# Patient Record
Sex: Female | Born: 1965
Health system: Southern US, Community
[De-identification: ages and names within clinical notes are randomized; demographics above are authoritative.]

## PROBLEM LIST (undated history)

## (undated) DIAGNOSIS — I10 Essential (primary) hypertension: Secondary | ICD-10-CM

## (undated) DIAGNOSIS — K641 Second degree hemorrhoids: Secondary | ICD-10-CM

## (undated) DIAGNOSIS — N179 Acute kidney failure, unspecified: Secondary | ICD-10-CM

## (undated) DIAGNOSIS — T7840XA Allergy, unspecified, initial encounter: Secondary | ICD-10-CM

## (undated) DIAGNOSIS — F17210 Nicotine dependence, cigarettes, uncomplicated: Secondary | ICD-10-CM

## (undated) HISTORY — PX: MASTECTOMY: SHX3

## (undated) HISTORY — DX: Nicotine dependence, cigarettes, uncomplicated: F17.210

## (undated) HISTORY — DX: Essential (primary) hypertension: I10

## (undated) HISTORY — DX: Second degree hemorrhoids: K64.1

## (undated) HISTORY — DX: Acute kidney failure, unspecified: N17.9

## (undated) HISTORY — DX: Allergy, unspecified, initial encounter: T78.40XA

---

## 1992-12-19 HISTORY — PX: TUBAL LIGATION: SHX77

## 2007-04-19 ENCOUNTER — Ambulatory Visit: Payer: Self-pay | Admitting: Family Medicine

## 2011-01-13 ENCOUNTER — Ambulatory Visit: Payer: Self-pay | Admitting: Unknown Physician Specialty

## 2013-06-10 ENCOUNTER — Ambulatory Visit: Payer: Self-pay | Admitting: Obstetrics and Gynecology

## 2013-07-19 LAB — HM PAP SMEAR: HM PAP: NORMAL

## 2013-07-19 LAB — HM MAMMOGRAPHY: HM MAMMO: NORMAL

## 2014-07-30 ENCOUNTER — Ambulatory Visit: Payer: Self-pay | Admitting: Obstetrics and Gynecology

## 2015-06-29 ENCOUNTER — Ambulatory Visit (INDEPENDENT_AMBULATORY_CARE_PROVIDER_SITE_OTHER): Payer: 59

## 2015-06-29 DIAGNOSIS — Z3042 Encounter for surveillance of injectable contraceptive: Secondary | ICD-10-CM | POA: Diagnosis not present

## 2015-06-29 MED ORDER — MEDROXYPROGESTERONE ACETATE 150 MG/ML IM SUSP
150.0000 mg | Freq: Once | INTRAMUSCULAR | Status: AC
  Administered 2015-06-29: 150 mg via INTRAMUSCULAR

## 2015-09-29 ENCOUNTER — Ambulatory Visit (INDEPENDENT_AMBULATORY_CARE_PROVIDER_SITE_OTHER): Payer: 59 | Admitting: Family Medicine

## 2015-09-29 DIAGNOSIS — Z3049 Encounter for surveillance of other contraceptives: Secondary | ICD-10-CM

## 2015-09-29 DIAGNOSIS — Z3042 Encounter for surveillance of injectable contraceptive: Secondary | ICD-10-CM

## 2015-09-29 MED ORDER — MEDROXYPROGESTERONE ACETATE 150 MG/ML IM SUSP
150.0000 mg | Freq: Once | INTRAMUSCULAR | Status: AC
  Administered 2015-09-29: 150 mg via INTRAMUSCULAR

## 2015-10-13 ENCOUNTER — Encounter: Payer: Self-pay | Admitting: Family Medicine

## 2015-10-13 ENCOUNTER — Telehealth: Payer: Self-pay | Admitting: Family Medicine

## 2015-10-13 ENCOUNTER — Ambulatory Visit (INDEPENDENT_AMBULATORY_CARE_PROVIDER_SITE_OTHER): Payer: 59 | Admitting: Family Medicine

## 2015-10-13 ENCOUNTER — Other Ambulatory Visit: Payer: Self-pay | Admitting: Family Medicine

## 2015-10-13 ENCOUNTER — Encounter: Payer: Self-pay | Admitting: Obstetrics and Gynecology

## 2015-10-13 VITALS — BP 138/90 | HR 94 | Temp 97.8°F | Resp 16 | Ht 68.0 in | Wt 134.4 lb

## 2015-10-13 DIAGNOSIS — Z3042 Encounter for surveillance of injectable contraceptive: Secondary | ICD-10-CM | POA: Diagnosis not present

## 2015-10-13 DIAGNOSIS — F17210 Nicotine dependence, cigarettes, uncomplicated: Secondary | ICD-10-CM | POA: Insufficient documentation

## 2015-10-13 DIAGNOSIS — I10 Essential (primary) hypertension: Secondary | ICD-10-CM

## 2015-10-13 DIAGNOSIS — Z8742 Personal history of other diseases of the female genital tract: Secondary | ICD-10-CM | POA: Insufficient documentation

## 2015-10-13 DIAGNOSIS — J309 Allergic rhinitis, unspecified: Secondary | ICD-10-CM | POA: Insufficient documentation

## 2015-10-13 NOTE — Telephone Encounter (Signed)
1) Call [facility] and see if they have any blood work on her, last PAP test results, last mammogram results.   2) Call [facility] and get last mammogram result report.

## 2015-10-13 NOTE — Progress Notes (Signed)
Name: Joy Ray   MRN: [id number]    DOB: [birth year]   Date:10/13/2015       Progress Note  Subjective  Chief Complaint  Chief Complaint  Patient presents with  . Hypertension    HPI  Joy Ray is a 49 year old female who was initially here for a physical exam however it was changed to a routine follow up visit as she was under the impression she did not need a PAP test (last done 07/2013) and mammogram (last done 2015 and she states [facility] told her she can do another one in 3 years). She reports having blood work done maybe through [facility] but I can not find records in [place] or [facility]. Otherwise she reports using her HTN medication Lisinopril 40mg  a day with no problems and is still using Depo Provera every 3 months with no problems. Down to smoking about 3-5 cigarettes a day.  Past Medical History  Diagnosis Date  . HTN, goal below 140/90   . Allergy   . Cigarette smoker     Patient Active Problem List   Diagnosis Date Noted  . Allergic rhinitis 10/13/2015  . Cigarette smoker 10/13/2015  . H/O abnormal cervical Papanicolaou smear 10/13/2015  . Hypertension goal BP (blood pressure) < 140/90 10/13/2015  . Encounter for surveillance of injectable contraceptive 10/13/2015    Social History  Substance Use Topics  . Smoking status: Current Every Day Smoker -- 0.50 packs/day    Types: Cigarettes  . Smokeless tobacco: Not on file     Comment: has smoked for more than 10 yrs  . Alcohol Use: 0.0 oz/week    0 Standard drinks or equivalent per week     Comment: occasional glass of wine     Current outpatient prescriptions:  .  lisinopril (PRINIVIL,ZESTRIL) 40 MG tablet, Take 1 tablet by mouth daily., Disp: , Rfl:  .  medroxyPROGESTERone (DEPO-PROVERA) 150 MG/ML injection, Inject 150 mg into the muscle every 3 (three) months., Disp: , Rfl:   Past Surgical History  Procedure Laterality Date  . Tubal ligation  1994    After G1P1001     Family History  Problem Relation Age of Onset  . Cancer Mother     breast    Allergies  Allergen Reactions  . Ciprofloxacin      Review of Systems  CONSTITUTIONAL: No significant weight changes, fever, chills, weakness or fatigue.  HEENT:  - Eyes: No visual changes.  - Ears: No auditory changes. No pain.  - Nose: No sneezing, congestion, runny nose. - Throat: No sore throat. No changes in swallowing. SKIN: No rash or itching.  CARDIOVASCULAR: No chest pain, chest pressure or chest discomfort. No palpitations or edema.  RESPIRATORY: No shortness of breath, cough or sputum.  GASTROINTESTINAL: No anorexia, nausea, vomiting. No changes in bowel habits. No abdominal pain or blood.  NEUROLOGICAL: No headache, dizziness, syncope, paralysis, ataxia, numbness or tingling in the extremities. No memory changes. No change in bowel or bladder control.  PSYCHIATRIC: No change in mood. No change in sleep pattern.  ENDOCRINOLOGIC: No reports of sweating, cold or heat intolerance. No polyuria or polydipsia.     Objective  BP 138/90 mmHg  Pulse 94  Temp(Src) 97.8 F (36.6 C) (Oral)  Resp 16  Ht 5\' 8"  (1.727 m)  Wt 134 lb 6.4 oz (60.963 kg)  BMI 20.44 kg/m2  SpO2 96% Body mass index is 20.44 kg/(m^2).  Physical Exam  Constitutional: Patient appears well-developed  and well-nourished. In no distress.  HEENT:  - Head: Normocephalic and atraumatic.  - Ears: Bilateral TMs gray, no erythema or effusion - Nose: Nasal mucosa moist - Mouth/Throat: Oropharynx is clear and moist. No tonsillar hypertrophy or erythema. No post nasal drainage.  - Eyes: Conjunctivae clear, EOM movements normal. PERRLA. No scleral icterus.  Neck: Normal range of motion. Neck supple. No JVD present. No thyromegaly present.  Cardiovascular: Normal rate, regular rhythm and normal heart sounds.  No murmur heard.  Pulmonary/Chest: Effort normal and breath sounds normal. No respiratory distress. Musculoskeletal:  Normal range of motion bilateral UE and LE, no joint effusions.  Psychiatric: Patient has a guarded mood and affect. Behavior is normal in office today. Judgment and thought content normal in office today.   Assessment & Plan  1. Hypertension goal BP (blood pressure) < [id number] Diastolic borderline but she is hesitant to start any more medication. If I can not find evidence of blood work done in the past 1 year I will order near future.  2. Encounter for surveillance of injectable contraceptive Doing well.

## 2015-10-13 NOTE — Telephone Encounter (Signed)
Contacted [facility] to see if they had any updated information on this patient but i was on hold for 16 minutes and was not able to speak with anyone. I will try again tomorrow.

## 2015-10-14 NOTE — Telephone Encounter (Signed)
Spoke with Joy Ray with [facility] to see if they had any recent labs, pap results or mammogram. She informed me that this patient had an appt on 09/20/15 to have her annual CPE that she cancelled and that she declined to have her PAP on 10/09/2014. She has had blood work on 09/03/13, 10/08/13, 10/21/13 & 05/22/14.  Joy Ray stated that she will fax a copy of her results to our office.

## 2015-10-15 NOTE — Telephone Encounter (Signed)
Spoke with Chanel at [facility] and she stated that this patient had a mammogram August of 2015. She was not able to print it off so she transferred me to HIM. I then spoke with Denman George and she said that I had to fax a her this patient's name and date of birth on our letterhead to [phone] in order for it to be released. The task was done as requested.

## 2015-10-16 ENCOUNTER — Telehealth: Payer: Self-pay | Admitting: Family Medicine

## 2015-10-16 NOTE — Telephone Encounter (Signed)
Clarification she had a PAP test 08/2013 (just got records from [facility]) so she should only need updated blood work and mammogram if she is okay with me ordering these give her number to [facility] to schedule her mammogram.

## 2015-10-16 NOTE — Telephone Encounter (Signed)
Please speak with patient and let her know that I recommend that she get updated blood work, mammogram and PAP testing done. I can order blood work and mammogram but she will have to return for PAP only office visit. Let me know if I can go ahead and order lab work and mammogram?  Last blood work: 05/22/14 Last pap unknown according to [place] office since she declined to have her PAP on 10/09/2014  Last mammogram: Aug 2015 (waiting on results.)

## 2015-10-16 NOTE — Telephone Encounter (Signed)
Tried to contact this patient to inform her that we contacted both [facility] and [facility] in which they verified that she is not up to date with her lab work, mammogram or PAP, but there was no answer. The phone rang continuously so I was not able to leave a message.  When she calls [place] back I will inform her that Dr. Allie Dimmer is requesting that she comes in for a PAP only visit and that she wants to know if she could put in an order for a mammogram & labs.

## 2015-10-19 NOTE — Telephone Encounter (Signed)
I have been unable to get in contact with this patient, but as soon as she returns my call, I will review Dr. Allie Dimmer message.

## 2015-12-30 ENCOUNTER — Other Ambulatory Visit: Payer: Self-pay

## 2015-12-30 ENCOUNTER — Ambulatory Visit (INDEPENDENT_AMBULATORY_CARE_PROVIDER_SITE_OTHER): Payer: 59

## 2015-12-30 ENCOUNTER — Ambulatory Visit: Payer: 59

## 2015-12-30 DIAGNOSIS — Z3049 Encounter for surveillance of other contraceptives: Secondary | ICD-10-CM

## 2015-12-30 MED ORDER — MEDROXYPROGESTERONE ACETATE 150 MG/ML IM SUSP
150.0000 mg | Freq: Once | INTRAMUSCULAR | Status: AC
  Administered 2015-12-30: 150 mg via INTRAMUSCULAR

## 2016-01-05 ENCOUNTER — Other Ambulatory Visit: Payer: Self-pay | Admitting: Family Medicine

## 2016-01-05 DIAGNOSIS — Z1231 Encounter for screening mammogram for malignant neoplasm of breast: Secondary | ICD-10-CM

## 2016-01-05 MED ORDER — MEDROXYPROGESTERONE ACETATE 150 MG/ML IM SUSP: 150.0000 mg | INTRAMUSCULAR | Status: DC

## 2016-01-13 ENCOUNTER — Ambulatory Visit
Admission: RE | Admit: 2016-01-13 | Discharge: 2016-01-13 | Disposition: A | Discharge status: Home / Self Care | Payer: 59 | Source: Ambulatory Visit | Attending: Family Medicine | Admitting: Family Medicine

## 2016-01-13 DIAGNOSIS — Z1231 Encounter for screening mammogram for malignant neoplasm of breast: Secondary | ICD-10-CM | POA: Diagnosis present

## 2016-01-19 ENCOUNTER — Encounter: Payer: 59 | Admitting: Family Medicine

## 2016-02-01 ENCOUNTER — Encounter: Payer: 59 | Admitting: Family Medicine

## 2016-02-01 ENCOUNTER — Encounter: Payer: Self-pay | Admitting: Family Medicine

## 2016-02-01 ENCOUNTER — Ambulatory Visit (INDEPENDENT_AMBULATORY_CARE_PROVIDER_SITE_OTHER): Payer: 59

## 2016-02-01 VITALS — BP 136/84 | HR 109 | Temp 97.9°F | Resp 14 | Ht 68.0 in | Wt 131.0 lb

## 2016-02-01 DIAGNOSIS — Z23 Encounter for immunization: Secondary | ICD-10-CM

## 2016-02-02 DIAGNOSIS — Z0000 Encounter for general adult medical examination without abnormal findings: Secondary | ICD-10-CM | POA: Insufficient documentation

## 2016-02-02 DIAGNOSIS — Z23 Encounter for immunization: Secondary | ICD-10-CM | POA: Insufficient documentation

## 2016-02-02 NOTE — Progress Notes (Signed)
Patient left appointment, did not have time.

## 2016-03-23 ENCOUNTER — Other Ambulatory Visit: Payer: Self-pay | Admitting: Family Medicine

## 2016-03-23 MED ORDER — MEDROXYPROGESTERONE ACETATE 150 MG/ML IM SUSP: 150.0000 mg | INTRAMUSCULAR | Status: DC

## 2016-03-23 NOTE — Telephone Encounter (Signed)
Patient has appointment next week to get her Depo, however, she is needing a refill on that prescription. Please send to [facility]. Patient also scheduled her annual physical for later this month. She is requesting that you give her a call that her prescription has been sent to the pharmacy [phone]

## 2016-03-23 NOTE — Telephone Encounter (Signed)
Rx sent as requested; left msg on voicemail that Rx has been sent

## 2016-03-24 ENCOUNTER — Other Ambulatory Visit: Payer: Self-pay

## 2016-03-24 NOTE — Telephone Encounter (Signed)
I already approved the Depo; please verify with pharmacy

## 2016-03-29 ENCOUNTER — Ambulatory Visit (INDEPENDENT_AMBULATORY_CARE_PROVIDER_SITE_OTHER): Payer: 59

## 2016-03-29 DIAGNOSIS — Z3049 Encounter for surveillance of other contraceptives: Secondary | ICD-10-CM

## 2016-03-29 DIAGNOSIS — Z3042 Encounter for surveillance of injectable contraceptive: Secondary | ICD-10-CM

## 2016-03-29 MED ORDER — MEDROXYPROGESTERONE ACETATE 150 MG/ML IM SUSP
150.0000 mg | Freq: Once | INTRAMUSCULAR | Status: AC
  Administered 2016-03-29: 150 mg via INTRAMUSCULAR

## 2016-04-05 ENCOUNTER — Ambulatory Visit (INDEPENDENT_AMBULATORY_CARE_PROVIDER_SITE_OTHER): Payer: 59 | Admitting: Family Medicine

## 2016-04-05 ENCOUNTER — Encounter: Payer: Self-pay | Admitting: Family Medicine

## 2016-04-05 VITALS — BP 130/72 | HR 88 | Temp 98.9°F | Resp 16 | Ht 68.0 in | Wt 134.0 lb

## 2016-04-05 DIAGNOSIS — Z0000 Encounter for general adult medical examination without abnormal findings: Secondary | ICD-10-CM | POA: Diagnosis not present

## 2016-04-05 DIAGNOSIS — Z13820 Encounter for screening for osteoporosis: Secondary | ICD-10-CM

## 2016-04-05 DIAGNOSIS — J301 Allergic rhinitis due to pollen: Secondary | ICD-10-CM

## 2016-04-05 DIAGNOSIS — Z720 Tobacco use: Secondary | ICD-10-CM | POA: Diagnosis not present

## 2016-04-05 DIAGNOSIS — Z1211 Encounter for screening for malignant neoplasm of colon: Secondary | ICD-10-CM | POA: Insufficient documentation

## 2016-04-05 DIAGNOSIS — F17210 Nicotine dependence, cigarettes, uncomplicated: Secondary | ICD-10-CM

## 2016-04-05 DIAGNOSIS — I10 Essential (primary) hypertension: Secondary | ICD-10-CM | POA: Diagnosis not present

## 2016-04-05 DIAGNOSIS — Z124 Encounter for screening for malignant neoplasm of cervix: Secondary | ICD-10-CM

## 2016-04-05 DIAGNOSIS — Z8742 Personal history of other diseases of the female genital tract: Secondary | ICD-10-CM | POA: Diagnosis not present

## 2016-04-05 DIAGNOSIS — N898 Other specified noninflammatory disorders of vagina: Secondary | ICD-10-CM

## 2016-04-05 MED ORDER — FLUTICASONE PROPIONATE 50 MCG/ACT NA SUSP: 2.0000 | Freq: Every day | NASAL | Status: DC

## 2016-04-05 NOTE — Assessment & Plan Note (Addendum)
Encouraged quitting; see AVS

## 2016-04-05 NOTE — Patient Instructions (Addendum)
I do encourage you to quit smoking Call [phone] to sign up for smoking cessation classes You can call 1-800-QUIT-NOW to talk with a smoking cessation coach Return for next Depo 13 weeks after the last one  Smoking Hazards Smoking cigarettes is extremely bad for your health. Tobacco smoke has over 200 known poisons in it. It contains the poisonous gases nitrogen oxide and carbon monoxide. There are over 60 chemicals in tobacco smoke that cause cancer. Some of the chemicals found in cigarette smoke include:   Cyanide.   Benzene.   Formaldehyde.   Methanol (wood alcohol).   Acetylene (fuel used in welding torches).   Ammonia.  Even smoking lightly shortens your life expectancy by several years. You can greatly reduce the risk of medical problems for you and your family by stopping now. Smoking is the most preventable cause of death and disease in our society. Within days of quitting smoking, your circulation improves, you decrease the risk of having a heart attack, and your lung capacity improves. There may be some increased phlegm in the first few days after quitting, and it may take months for your lungs to clear up completely. Quitting for 10 years reduces your risk of developing lung cancer to almost that of a nonsmoker.  WHAT ARE THE RISKS OF SMOKING? Cigarette smokers have an increased risk of many serious medical problems, including:  Lung cancer.   Lung disease (such as pneumonia, bronchitis, and emphysema).   Heart attack and chest pain due to the heart not getting enough oxygen (angina).   Heart disease and peripheral blood vessel disease.   Hypertension.   Stroke.   Oral cancer (cancer of the lip, mouth, or voice box).   Bladder cancer.   Pancreatic cancer.   Cervical cancer.   Pregnancy complications, including premature birth.   Stillbirths and smaller newborn babies, birth defects, and genetic damage to sperm.   Early menopause.    Lower estrogen level for women.   Infertility.   Facial wrinkles.   Blindness.   Increased risk of broken bones (fractures).   Senile dementia.   Stomach ulcers and internal bleeding.   Delayed wound healing and increased risk of complications during surgery. Because of secondhand smoke exposure, children of smokers have an increased risk of the following:   Sudden infant death syndrome (SIDS).   Respiratory infections.   Lung cancer.   Heart disease.   Ear infections.  WHY IS SMOKING ADDICTIVE? Nicotine is the chemical agent in tobacco that is capable of causing addiction or dependence. When you smoke and inhale, nicotine is absorbed rapidly into the bloodstream through your lungs. Both inhaled and noninhaled nicotine may be addictive.  WHAT ARE THE BENEFITS OF QUITTING?  There are many health benefits to quitting smoking. Some are:   The likelihood of developing cancer and heart disease decreases. Health improvements are seen almost immediately.   Blood pressure, pulse rate, and breathing patterns start returning to normal soon after quitting.   People who quit may see an improvement in their overall quality of life.  HOW DO YOU QUIT SMOKING? Smoking is an addiction with both physical and psychological effects, and longtime habits can be hard to change. Your health care provider can recommend:  Programs and community resources, which may include group support, education, or therapy.  Replacement products, such as patches, gum, and nasal sprays. Use these products only as directed. Do not replace cigarette smoking with electronic cigarettes (commonly called e-cigarettes). The safety of e-cigarettes is  unknown, and some may contain harmful chemicals. FOR MORE INFORMATION  American Lung Association: www.lung.org  American Cancer Society: www.cancer.org   This information is not intended to replace advice given to you by your health care provider.  Make sure you discuss any questions you have with your health care provider.   Document Released: 01/12/2005 Document Revised: 09/25/2013 Document Reviewed: 05/27/2013 Elsevier Interactive Patient Education 2016 [employer]. Smoking Cessation, Tips for Success If you are ready to quit smoking, congratulations! You have chosen to help yourself be healthier. Cigarettes bring nicotine, tar, carbon monoxide, and other irritants into your body. Your lungs, heart, and blood vessels will be able to work better without these poisons. There are many different ways to quit smoking. Nicotine gum, nicotine patches, a nicotine inhaler, or nicotine nasal spray can help with physical craving. Hypnosis, support groups, and medicines help break the habit of smoking. WHAT THINGS CAN I DO TO MAKE QUITTING EASIER?  Here are some tips to help you quit for good:  Pick a date when you will quit smoking completely. Tell all of your friends and family about your plan to quit on that date.  Do not try to slowly cut down on the number of cigarettes you are smoking. Pick a quit date and quit smoking completely starting on that day.  Throw away all cigarettes.   Clean and remove all ashtrays from your home, work, and car.  On a card, write down your reasons for quitting. Carry the card with you and read it when you get the urge to smoke.  Cleanse your body of nicotine. Drink enough water and fluids to keep your urine clear or pale yellow. Do this after quitting to flush the nicotine from your body.  Learn to predict your moods. Do not let a bad situation be your excuse to have a cigarette. Some situations in your life might tempt you into wanting a cigarette.  Never have "just one" cigarette. It leads to wanting another and another. Remind yourself of your decision to quit.  Change habits associated with smoking. If you smoked while driving or when feeling stressed, try other activities to replace smoking. Stand up  when drinking your coffee. Brush your teeth after eating. Sit in a different chair when you read the paper. Avoid alcohol while trying to quit, and try to drink fewer caffeinated beverages. Alcohol and caffeine may urge you to smoke.  Avoid foods and drinks that can trigger a desire to smoke, such as sugary or spicy foods and alcohol.  Ask people who smoke not to smoke around you.  Have something planned to do right after eating or having a cup of coffee. For example, plan to take a walk or exercise.  Try a relaxation exercise to calm you down and decrease your stress. Remember, you may be tense and nervous for the first 2 weeks after you quit, but this will pass.  Find new activities to keep your hands busy. Play with a pen, coin, or rubber band. Doodle or draw things on paper.  Brush your teeth right after eating. This will help cut down on the craving for the taste of tobacco after meals. You can also try mouthwash.   Use oral substitutes in place of cigarettes. Try using lemon drops, carrots, cinnamon sticks, or chewing gum. Keep them handy so they are available when you have the urge to smoke.  When you have the urge to smoke, try deep breathing.  Designate your home as a  nonsmoking area.  If you are a heavy smoker, ask your health care provider about a prescription for nicotine chewing gum. It can ease your withdrawal from nicotine.  Reward yourself. Set aside the cigarette money you save and buy yourself something nice.  Look for support from others. Join a support group or smoking cessation program. Ask someone at home or at work to help you with your plan to quit smoking.  Always ask yourself, "Do I need this cigarette or is this just a reflex?" Tell yourself, "Today, I choose not to smoke," or "I do not want to smoke." You are reminding yourself of your decision to quit.  Do not replace cigarette smoking with electronic cigarettes (commonly called e-cigarettes). The safety of  e-cigarettes is unknown, and some may contain harmful chemicals.  If you relapse, do not give up! Plan ahead and think about what you will do the next time you get the urge to smoke. HOW WILL I FEEL WHEN I QUIT SMOKING? You may have symptoms of withdrawal because your body is used to nicotine (the addictive substance in cigarettes). You may crave cigarettes, be irritable, feel very hungry, cough often, get headaches, or have difficulty concentrating. The withdrawal symptoms are only temporary. They are strongest when you first quit but will go away within 10-14 days. When withdrawal symptoms occur, stay in control. Think about your reasons for quitting. Remind yourself that these are signs that your body is healing and getting used to being without cigarettes. Remember that withdrawal symptoms are easier to treat than the major diseases that smoking can cause.  Even after the withdrawal is over, expect periodic urges to smoke. However, these cravings are generally short lived and will go away whether you smoke or not. Do not smoke! WHAT RESOURCES ARE AVAILABLE TO HELP ME QUIT SMOKING? Your health care provider can direct you to community resources or hospitals for support, which may include:  Group support.  Education.  Hypnosis.  Therapy.   This information is not intended to replace advice given to you by your health care provider. Make sure you discuss any questions you have with your health care provider.   Document Released: 09/02/2004 Document Revised: 12/26/2014 Document Reviewed: 05/23/2013 Elsevier Interactive Patient Education 2016 [place] Maintenance, Female Adopting a healthy lifestyle and getting preventive care can go a long way to promote health and wellness. Talk with your health care provider about what schedule of regular examinations is right for you. This is a good chance for you to check in with your provider about disease prevention and staying healthy. In  between checkups, there are plenty of things you can do on your own. Experts have done a lot of research about which lifestyle changes and preventive measures are most likely to keep you healthy. Ask your health care provider for more information. WEIGHT AND DIET  Eat a healthy diet  Be sure to include plenty of vegetables, fruits, low-fat dairy products, and lean protein.  Do not eat a lot of foods high in solid fats, added sugars, or salt.  Get regular exercise. This is one of the most important things you can do for your health.  Most adults should exercise for at least 150 minutes each week. The exercise should increase your heart rate and make you sweat (moderate-intensity exercise).  Most adults should also do strengthening exercises at least twice a week. This is in addition to the moderate-intensity exercise.  Maintain a healthy weight  Body mass index (  BMI) is a measurement that can be used to identify possible weight problems. It estimates body fat based on height and weight. Your health care provider can help determine your BMI and help you achieve or maintain a healthy weight.  For females 69 years of age and older:   A BMI below 18.5 is considered underweight.  A BMI of 18.5 to 24.9 is normal.  A BMI of 25 to 29.9 is considered overweight.  A BMI of 30 and above is considered obese.  Watch levels of cholesterol and blood lipids  You should start having your blood tested for lipids and cholesterol at 48 years of age, then have this test every 5 years.  You may need to have your cholesterol levels checked more often if:  Your lipid or cholesterol levels are high.  You are older than 48 years of age.  You are at high risk for heart disease.  CANCER SCREENING   Lung Cancer  Lung cancer screening is recommended for adults 70-87 years old who are at high risk for lung cancer because of a history of smoking.  A yearly low-dose CT scan of the lungs is recommended  for people who:  Currently smoke.  Have quit within the past 15 years.  Have at least a 30-pack-year history of smoking. A pack year is smoking an average of one pack of cigarettes a day for 1 year.  Yearly screening should continue until it has been 15 years since you quit.  Yearly screening should stop if you develop a health problem that would prevent you from having lung cancer treatment.  Breast Cancer  Practice breast self-awareness. This means understanding how your breasts normally appear and feel.  It also means doing regular breast self-exams. Let your health care provider know about any changes, no matter how small.  If you are in your 20s or 30s, you should have a clinical breast exam (CBE) by a health care provider every 1-3 years as part of a regular health exam.  If you are 13 or older, have a CBE every year. Also consider having a breast X-ray (mammogram) every year.  If you have a family history of breast cancer, talk to your health care provider about genetic screening.  If you are at high risk for breast cancer, talk to your health care provider about having an MRI and a mammogram every year.  Breast cancer gene (BRCA) assessment is recommended for women who have family members with BRCA-related cancers. BRCA-related cancers include:  Breast.  Ovarian.  Tubal.  Peritoneal cancers.  Results of the assessment will determine the need for genetic counseling and BRCA1 and BRCA2 testing. Cervical Cancer Your health care provider may recommend that you be screened regularly for cancer of the pelvic organs (ovaries, uterus, and vagina). This screening involves a pelvic examination, including checking for microscopic changes to the surface of your cervix (Pap test). You may be encouraged to have this screening done every 3 years, beginning at age 65.  For women ages 27-65, health care providers may recommend pelvic exams and Pap testing every 3 years, or they may  recommend the Pap and pelvic exam, combined with testing for human papilloma virus (HPV), every 5 years. Some types of HPV increase your risk of cervical cancer. Testing for HPV may also be done on women of any age with unclear Pap test results.  Other health care providers may not recommend any screening for nonpregnant women who are considered low risk for  pelvic cancer and who do not have symptoms. Ask your health care provider if a screening pelvic exam is right for you.  If you have had past treatment for cervical cancer or a condition that could lead to cancer, you need Pap tests and screening for cancer for at least 20 years after your treatment. If Pap tests have been discontinued, your risk factors (such as having a new sexual partner) need to be reassessed to determine if screening should resume. Some women have medical problems that increase the chance of getting cervical cancer. In these cases, your health care provider may recommend more frequent screening and Pap tests. Colorectal Cancer  This type of cancer can be detected and often prevented.  Routine colorectal cancer screening usually begins at 48 years of age and continues through 48 years of age.  Your health care provider may recommend screening at an earlier age if you have risk factors for colon cancer.  Your health care provider may also recommend using home test kits to check for hidden blood in the stool.  A small camera at the end of a tube can be used to examine your colon directly (sigmoidoscopy or colonoscopy). This is done to check for the earliest forms of colorectal cancer.  Routine screening usually begins at age 66.  Direct examination of the colon should be repeated every 5-10 years through 48 years of age. However, you may need to be screened more often if early forms of precancerous polyps or small growths are found. Skin Cancer  Check your skin from head to toe regularly.  Tell your health care provider  about any new moles or changes in moles, especially if there is a change in a mole's shape or color.  Also tell your health care provider if you have a mole that is larger than the size of a pencil eraser.  Always use sunscreen. Apply sunscreen liberally and repeatedly throughout the day.  Protect yourself by wearing long sleeves, pants, a wide-brimmed hat, and sunglasses whenever you are outside. HEART DISEASE, DIABETES, AND HIGH BLOOD PRESSURE   High blood pressure causes heart disease and increases the risk of stroke. High blood pressure is more likely to develop in:  People who have blood pressure in the high end of the normal range (130-139/85-89 mm Hg).  People who are overweight or obese.  People who are African American.  If you are 30-38 years of age, have your blood pressure checked every 3-5 years. If you are 42 years of age or older, have your blood pressure checked every year. You should have your blood pressure measured twice--once when you are at a hospital or clinic, and once when you are not at a hospital or clinic. Record the average of the two measurements. To check your blood pressure when you are not at a hospital or clinic, you can use:  An automated blood pressure machine at a pharmacy.  A home blood pressure monitor.  If you are between 56 years and 52 years old, ask your health care provider if you should take aspirin to prevent strokes.  Have regular diabetes screenings. This involves taking a blood sample to check your fasting blood sugar level.  If you are at a normal weight and have a low risk for diabetes, have this test once every three years after 48 years of age.  If you are overweight and have a high risk for diabetes, consider being tested at a younger age or more often. PREVENTING INFECTION  Hepatitis B  If you have a higher risk for hepatitis B, you should be screened for this virus. You are considered at high risk for hepatitis B if:  You were  born in a country where hepatitis B is common. Ask your health care provider which countries are considered high risk.  Your parents were born in a high-risk country, and you have not been immunized against hepatitis B (hepatitis B vaccine).  You have HIV or AIDS.  You use needles to inject street drugs.  You live with someone who has hepatitis B.  You have had sex with someone who has hepatitis B.  You get hemodialysis treatment.  You take certain medicines for conditions, including cancer, organ transplantation, and autoimmune conditions. Hepatitis C  Blood testing is recommended for:  Everyone born from [birth year] through 1963.  Anyone with known risk factors for hepatitis C. Sexually transmitted infections (STIs)  You should be screened for sexually transmitted infections (STIs) including gonorrhea and chlamydia if:  You are sexually active and are younger than 48 years of age.  You are older than 48 years of age and your health care provider tells you that you are at risk for this type of infection.  Your sexual activity has changed since you were last screened and you are at an increased risk for chlamydia or gonorrhea. Ask your health care provider if you are at risk.  If you do not have HIV, but are at risk, it may be recommended that you take a prescription medicine daily to prevent HIV infection. This is called pre-exposure prophylaxis (PrEP). You are considered at risk if:  You are sexually active and do not regularly use condoms or know the HIV status of your partner(s).  You take drugs by injection.  You are sexually active with a partner who has HIV. Talk with your health care provider about whether you are at high risk of being infected with HIV. If you choose to begin PrEP, you should first be tested for HIV. You should then be tested every 3 months for as long as you are taking PrEP.  PREGNANCY   If you are premenopausal and you may become pregnant, ask your  health care provider about preconception counseling.  If you may become pregnant, take 400 to 800 micrograms (mcg) of folic acid every day.  If you want to prevent pregnancy, talk to your health care provider about birth control (contraception). OSTEOPOROSIS AND MENOPAUSE   Osteoporosis is a disease in which the bones lose minerals and strength with aging. This can result in serious bone fractures. Your risk for osteoporosis can be identified using a bone density scan.  If you are 16 years of age or older, or if you are at risk for osteoporosis and fractures, ask your health care provider if you should be screened.  Ask your health care provider whether you should take a calcium or vitamin D supplement to lower your risk for osteoporosis.  Menopause may have certain physical symptoms and risks.  Hormone replacement therapy may reduce some of these symptoms and risks. Talk to your health care provider about whether hormone replacement therapy is right for you.  HOME CARE INSTRUCTIONS   Schedule regular health, dental, and eye exams.  Stay current with your immunizations.   Do not use any tobacco products including cigarettes, chewing tobacco, or electronic cigarettes.  If you are pregnant, do not drink alcohol.  If you are breastfeeding, limit how much and how often you drink  alcohol.  Limit alcohol intake to no more than 1 drink per day for nonpregnant women. One drink equals 12 ounces of beer, 5 ounces of wine, or 1 ounces of hard liquor.  Do not use street drugs.  Do not share needles.  Ask your health care provider for help if you need support or information about quitting drugs.  Tell your health care provider if you often feel depressed.  Tell your health care provider if you have ever been abused or do not feel safe at home.   This information is not intended to replace advice given to you by your health care provider. Make sure you discuss any questions you have with  your health care provider.   Document Released: 06/20/2011 Document Revised: 12/26/2014 Document Reviewed: 11/06/2013 Elsevier Interactive Patient Education [employer].

## 2016-04-05 NOTE — Progress Notes (Signed)
Patient ID: Joy Ray, female   DOB: January 26, 1966, 50 y.o.   MRN: [id number]   Subjective:   Joy Ray is a 50 y.o. female here for a complete physical exam  Patient is new to me; her usual provider left this practice; has Depo, last Feb 13th  USPSTF grade A and B recommendations Alcohol: less than 7 drinks per week Depression:  Depression screen [facility] 2/9 04/05/2016 02/01/2016 10/13/2015  Decreased Interest 0 0 0  Down, Depressed, Hopeless 0 0 0  PHQ - 2 Score 0 0 0   Hypertension: controlled Obesity: n/a Tobacco use: discussed  HIV: test today Lipids: test today Glucose: test today Colorectal cancer: refer for colonoscopy Breast cancer: just had mammogram BRCA gene screening: mother and aunt had breast cancer; no ovarian cancer; had the blood test for BRCA Intimate partner violence: n/a Cervical cancer screening: today; no hx of abnormal Lung cancer: n/a, yearly CT at 49 yo Osteoporosis: DEXA ordered today; on depo x 15 years, smoker AAA: n/a Aspirin: n/a Diet: good eater Exercise: works two jobs Skin cancer: no worrisome moles  Allergies have been bad recently; mostly spring; no major changes in home environment Doesn't sleep all night; may doze off 3 hours; wakes up; going on for two years; does have hot flashes; on Depo; no periods at all ; satisfied; no pelvic pain or bloating; just got mammogram; mother and aunt with breast cancer  Past Medical History  Diagnosis Date  . HTN, goal below 140/90   . Allergy   . Cigarette smoker    Past Surgical History  Procedure Laterality Date  . Tubal ligation  1994    After G1P1001  . Colonoscopy with propofol N/A 04/21/2016    Procedure: COLONOSCOPY WITH PROPOFOL;  Surgeon: Lucilla Lame, MD;  Location: [facility];  Service: Endoscopy;  Laterality: N/A;   Family History  Problem Relation Age of Onset  . Cancer Mother     breast  . Breast cancer Mother 40  . Breast cancer Maternal Aunt     Social History  Substance Use Topics  . Smoking status: Current Every Day Smoker -- 0.50 packs/day for 20 years    Types: Cigarettes  . Smokeless tobacco: Not on file     Comment: pt has cut back to 1 or 2 cigs/day  . Alcohol Use: 0.6 oz/week    0 Standard drinks or equivalent, 1 Glasses of wine per week     Comment:    can't smoke at work; 1/2 ppd; no one around smoking  Review of Systems  Objective:   Filed Vitals:   04/05/16 1424  BP: 130/72  Pulse: 88  Temp: 98.9 F (37.2 C)  TempSrc: Oral  Resp: 16  Height: '5\' 8"'  (1.727 m)  Weight: 134 lb (60.782 kg)  SpO2: 97%   Body mass index is 20.38 kg/(m^2). Wt Readings from Last 3 Encounters:  04/21/16 132 lb (59.875 kg)  04/05/16 134 lb (60.782 kg)  02/01/16 131 lb (59.421 kg)   Physical Exam  Constitutional: She appears well-developed and well-nourished.  HENT:  Head: Normocephalic and atraumatic.  Eyes: Conjunctivae and EOM are normal. Right eye exhibits no hordeolum. Left eye exhibits no hordeolum. No scleral icterus.  Neck: Carotid bruit is not present. No thyromegaly present.  Cardiovascular: Normal rate, regular rhythm, S1 normal, S2 normal and normal heart sounds.   No extrasystoles are present.  Pulmonary/Chest: Effort normal and breath sounds normal. No respiratory distress. Right breast exhibits no inverted  nipple, no mass, no nipple discharge, no skin change and no tenderness. Left breast exhibits no inverted nipple, no mass, no nipple discharge, no skin change and no tenderness. Breasts are symmetrical.  Abdominal: Soft. Normal appearance and bowel sounds are normal. She exhibits no distension, no abdominal bruit, no pulsatile midline mass and no mass. There is no hepatosplenomegaly. There is no tenderness. No hernia.  Genitourinary: Uterus normal. Pelvic exam was performed with patient prone. There is no rash or lesion on the right labia. There is no rash or lesion on the left labia. Cervix exhibits no motion  tenderness. Right adnexum displays no mass, no tenderness and no fullness. Left adnexum displays no mass, no tenderness and no fullness.  Musculoskeletal: Normal range of motion. She exhibits no edema.  Lymphadenopathy:       Head (right side): No submandibular adenopathy present.       Head (left side): No submandibular adenopathy present.    She has no cervical adenopathy.    She has no axillary adenopathy.  Neurological: She is alert. She displays no tremor. No cranial nerve deficit. She exhibits normal muscle tone. Gait normal.  Skin: Skin is warm and dry. No bruising and no ecchymosis noted. No cyanosis. No pallor.  Psychiatric: Her speech is normal and behavior is normal. Thought content normal. Her mood appears not anxious. She does not exhibit a depressed mood.    Assessment/Plan:   Problem List Items Addressed This Visit      Cardiovascular and Mediastinum   Hypertension goal BP (blood pressure) < 140/90    Controlled today; previous provider noted that she may have component of white coat HTN        Respiratory   Allergic rhinitis    Nasal spray        Other   Cigarette smoker    Encouraged quitting; see AVS      Colon cancer screening   Relevant Orders   Ambulatory referral to Gastroenterology   H/O abnormal cervical Papanicolaou smear    [facility]; 2014 pap smear normal; thin prep collected today with HPV co-testing      Preventative health care - Primary    USPSTF grade A and B recommendations reviewed with patient; age-appropriate recommendations, preventive care, screening tests, etc discussed and encouraged; healthy living encouraged; see AVS for patient education given to patient      Relevant Orders   CBC with Differential/Platelet (Completed)   Comprehensive metabolic panel (Completed)   Lipid Panel w/o Chol/HDL Ratio (Completed)   Screening for cervical cancer    Thin prep collected today      Relevant Orders   Pap  liquid-based and HPV (high risk) (Completed)    Other Visit Diagnoses    Osteoporosis screening        has used DEPO x 15 years, smoker; ordering dexa scan    Relevant Orders    DG Bone Density    Vaginal discharge           Meds ordered this encounter  Medications  . fluticasone (FLONASE) 50 MCG/ACT nasal spray    Sig: Place 2 sprays into both nostrils daily.    Dispense:  48 g    Refill:  3   Orders Placed This Encounter  Procedures  . DG Bone Density    Order Specific Question:  Reason for Exam (SYMPTOM  OR DIAGNOSIS REQUIRED)    Answer:  DEPO x 15+ years, smoker    Order Specific Question:  Preferred imaging location?    Answer:  [facility]    Order Specific Question:  Is the patient pregnant?    Answer:  No  . CBC with Differential/Platelet  . Comprehensive metabolic panel    Order Specific Question:  Has the patient fasted?    Answer:  Yes  . Lipid Panel w/o Chol/HDL Ratio    Order Specific Question:  Has the patient fasted?    Answer:  Yes  . Ambulatory referral to Gastroenterology    Referral Priority:  Routine    Referral Type:  Consultation    Referral Reason:  Specialty Services Required    Number of Visits Requested:  1    Follow up plan: Return in about 1 year (around 04/05/2017) for complete physical.  An after-visit summary was printed and given to the patient at [facility].  Please see the patient instructions which may contain other information and recommendations beyond what is mentioned above in the assessment and plan.

## 2016-04-05 NOTE — Assessment & Plan Note (Signed)
Nasal spray?

## 2016-04-06 LAB — CBC WITH DIFFERENTIAL/PLATELET
BASOS: 0 %
Basophils Absolute: 0 10*3/uL (ref 0.0–0.2)
EOS (ABSOLUTE): 0.1 10*3/uL (ref 0.0–0.4)
EOS: 1 %
HEMATOCRIT: 42.9 % (ref 34.0–46.6)
Hemoglobin: 14.4 g/dL (ref 11.1–15.9)
IMMATURE GRANS (ABS): 0 10*3/uL (ref 0.0–0.1)
IMMATURE GRANULOCYTES: 0 %
LYMPHS: 24 %
Lymphocytes Absolute: 1.8 10*3/uL (ref 0.7–3.1)
MCH: 33.5 pg — ABNORMAL HIGH (ref 26.6–33.0)
MCHC: 33.6 g/dL (ref 31.5–35.7)
MCV: 100 fL — AB (ref 79–97)
MONOCYTES: 4 %
Monocytes Absolute: 0.3 10*3/uL (ref 0.1–0.9)
NEUTROS PCT: 71 %
Neutrophils Absolute: 5.4 10*3/uL (ref 1.4–7.0)
PLATELETS: 288 10*3/uL (ref 150–379)
RBC: 4.3 x10E6/uL (ref 3.77–5.28)
RDW: 13.8 % (ref 12.3–15.4)
WBC: 7.6 10*3/uL (ref 3.4–10.8)

## 2016-04-06 LAB — COMPREHENSIVE METABOLIC PANEL
A/G RATIO: 1.7 (ref 1.2–2.2)
ALT: 7 IU/L (ref 0–32)
AST: 11 IU/L (ref 0–40)
Albumin: 4.3 g/dL (ref 3.5–5.5)
Alkaline Phosphatase: 58 IU/L (ref 39–117)
BUN/Creatinine Ratio: 9 (ref 9–23)
BUN: 8 mg/dL (ref 6–24)
Bilirubin Total: 0.3 mg/dL (ref 0.0–1.2)
CALCIUM: 9.3 mg/dL (ref 8.7–10.2)
CO2: 22 mmol/L (ref 18–29)
CREATININE: 0.86 mg/dL (ref 0.57–1.00)
Chloride: 101 mmol/L (ref 96–106)
GFR, EST AFRICAN AMERICAN: 92 mL/min/{1.73_m2} (ref >59)
GFR, EST NON AFRICAN AMERICAN: 80 mL/min/{1.73_m2} (ref >59)
GLOBULIN, TOTAL: 2.6 g/dL (ref 1.5–4.5)
Glucose: 95 mg/dL (ref 65–99)
POTASSIUM: 4.2 mmol/L (ref 3.5–5.2)
Sodium: 139 mmol/L (ref 134–144)
TOTAL PROTEIN: 6.9 g/dL (ref 6.0–8.5)

## 2016-04-06 LAB — LIPID PANEL W/O CHOL/HDL RATIO
Cholesterol, Total: 174 mg/dL (ref 100–199)
HDL: 64 mg/dL (ref >39)
LDL CALC: 85 mg/dL (ref 0–99)
Triglycerides: 127 mg/dL (ref 0–149)
VLDL Cholesterol Cal: 25 mg/dL (ref 5–40)

## 2016-04-09 LAB — PAP LB AND HPV HIGH-RISK
HPV, HIGH-RISK: NEGATIVE
PAP SMEAR COMMENT: 0

## 2016-04-11 ENCOUNTER — Other Ambulatory Visit: Payer: Self-pay

## 2016-04-11 ENCOUNTER — Telehealth: Payer: Self-pay

## 2016-04-11 DIAGNOSIS — Z1211 Encounter for screening for malignant neoplasm of colon: Secondary | ICD-10-CM

## 2016-04-11 NOTE — Telephone Encounter (Signed)
Gastroenterology Pre-Procedure Review  Request Date: 04/21/16 Requesting Physician: Dr. Allen Norris  PATIENT REVIEW QUESTIONS: The patient responded to the following health history questions as indicated:    1. Are you having any GI issues? no 2. Do you have a personal history of Polyps? no 3. Do you have a family history of Colon Cancer or Polyps? no 4. Diabetes Mellitus? no 5. Joint replacements in the past 12 months?no 6. Major health problems in the past 3 months?no 7. Any artificial heart valves, MVP, or defibrillator?no    MEDICATIONS & ALLERGIES:    Patient reports the following regarding taking any anticoagulation/antiplatelet therapy:   Plavix, Coumadin, Eliquis, Xarelto, Lovenox, Pradaxa, Brilinta, or Effient? no Aspirin? no  Patient confirms/reports the following medications:  Current Outpatient Prescriptions  Medication Sig Dispense Refill  . fluticasone (FLONASE) 50 MCG/ACT nasal spray Place 2 sprays into both nostrils daily. 48 g 3  . medroxyPROGESTERone (DEPO-PROVERA) 150 MG/ML injection Inject 1 mL (150 mg total) into the muscle every 3 (three) months. 1 mL 1   No current facility-administered medications for this visit.    Patient confirms/reports the following allergies:  Allergies  Allergen Reactions  . Ciprofloxacin     No orders of the defined types were placed in this encounter.    AUTHORIZATION INFORMATION Primary Insurance: 1D#: Group #:  Secondary Insurance: 1D#: Group #:  SCHEDULE INFORMATION: Date: 04/21/16 Time: Location: [place]

## 2016-04-13 ENCOUNTER — Encounter: Payer: Self-pay | Admitting: *Deleted

## 2016-04-20 NOTE — Discharge Instructions (Signed)

## 2016-04-21 ENCOUNTER — Encounter
Admission: RE | Disposition: A | Discharge status: Home / Self Care | Payer: Self-pay | Source: Ambulatory Visit | Attending: Gastroenterology

## 2016-04-21 ENCOUNTER — Ambulatory Visit: Payer: 59 | Admitting: Student in an Organized Health Care Education/Training Program

## 2016-04-21 ENCOUNTER — Ambulatory Visit
Admission: RE | Admit: 2016-04-21 | Discharge: 2016-04-21 | Disposition: A | Discharge status: Home / Self Care | Payer: 59 | Source: Ambulatory Visit | Attending: Gastroenterology | Admitting: Gastroenterology

## 2016-04-21 DIAGNOSIS — K641 Second degree hemorrhoids: Secondary | ICD-10-CM | POA: Insufficient documentation

## 2016-04-21 DIAGNOSIS — I10 Essential (primary) hypertension: Secondary | ICD-10-CM | POA: Diagnosis not present

## 2016-04-21 DIAGNOSIS — Z9851 Tubal ligation status: Secondary | ICD-10-CM | POA: Insufficient documentation

## 2016-04-21 DIAGNOSIS — F17210 Nicotine dependence, cigarettes, uncomplicated: Secondary | ICD-10-CM | POA: Insufficient documentation

## 2016-04-21 DIAGNOSIS — Z9109 Other allergy status, other than to drugs and biological substances: Secondary | ICD-10-CM | POA: Diagnosis not present

## 2016-04-21 DIAGNOSIS — Z881 Allergy status to other antibiotic agents status: Secondary | ICD-10-CM | POA: Diagnosis not present

## 2016-04-21 DIAGNOSIS — Z793 Long term (current) use of hormonal contraceptives: Secondary | ICD-10-CM | POA: Insufficient documentation

## 2016-04-21 DIAGNOSIS — Z7951 Long term (current) use of inhaled steroids: Secondary | ICD-10-CM | POA: Diagnosis not present

## 2016-04-21 DIAGNOSIS — Z1211 Encounter for screening for malignant neoplasm of colon: Secondary | ICD-10-CM | POA: Diagnosis not present

## 2016-04-21 DIAGNOSIS — Z803 Family history of malignant neoplasm of breast: Secondary | ICD-10-CM | POA: Diagnosis not present

## 2016-04-21 HISTORY — PX: COLONOSCOPY WITH PROPOFOL: SHX5780

## 2016-04-21 SURGERY — COLONOSCOPY WITH PROPOFOL
Anesthesia: Monitor Anesthesia Care | Wound class: Contaminated

## 2016-04-21 MED ORDER — PROPOFOL 10 MG/ML IV BOLUS
INTRAVENOUS | Status: DC | PRN
  Administered 2016-04-21 (×7): 20 mg via INTRAVENOUS

## 2016-04-21 MED ORDER — SIMETHICONE 40 MG/0.6ML PO SUSP
ORAL | Status: DC | PRN
  Administered 2016-04-21: 09:00:00

## 2016-04-21 MED ORDER — LACTATED RINGERS IV SOLN: INTRAVENOUS | Status: DC

## 2016-04-21 SURGICAL SUPPLY — 22 items
CANISTER SUCT 1200ML W/VALVE (MISCELLANEOUS) ×2 IMPLANT
CLIP HMST 235XBRD CATH ROT (MISCELLANEOUS) IMPLANT
CLIP RESOLUTION 360 11X235 (MISCELLANEOUS)
FCP ESCP3.2XJMB 240X2.8X (MISCELLANEOUS)
FORCEPS BIOP RAD 4 LRG CAP 4 (CUTTING FORCEPS) IMPLANT
FORCEPS BIOP RJ4 240 W/NDL (MISCELLANEOUS)
FORCEPS ESCP3.2XJMB 240X2.8X (MISCELLANEOUS) IMPLANT
GOWN CVR UNV OPN BCK APRN NK (MISCELLANEOUS) ×2 IMPLANT
GOWN ISOL THUMB LOOP REG UNIV (MISCELLANEOUS) ×2
INJECTOR VARIJECT VIN23 (MISCELLANEOUS) IMPLANT
KIT DEFENDO VALVE AND CONN (KITS) IMPLANT
KIT ENDO PROCEDURE OLY (KITS) ×2 IMPLANT
MARKER SPOT ENDO TATTOO 5ML (MISCELLANEOUS) IMPLANT
PAD GROUND ADULT SPLIT (MISCELLANEOUS) IMPLANT
PROBE APC STR FIRE (PROBE) IMPLANT
SNARE SHORT THROW 13M SML OVAL (MISCELLANEOUS) IMPLANT
SNARE SHORT THROW 30M LRG OVAL (MISCELLANEOUS) IMPLANT
SNARE SNG USE RND 15MM (INSTRUMENTS) IMPLANT
SPOT EX ENDOSCOPIC TATTOO (MISCELLANEOUS)
TRAP ETRAP POLY (MISCELLANEOUS) IMPLANT
VARIJECT INJECTOR VIN23 (MISCELLANEOUS)
WATER STERILE IRR 250ML POUR (IV SOLUTION) ×2 IMPLANT

## 2016-04-21 NOTE — H&P (Signed)
  [facility]  [address], [place], [place] [address] Phone: [phone] Fax : [phone]  Primary Care Physician:  Enid Derry, MD Primary Gastroenterologist:  Dr. Allen Norris  Pre-Procedure History & Physical: HPI:  Joy Ray is a 50 y.o. female is here for a screening colonoscopy.   Past Medical History  Diagnosis Date  . HTN, goal below 140/90   . Allergy   . Cigarette smoker     Past Surgical History  Procedure Laterality Date  . Tubal ligation  1994    After G1P1001    Prior to Admission medications   Medication Sig Start Date End Date Taking? Authorizing Provider  fluticasone (FLONASE) 50 MCG/ACT nasal spray Place 2 sprays into both nostrils daily. 04/05/16  Yes Arnetha Courser, MD  medroxyPROGESTERone (DEPO-PROVERA) 150 MG/ML injection Inject 1 mL (150 mg total) into the muscle every 3 (three) months. 03/23/16   Arnetha Courser, MD    Allergies as of 04/11/2016 - Review Complete 04/05/2016  Allergen Reaction Noted  . Ciprofloxacin  10/13/2015    Family History  Problem Relation Age of Onset  . Cancer Mother     breast  . Breast cancer Mother 41  . Breast cancer Maternal Aunt     Social History   Social History  . Marital Status: Single    Spouse Name: N/A  . Number of Children: N/A  . Years of Education: N/A   Occupational History  . Not on file.   Social History Main Topics  . Smoking status: Current Every Day Smoker -- 0.50 packs/day for 20 years    Types: Cigarettes  . Smokeless tobacco: Not on file     Comment: pt has cut back to 1 or 2 cigs/day  . Alcohol Use: 0.6 oz/week    0 Standard drinks or equivalent, 1 Glasses of wine per week     Comment:    . Drug Use: No  . Sexual Activity: No   Other Topics Concern  . Not on file   Social History Narrative    Review of Systems: See HPI, otherwise negative ROS  Physical Exam: BP 138/98 mmHg  Pulse 80  Temp(Src) 98.1 F (36.7 C) (Temporal)  Ht 5\' 8"  (1.727  m)  Wt 132 lb (59.875 kg)  BMI 20.08 kg/m2  SpO2 100%  LMP  General:   Alert,  pleasant and cooperative in NAD Head:  Normocephalic and atraumatic. Neck:  Supple; no masses or thyromegaly. Lungs:  Clear throughout to auscultation.    Heart:  Regular rate and rhythm. Abdomen:  Soft, nontender and nondistended. Normal bowel sounds, without guarding, and without rebound.   Neurologic:  Alert and  oriented x4;  grossly normal neurologically.  Impression/Plan: Joy Ray is now here to undergo a screening colonoscopy.  Risks, benefits, and alternatives regarding colonoscopy have been reviewed with the patient.  Questions have been answered.  All parties agreeable.

## 2016-04-21 NOTE — Anesthesia Preprocedure Evaluation (Signed)
Anesthesia Evaluation  Patient identified by MRN, date of birth, ID band Patient awake    Reviewed: Allergy & Precautions, NPO status , Patient's Chart, lab work & pertinent test results  Airway Mallampati: I  TM Distance: >3 FB Neck ROM: Full    Dental no notable dental hx.    Pulmonary Current Smoker,    Pulmonary exam normal        Cardiovascular hypertension, Normal cardiovascular exam     Neuro/Psych negative neurological ROS     GI/Hepatic negative GI ROS, Neg liver ROS,   Endo/Other  negative endocrine ROS  Renal/GU negative Renal ROS  negative genitourinary   Musculoskeletal negative musculoskeletal ROS (+)   Abdominal   Peds  Hematology negative hematology ROS (+)   Anesthesia Other Findings   Reproductive/Obstetrics negative OB ROS                             Anesthesia Physical Anesthesia Plan  ASA: II  Anesthesia Plan: MAC   Post-op Pain Management:    Induction: Intravenous  Airway Management Planned:   Additional Equipment:   Intra-op Plan:   Post-operative Plan:   Informed Consent: I have reviewed the patients History and Physical, chart, labs and discussed the procedure including the risks, benefits and alternatives for the proposed anesthesia with the patient or authorized representative who has indicated his/her understanding and acceptance.     Plan Discussed with: CRNA  Anesthesia Plan Comments:         Anesthesia Quick Evaluation

## 2016-04-21 NOTE — Transfer of Care (Signed)
Immediate Anesthesia Transfer of Care Note  Patient: Joy Ray  Procedure(s) Performed: Procedure(s): COLONOSCOPY WITH PROPOFOL (N/A)  Patient Location: PACU  Anesthesia Type: MAC  Level of Consciousness: awake, alert  and patient cooperative  Airway and Oxygen Therapy: Patient Spontanous Breathing and Patient connected to supplemental oxygen  Post-op Assessment: Post-op Vital signs reviewed, Patient's Cardiovascular Status Stable, Respiratory Function Stable, Patent Airway and No signs of Nausea or vomiting  Post-op Vital Signs: Reviewed and stable  Complications: No apparent anesthesia complications

## 2016-04-21 NOTE — Anesthesia Postprocedure Evaluation (Signed)
Anesthesia Post Note  Patient: Joy Ray  Procedure(s) Performed: Procedure(s) (LRB): COLONOSCOPY WITH PROPOFOL (N/A)  Patient location during evaluation: PACU Anesthesia Type: MAC Level of consciousness: awake and alert and oriented Pain management: pain level controlled Vital Signs Assessment: post-procedure vital signs reviewed and stable Respiratory status: spontaneous breathing and nonlabored ventilation Cardiovascular status: stable Postop Assessment: no signs of nausea or vomiting and adequate PO intake Anesthetic complications: no    Estill Batten

## 2016-04-21 NOTE — Anesthesia Procedure Notes (Signed)
Procedure Name: MAC Performed by: Creedence Kunesh Pre-anesthesia Checklist: Patient identified, Emergency Drugs available, Suction available, Timeout performed and Patient being monitored Patient Re-evaluated:Patient Re-evaluated prior to inductionOxygen Delivery Method: Nasal cannula Placement Confirmation: positive ETCO2     

## 2016-04-21 NOTE — Op Note (Signed)
[facility] Patient Name: Joy Ray Procedure Date: 04/21/2016 8:40 AM MRN: [id number] Account #: [id number] Date of Birth: 1966/08/24 Admit Type: Outpatient Age: 50 Room: [facility] OR ROOM 01 Gender: Female Note Status: Finalized Procedure:            Colonoscopy Indications:          Screening for colorectal malignant neoplasm Providers:            Lucilla Lame, MD Referring MD:         Arnetha Courser (Referring MD) Medicines:            Propofol per Anesthesia Complications:        No immediate complications. Procedure:            Pre-Anesthesia Assessment:                       - Prior to the procedure, a History and Physical was                        performed, and patient medications and allergies were                        reviewed. The patient's tolerance of previous                        anesthesia was also reviewed. The risks and benefits of                        the procedure and the sedation options and risks were                        discussed with the patient. All questions were                        answered, and informed consent was obtained. Prior                        Anticoagulants: The patient has taken no previous                        anticoagulant or antiplatelet agents. ASA Grade                        Assessment: II - A patient with mild systemic disease.                        After reviewing the risks and benefits, the patient was                        deemed in satisfactory condition to undergo the                        procedure.                       After obtaining informed consent, the colonoscope was                        passed under direct vision. Throughout the procedure,  the patient's blood pressure, pulse, and oxygen                        saturations were monitored continuously. The Olympus                        CF-HQ190L Colonoscope (S#. [id number]) was introduced             through the anus and advanced to the the cecum,                        identified by appendiceal orifice and ileocecal valve.                        The colonoscopy was performed without difficulty. The                        patient tolerated the procedure well. The quality of                        the bowel preparation was good. Findings:      The perianal and digital rectal examinations were normal.      Non-bleeding internal hemorrhoids were found during retroflexion. The       hemorrhoids were Grade II (internal hemorrhoids that prolapse but reduce       spontaneously). Impression:           - Non-bleeding internal hemorrhoids.                       - No specimens collected. Recommendation:       - Repeat colonoscopy in 10 years for screening unless                        any change in family history or lower GI problems. Procedure Code(s):    --- Professional ---                       [phone], Colonoscopy, flexible; diagnostic, including                        collection of specimen(s) by brushing or washing, when                        performed (separate procedure) Diagnosis Code(s):    --- Professional ---                       Z12.11, Encounter for screening for malignant neoplasm                        of colon                       K64.1, Second degree hemorrhoids CPT copyright 2016 American Medical Association. All rights reserved. The codes documented in this report are preliminary and upon coder review may  be revised to meet current compliance requirements. Lucilla Lame, MD 04/21/2016 9:05:47 AM This report has been signed electronically. Number of Addenda: 0 Note Initiated On: 04/21/2016 8:40 AM Scope Withdrawal Time: 0 hours 6 minutes 16 seconds  Total Procedure Duration: 0 hours 12 minutes 40 seconds  [facility]

## 2016-04-22 ENCOUNTER — Encounter: Payer: Self-pay | Admitting: Gastroenterology

## 2016-04-30 ENCOUNTER — Telehealth: Payer: Self-pay | Admitting: Family Medicine

## 2016-04-30 DIAGNOSIS — Z0000 Encounter for general adult medical examination without abnormal findings: Secondary | ICD-10-CM | POA: Insufficient documentation

## 2016-04-30 NOTE — Assessment & Plan Note (Signed)
Previously followed at [facility]; 2014 pap smear normal; thin prep collected today with HPV co-testing

## 2016-04-30 NOTE — Assessment & Plan Note (Signed)
Thin prep collected today

## 2016-04-30 NOTE — Assessment & Plan Note (Signed)
USPSTF grade A and B recommendations reviewed with patient; age-appropriate recommendations, preventive care, screening tests, etc discussed and encouraged; healthy living encouraged; see AVS for patient education given to patient  

## 2016-04-30 NOTE — Telephone Encounter (Signed)
Please call patient Her last pap smear was normal, and I was glad to see that I looked through her chart and found that this is the 2nd normal pap smear, but I would like to see three normal in a row before we spread them out to 3-5 years; I just can't find one before 2014 Please let her know we'll want to do another pap smear in April 2018 Thank you

## 2016-04-30 NOTE — Assessment & Plan Note (Signed)
Controlled today; previous provider noted that she may have component of white coat HTN

## 2016-05-02 NOTE — Telephone Encounter (Signed)
Left voice mail

## 2016-06-28 ENCOUNTER — Ambulatory Visit (INDEPENDENT_AMBULATORY_CARE_PROVIDER_SITE_OTHER): Payer: 59

## 2016-06-28 ENCOUNTER — Other Ambulatory Visit: Payer: Self-pay

## 2016-06-28 DIAGNOSIS — Z3042 Encounter for surveillance of injectable contraceptive: Secondary | ICD-10-CM | POA: Diagnosis not present

## 2016-06-28 MED ORDER — MEDROXYPROGESTERONE ACETATE 150 MG/ML IM SUSP
150.0000 mg | Freq: Once | INTRAMUSCULAR | Status: AC
  Administered 2016-06-28: 150 mg via INTRAMUSCULAR

## 2016-06-28 MED ORDER — MEDROXYPROGESTERONE ACETATE 150 MG/ML IM SUSP: 150.0000 mg | INTRAMUSCULAR | Status: DC

## 2016-06-28 NOTE — Telephone Encounter (Signed)
Bone density ordered in April, will allow one dose, with note on Rx that dexa needed for next refill

## 2016-09-23 ENCOUNTER — Other Ambulatory Visit: Payer: Self-pay | Admitting: Family Medicine

## 2016-09-24 MED ORDER — MEDROXYPROGESTERONE ACETATE 150 MG/ML IM SUSP: 150.0000 mg | INTRAMUSCULAR | 0 refills | Status: DC

## 2016-09-24 NOTE — Telephone Encounter (Signed)
Please let patient know that we would like her to get her bone density test done please; this is very important; prolonged use of Depo can cause osteoporosis which can lead to hip fractures, vertebral fractures, etc I'll approve one more, but we'll need to have those results back for any further refills; thank you We're just trying to look out for her and do what's best

## 2016-09-26 NOTE — Telephone Encounter (Signed)
Patient notified and bone density scheduled for Oct 23 @ 9:00

## 2016-09-28 ENCOUNTER — Ambulatory Visit (INDEPENDENT_AMBULATORY_CARE_PROVIDER_SITE_OTHER): Payer: 59

## 2016-09-28 DIAGNOSIS — Z3042 Encounter for surveillance of injectable contraceptive: Secondary | ICD-10-CM | POA: Diagnosis not present

## 2016-09-28 MED ORDER — MEDROXYPROGESTERONE ACETATE 150 MG/ML IM SUSP
150.0000 mg | Freq: Once | INTRAMUSCULAR | Status: AC
  Administered 2016-09-28: 150 mg via INTRAMUSCULAR

## 2016-10-10 ENCOUNTER — Ambulatory Visit
Admission: RE | Admit: 2016-10-10 | Discharge: 2016-10-10 | Disposition: A | Discharge status: Home / Self Care | Payer: 59 | Source: Ambulatory Visit | Attending: Family Medicine | Admitting: Family Medicine

## 2016-10-10 DIAGNOSIS — Z13820 Encounter for screening for osteoporosis: Secondary | ICD-10-CM | POA: Diagnosis not present

## 2016-10-10 DIAGNOSIS — F17200 Nicotine dependence, unspecified, uncomplicated: Secondary | ICD-10-CM | POA: Diagnosis present

## 2016-10-10 DIAGNOSIS — Z793 Long term (current) use of hormonal contraceptives: Secondary | ICD-10-CM | POA: Diagnosis not present

## 2016-11-28 ENCOUNTER — Telehealth: Payer: Self-pay | Admitting: Family Medicine

## 2016-11-28 ENCOUNTER — Other Ambulatory Visit: Payer: Self-pay

## 2016-11-28 MED ORDER — MEDROXYPROGESTERONE ACETATE 150 MG/ML IM SUSP: 150.0000 mg | INTRAMUSCULAR | 0 refills | Status: DC

## 2016-11-28 NOTE — Telephone Encounter (Signed)
DEXA UTD Last injection 10/29/16, written on Rx

## 2016-11-28 NOTE — Telephone Encounter (Signed)
Patient has depo appointment for 12/30/15. She is asking that you please send refill for her depo to [facility]

## 2016-12-29 ENCOUNTER — Ambulatory Visit (INDEPENDENT_AMBULATORY_CARE_PROVIDER_SITE_OTHER): Payer: 59

## 2016-12-29 DIAGNOSIS — Z3042 Encounter for surveillance of injectable contraceptive: Secondary | ICD-10-CM | POA: Diagnosis not present

## 2016-12-29 MED ORDER — MEDROXYPROGESTERONE ACETATE 150 MG/ML IM SUSY
150.0000 mg | PREFILLED_SYRINGE | INTRAMUSCULAR | Status: AC
  Administered 2016-12-29: 150 mg via INTRAMUSCULAR

## 2016-12-29 NOTE — Progress Notes (Signed)
Patient came in for her Depo shot and states she is out of refills. Please sent in a new prescription, also patient will be coming back on March 23, 2017 for her CPE.

## 2017-03-23 ENCOUNTER — Telehealth: Payer: Self-pay

## 2017-03-23 ENCOUNTER — Ambulatory Visit (INDEPENDENT_AMBULATORY_CARE_PROVIDER_SITE_OTHER): Payer: 59

## 2017-03-23 DIAGNOSIS — Z3042 Encounter for surveillance of injectable contraceptive: Secondary | ICD-10-CM | POA: Diagnosis not present

## 2017-03-23 MED ORDER — MEDROXYPROGESTERONE ACETATE 150 MG/ML IM SUSY
150.0000 mg | PREFILLED_SYRINGE | INTRAMUSCULAR | Status: AC
  Administered 2017-03-23: 150 mg via INTRAMUSCULAR

## 2017-03-23 MED ORDER — MEDROXYPROGESTERONE ACETATE 150 MG/ML IM SUSP: 150.0000 mg | Freq: Once | INTRAMUSCULAR | Status: DC

## 2017-03-23 NOTE — Telephone Encounter (Signed)
Pt asked for a refill  For her depo shot. On the box DR. lada asked for pt to get a bone density scan done before she gets  A refill. Called pt and left a detail voicemail.

## 2017-03-27 ENCOUNTER — Other Ambulatory Visit: Payer: Self-pay

## 2017-03-27 DIAGNOSIS — Z13820 Encounter for screening for osteoporosis: Secondary | ICD-10-CM

## 2017-03-28 ENCOUNTER — Telehealth: Payer: Self-pay | Admitting: Family Medicine

## 2017-03-28 NOTE — Telephone Encounter (Signed)
Patient does not need a DEXA scan again She just had one less than 6 months ago Please contact patient if she thinks she is supposed to get one

## 2017-03-28 NOTE — Telephone Encounter (Signed)
We'll actually see her soon for her physical We'll want to see her yearly and she has an appt 04/10/17; we'll see her then Thank you

## 2017-03-28 NOTE — Telephone Encounter (Signed)
Pt states that on her depo box it states she need a bone density before getting another refill. I will call her and tell her she do not need one. Can you please refill her depo please since she don't need one.

## 2017-04-10 ENCOUNTER — Encounter: Payer: Self-pay | Admitting: Family Medicine

## 2017-04-10 ENCOUNTER — Ambulatory Visit (INDEPENDENT_AMBULATORY_CARE_PROVIDER_SITE_OTHER): Payer: 59 | Admitting: Family Medicine

## 2017-04-10 VITALS — BP 124/88 | HR 92 | Temp 98.8°F | Resp 14 | Ht 67.8 in | Wt 128.6 lb

## 2017-04-10 DIAGNOSIS — Z0000 Encounter for general adult medical examination without abnormal findings: Secondary | ICD-10-CM

## 2017-04-10 DIAGNOSIS — Z1211 Encounter for screening for malignant neoplasm of colon: Secondary | ICD-10-CM

## 2017-04-10 DIAGNOSIS — Z1231 Encounter for screening mammogram for malignant neoplasm of breast: Secondary | ICD-10-CM

## 2017-04-10 DIAGNOSIS — Z3042 Encounter for surveillance of injectable contraceptive: Secondary | ICD-10-CM

## 2017-04-10 DIAGNOSIS — F17210 Nicotine dependence, cigarettes, uncomplicated: Secondary | ICD-10-CM

## 2017-04-10 DIAGNOSIS — Z1239 Encounter for other screening for malignant neoplasm of breast: Secondary | ICD-10-CM

## 2017-04-10 DIAGNOSIS — Z124 Encounter for screening for malignant neoplasm of cervix: Secondary | ICD-10-CM | POA: Diagnosis not present

## 2017-04-10 MED ORDER — MEDROXYPROGESTERONE ACETATE 150 MG/ML IM SUSP: INTRAMUSCULAR | 3 refills | Status: DC

## 2017-04-10 NOTE — Assessment & Plan Note (Signed)
UTD

## 2017-04-10 NOTE — Assessment & Plan Note (Signed)
Every 13 weeks; patient aware of risk of osteoporosis

## 2017-04-10 NOTE — Assessment & Plan Note (Signed)
Encouraged cessation.

## 2017-04-10 NOTE — Assessment & Plan Note (Signed)
Pap smear collected today 

## 2017-04-10 NOTE — Patient Instructions (Addendum)
I'll suggest 1,000 iu of vitamin D3 once a day Health Maintenance, Female Adopting a healthy lifestyle and getting preventive care can go a long way to promote health and wellness. Talk with your health care provider about what schedule of regular examinations is right for you. This is a good chance for you to check in with your provider about disease prevention and staying healthy. In between checkups, there are plenty of things you can do on your own. Experts have done a lot of research about which lifestyle changes and preventive measures are most likely to keep you healthy. Ask your health care provider for more information. Weight and diet Eat a healthy diet  Be sure to include plenty of vegetables, fruits, low-fat dairy products, and lean protein.  Do not eat a lot of foods high in solid fats, added sugars, or salt.  Get regular exercise. This is one of the most important things you can do for your health.  Most adults should exercise for at least 150 minutes each week. The exercise should increase your heart rate and make you sweat (moderate-intensity exercise).  Most adults should also do strengthening exercises at least twice a week. This is in addition to the moderate-intensity exercise. Maintain a healthy weight  Body mass index (BMI) is a measurement that can be used to identify possible weight problems. It estimates body fat based on height and weight. Your health care provider can help determine your BMI and help you achieve or maintain a healthy weight.  For females 39 years of age and older:  A BMI below 18.5 is considered underweight.  A BMI of 18.5 to 24.9 is normal.  A BMI of 25 to 29.9 is considered overweight.  A BMI of 30 and above is considered obese. Watch levels of cholesterol and blood lipids  You should start having your blood tested for lipids and cholesterol at 49 years of age, then have this test every 5 years.  You may need to have your cholesterol  levels checked more often if:  Your lipid or cholesterol levels are high.  You are older than 49 years of age.  You are at high risk for heart disease. Cancer screening Lung Cancer  Lung cancer screening is recommended for adults 87-22 years old who are at high risk for lung cancer because of a history of smoking.  A yearly low-dose CT scan of the lungs is recommended for people who:  Currently smoke.  Have quit within the past 15 years.  Have at least a 30-pack-year history of smoking. A pack year is smoking an average of one pack of cigarettes a day for 1 year.  Yearly screening should continue until it has been 15 years since you quit.  Yearly screening should stop if you develop a health problem that would prevent you from having lung cancer treatment. Breast Cancer  Practice breast self-awareness. This means understanding how your breasts normally appear and feel.  It also means doing regular breast self-exams. Let your health care provider know about any changes, no matter how small.  If you are in your 20s or 30s, you should have a clinical breast exam (CBE) by a health care provider every 1-3 years as part of a regular health exam.  If you are 69 or older, have a CBE every year. Also consider having a breast X-ray (mammogram) every year.  If you have a family history of breast cancer, talk to your health care provider about genetic screening.  If you are at high risk for breast cancer, talk to your health care provider about having an MRI and a mammogram every year.  Breast cancer gene (BRCA) assessment is recommended for women who have family members with BRCA-related cancers. BRCA-related cancers include:  Breast.  Ovarian.  Tubal.  Peritoneal cancers.  Results of the assessment will determine the need for genetic counseling and BRCA1 and BRCA2 testing. Cervical Cancer  Your health care provider may recommend that you be screened regularly for cancer of the  pelvic organs (ovaries, uterus, and vagina). This screening involves a pelvic examination, including checking for microscopic changes to the surface of your cervix (Pap test). You may be encouraged to have this screening done every 3 years, beginning at age 21.  For women ages 30-65, health care providers may recommend pelvic exams and Pap testing every 3 years, or they may recommend the Pap and pelvic exam, combined with testing for human papilloma virus (HPV), every 5 years. Some types of HPV increase your risk of cervical cancer. Testing for HPV may also be done on women of any age with unclear Pap test results.  Other health care providers may not recommend any screening for nonpregnant women who are considered low risk for pelvic cancer and who do not have symptoms. Ask your health care provider if a screening pelvic exam is right for you.  If you have had past treatment for cervical cancer or a condition that could lead to cancer, you need Pap tests and screening for cancer for at least 20 years after your treatment. If Pap tests have been discontinued, your risk factors (such as having a new sexual partner) need to be reassessed to determine if screening should resume. Some women have medical problems that increase the chance of getting cervical cancer. In these cases, your health care provider may recommend more frequent screening and Pap tests. Colorectal Cancer  This type of cancer can be detected and often prevented.  Routine colorectal cancer screening usually begins at 50 years of age and continues through 49 years of age.  Your health care provider may recommend screening at an earlier age if you have risk factors for colon cancer.  Your health care provider may also recommend using home test kits to check for hidden blood in the stool.  A small camera at the end of a tube can be used to examine your colon directly (sigmoidoscopy or colonoscopy). This is done to check for the earliest  forms of colorectal cancer.  Routine screening usually begins at age 50.  Direct examination of the colon should be repeated every 5-10 years through 49 years of age. However, you may need to be screened more often if early forms of precancerous polyps or small growths are found. Skin Cancer  Check your skin from head to toe regularly.  Tell your health care provider about any new moles or changes in moles, especially if there is a change in a mole's shape or color.  Also tell your health care provider if you have a mole that is larger than the size of a pencil eraser.  Always use sunscreen. Apply sunscreen liberally and repeatedly throughout the day.  Protect yourself by wearing long sleeves, pants, a wide-brimmed hat, and sunglasses whenever you are outside. Heart disease, diabetes, and high blood pressure  High blood pressure causes heart disease and increases the risk of stroke. High blood pressure is more likely to develop in:  People who have blood pressure in the   high end of the normal range (130-139/85-89 mm Hg).  People who are overweight or obese.  People who are African American.  If you are 18-39 years of age, have your blood pressure checked every 3-5 years. If you are 40 years of age or older, have your blood pressure checked every year. You should have your blood pressure measured twice-once when you are at a hospital or clinic, and once when you are not at a hospital or clinic. Record the average of the two measurements. To check your blood pressure when you are not at a hospital or clinic, you can use:  An automated blood pressure machine at a pharmacy.  A home blood pressure monitor.  If you are between 55 years and 79 years old, ask your health care provider if you should take aspirin to prevent strokes.  Have regular diabetes screenings. This involves taking a blood sample to check your fasting blood sugar level.  If you are at a normal weight and have a low  risk for diabetes, have this test once every three years after 49 years of age.  If you are overweight and have a high risk for diabetes, consider being tested at a younger age or more often. Preventing infection Hepatitis B  If you have a higher risk for hepatitis B, you should be screened for this virus. You are considered at high risk for hepatitis B if:  You were born in a country where hepatitis B is common. Ask your health care provider which countries are considered high risk.  Your parents were born in a high-risk country, and you have not been immunized against hepatitis B (hepatitis B vaccine).  You have HIV or AIDS.  You use needles to inject street drugs.  You live with someone who has hepatitis B.  You have had sex with someone who has hepatitis B.  You get hemodialysis treatment.  You take certain medicines for conditions, including cancer, organ transplantation, and autoimmune conditions. Hepatitis C  Blood testing is recommended for:  Everyone born from 1945 through 1965.  Anyone with known risk factors for hepatitis C. Sexually transmitted infections (STIs)  You should be screened for sexually transmitted infections (STIs) including gonorrhea and chlamydia if:  You are sexually active and are younger than 49 years of age.  You are older than 49 years of age and your health care provider tells you that you are at risk for this type of infection.  Your sexual activity has changed since you were last screened and you are at an increased risk for chlamydia or gonorrhea. Ask your health care provider if you are at risk.  If you do not have HIV, but are at risk, it may be recommended that you take a prescription medicine daily to prevent HIV infection. This is called pre-exposure prophylaxis (PrEP). You are considered at risk if:  You are sexually active and do not regularly use condoms or know the HIV status of your partner(s).  You take drugs by  injection.  You are sexually active with a partner who has HIV. Talk with your health care provider about whether you are at high risk of being infected with HIV. If you choose to begin PrEP, you should first be tested for HIV. You should then be tested every 3 months for as long as you are taking PrEP. Pregnancy  If you are premenopausal and you may become pregnant, ask your health care provider about preconception counseling.  If you may become   pregnant, take 400 to 800 micrograms (mcg) of folic acid every day.  If you want to prevent pregnancy, talk to your health care provider about birth control (contraception). Osteoporosis and menopause  Osteoporosis is a disease in which the bones lose minerals and strength with aging. This can result in serious bone fractures. Your risk for osteoporosis can be identified using a bone density scan.  If you are 1 years of age or older, or if you are at risk for osteoporosis and fractures, ask your health care provider if you should be screened.  Ask your health care provider whether you should take a calcium or vitamin D supplement to lower your risk for osteoporosis.  Menopause may have certain physical symptoms and risks.  Hormone replacement therapy may reduce some of these symptoms and risks. Talk to your health care provider about whether hormone replacement therapy is right for you. Follow these instructions at home:  Schedule regular health, dental, and eye exams.  Stay current with your immunizations.  Do not use any tobacco products including cigarettes, chewing tobacco, or electronic cigarettes.  If you are pregnant, do not drink alcohol.  If you are breastfeeding, limit how much and how often you drink alcohol.  Limit alcohol intake to no more than 1 drink per day for nonpregnant women. One drink equals 12 ounces of beer, 5 ounces of wine, or 1 ounces of hard liquor.  Do not use street drugs.  Do not share needles.  Ask  your health care provider for help if you need support or information about quitting drugs.  Tell your health care provider if you often feel depressed.  Tell your health care provider if you have ever been abused or do not feel safe at home. This information is not intended to replace advice given to you by your health care provider. Make sure you discuss any questions you have with your health care provider. Document Released: 06/20/2011 Document Revised: 05/12/2016 Document Reviewed: 09/08/2015 Elsevier Interactive Patient Education  2017 [employer].

## 2017-04-10 NOTE — Progress Notes (Signed)
Patient ID: Joy Ray, female   DOB: [birth year], 51 y.o.   MRN: [id number]   Subjective:   Joy Ray is a 51 y.o. female here for a complete physical exam  Interim issues since last visit:  USPSTF grade A and B recommendations Depression:  Depression screen [facility] 2/9 04/10/2017 04/05/2016 02/01/2016 10/13/2015  Decreased Interest 0 0 0 0  Down, Depressed, Hopeless 0 0 0 0  PHQ - 2 Score 0 0 0 0   Hypertension: no HTN in the family; last cig more than 30 minutes ago; some Mt Dew BP Readings from Last 3 Encounters:  04/10/17 124/88  04/21/16 123/86  04/05/16 130/72   Obesity: Wt Readings from Last 3 Encounters:  04/10/17 128 lb 9.6 oz (58.3 kg)  04/21/16 132 lb (59.9 kg)  04/05/16 134 lb (60.8 kg)   BMI Readings from Last 3 Encounters:  04/10/17 19.67 kg/m  04/21/16 20.07 kg/m  04/05/16 20.37 kg/m    Alcohol: every now and then Tobacco use: every now and then, not 1/4 PPD; not smoking around grandbabies, not at work HIV, hep B, hep C: declined STD testing and prevention (chl/gon/syphilis): declined Intimate partner violence:no abuse Breast cancer: no lumps, order mammo and do yearly Cervical cancer screening: no hx of abnormal paps she says; however, she has HPV high risk positive followed by [facility] in the chart from Dr. Nadine Counts Osteoporosis: normal; depo and smoker Fall prevention/vitamin D: discussed, suggested 1,000 iu daily Lipids:  Lab Results  Component Value Date   CHOL 174 04/05/2016   Lab Results  Component Value Date   HDL 64 04/05/2016   Lab Results  Component Value Date   LDLCALC 85 04/05/2016   Lab Results  Component Value Date   TRIG 127 04/05/2016   No results found for: CHOLHDL No results found for: LDLDIRECT  Glucose:  Glucose  Date Value Ref Range Status  04/05/2016 95 65 - 99 mg/dL Final   Colorectal cancer: UTD; 10 year pass Lung cancer:  Encouraged to quit AAA: n/a Aspirin: n/a Diet: does eat bacon  and pork chops; limit eggs to 3 yolks per week Exercise: works 2 jobs, busy Skin cancer: burn as a child, no worrisome moles   Past Medical History:  Diagnosis Date  . Allergy   . Cigarette smoker   . HTN, goal below 140/90    Past Surgical History:  Procedure Laterality Date  . COLONOSCOPY WITH PROPOFOL N/A 04/21/2016   Procedure: COLONOSCOPY WITH PROPOFOL;  Surgeon: Lucilla Lame, MD;  Location: [facility];  Service: Endoscopy;  Laterality: N/A;  . TUBAL LIGATION  1994   After G1P1001   Family History  Problem Relation Age of Onset  . Cancer Mother     breast  . Breast cancer Mother 58  . Breast cancer Maternal Aunt    Social History  Substance Use Topics  . Smoking status: Current Some Day Smoker    Packs/day: 0.50    Years: 20.00    Types: Cigarettes  . Smokeless tobacco: Never Used     Comment: pt has cut back to 1 or 2 cigs/day  . Alcohol use 0.6 oz/week    1 Glasses of wine per week     Comment:     Review of Systems  Objective:   Vitals:   04/10/17 1403  BP: 124/88  Pulse: 92  Resp: 14  Temp: 98.8 F (37.1 C)  TempSrc: Oral  SpO2: 96%  Weight: 128 lb 9.6 oz (  58.3 kg)  Height: 5' 7.8" (1.722 m)   Body mass index is 19.67 kg/m. Wt Readings from Last 3 Encounters:  04/10/17 128 lb 9.6 oz (58.3 kg)  04/21/16 132 lb (59.9 kg)  04/05/16 134 lb (60.8 kg)   Physical Exam  Constitutional: She appears well-developed and well-nourished.  HENT:  Head: Normocephalic and atraumatic.  Eyes: Conjunctivae and EOM are normal. Right eye exhibits no hordeolum. Left eye exhibits no hordeolum. No scleral icterus.  Neck: Carotid bruit is not present. No thyromegaly present.  Cardiovascular: Normal rate, regular rhythm, S1 normal, S2 normal and normal heart sounds.   No extrasystoles are present.  Pulmonary/Chest: Effort normal and breath sounds normal. No respiratory distress. Right breast exhibits no inverted nipple, no mass, no nipple discharge, no skin  change and no tenderness. Left breast exhibits no inverted nipple, no mass, no nipple discharge, no skin change and no tenderness. Breasts are symmetrical.  Abdominal: Soft. Normal appearance and bowel sounds are normal. She exhibits no distension, no abdominal bruit, no pulsatile midline mass and no mass. There is no hepatosplenomegaly. There is no tenderness. No hernia.  Genitourinary: Uterus normal. Pelvic exam was performed with patient prone. There is no rash or lesion on the right labia. There is no rash or lesion on the left labia. Cervix exhibits no motion tenderness. Right adnexum displays no mass, no tenderness and no fullness. Left adnexum displays no mass, no tenderness and no fullness.  Musculoskeletal: Normal range of motion. She exhibits no edema.  Lymphadenopathy:       Head (right side): No submandibular adenopathy present.       Head (left side): No submandibular adenopathy present.    She has no cervical adenopathy.    She has no axillary adenopathy.  Neurological: She is alert. She displays no tremor. No cranial nerve deficit. She exhibits normal muscle tone. Gait normal.  Skin: Skin is warm and dry. No bruising and no ecchymosis noted. No cyanosis. No pallor.  Psychiatric: Her speech is normal and behavior is normal. Thought content normal. Her mood appears not anxious. She does not exhibit a depressed mood.    Assessment/Plan:   Problem List Items Addressed This Visit      Other   Screening for cervical cancer    Pap smear collected today      Relevant Orders   Pap IG and HPV (high risk) DNA detection   Preventative health care - Primary    USPSTF grade A and B recommendations reviewed with patient; age-appropriate recommendations, preventive care, screening tests, etc discussed and encouraged; healthy living encouraged; see AVS for patient education given to patient       Relevant Orders   CBC with Differential/Platelet   COMPLETE METABOLIC PANEL WITH GFR    Lipid panel   Encounter for surveillance of injectable contraceptive    Every 13 weeks; patient aware of risk of osteoporosis      Colon cancer screening    UTD      Cigarette smoker    Encouraged cessation       Other Visit Diagnoses    Screening for breast cancer       Relevant Orders   MM Digital Screening      Meds ordered this encounter  Medications  . DISCONTD: MedroxyPROGESTERone Acetate 150 MG/ML SUSY    Sig: Take 150 mLs by mouth every 3 (three) months.    Refill:  0  . DISCONTD: medroxyPROGESTERone (DEPO-PROVERA) 150 MG/ML injection  Sig: Inject 150 mg into the muscle every 3 (three) months.  . medroxyPROGESTERone (DEPO-PROVERA) 150 MG/ML injection    Sig: Inject 150 mg intramuscularly every thirteen weeks    Dispense:  1 mL    Refill:  3   Orders Placed This Encounter  Procedures  . MM Digital Screening    Standing Status:   Future    Number of Occurrences:   1    Standing Expiration Date:   06/10/2018    Order Specific Question:   Reason for Exam (SYMPTOM  OR DIAGNOSIS REQUIRED)    Answer:   screen for breast cancer    Order Specific Question:   Is the patient pregnant?    Answer:   No    Order Specific Question:   Preferred imaging location?    Answer:   [facility]  . CBC with Differential/Platelet  . COMPLETE METABOLIC PANEL WITH GFR  . Lipid panel    Follow up plan: Return in about 1 year (around 04/10/2018) for complete physical; every 13 weeks for Depo.  An After Visit Summary was printed and given to the patient.

## 2017-04-10 NOTE — Assessment & Plan Note (Signed)
USPSTF grade A and B recommendations reviewed with patient; age-appropriate recommendations, preventive care, screening tests, etc discussed and encouraged; healthy living encouraged; see AVS for patient education given to patient  

## 2017-04-11 ENCOUNTER — Telehealth: Payer: Self-pay

## 2017-04-11 DIAGNOSIS — Z124 Encounter for screening for malignant neoplasm of cervix: Secondary | ICD-10-CM | POA: Diagnosis not present

## 2017-04-11 LAB — CBC WITH DIFFERENTIAL/PLATELET
BASOS: 1 %
Basophils Absolute: 0 10*3/uL (ref 0.0–0.2)
EOS (ABSOLUTE): 0.1 10*3/uL (ref 0.0–0.4)
Eos: 2 %
Hematocrit: 43.7 % (ref 34.0–46.6)
Hemoglobin: 14.9 g/dL (ref 11.1–15.9)
IMMATURE GRANS (ABS): 0 10*3/uL (ref 0.0–0.1)
Immature Granulocytes: 0 %
LYMPHS ABS: 2.6 10*3/uL (ref 0.7–3.1)
Lymphs: 40 %
MCH: 34 pg — AB (ref 26.6–33.0)
MCHC: 34.1 g/dL (ref 31.5–35.7)
MCV: 100 fL — AB (ref 79–97)
MONOS ABS: 0.5 10*3/uL (ref 0.1–0.9)
Monocytes: 7 %
NEUTROS ABS: 3.2 10*3/uL (ref 1.4–7.0)
Neutrophils: 50 %
PLATELETS: 261 10*3/uL (ref 150–379)
RBC: 4.38 x10E6/uL (ref 3.77–5.28)
RDW: 14.2 % (ref 12.3–15.4)
WBC: 6.4 10*3/uL (ref 3.4–10.8)

## 2017-04-11 LAB — COMPREHENSIVE METABOLIC PANEL
ALT: 9 IU/L (ref 0–32)
AST: 14 IU/L (ref 0–40)
Albumin/Globulin Ratio: 2.2 (ref 1.2–2.2)
Albumin: 4.6 g/dL (ref 3.5–5.5)
Alkaline Phosphatase: 53 IU/L (ref 39–117)
BUN/Creatinine Ratio: 11 (ref 9–23)
BUN: 11 mg/dL (ref 6–24)
Bilirubin Total: 0.3 mg/dL (ref 0.0–1.2)
CALCIUM: 9.3 mg/dL (ref 8.7–10.2)
CO2: 23 mmol/L (ref 18–29)
CREATININE: 1.04 mg/dL — AB (ref 0.57–1.00)
Chloride: 103 mmol/L (ref 96–106)
GFR, EST AFRICAN AMERICAN: 73 mL/min/{1.73_m2} (ref >59)
GFR, EST NON AFRICAN AMERICAN: 63 mL/min/{1.73_m2} (ref >59)
GLOBULIN, TOTAL: 2.1 g/dL (ref 1.5–4.5)
Glucose: 89 mg/dL (ref 65–99)
Potassium: 4.1 mmol/L (ref 3.5–5.2)
SODIUM: 143 mmol/L (ref 134–144)
TOTAL PROTEIN: 6.7 g/dL (ref 6.0–8.5)

## 2017-04-11 LAB — LIPID PANEL
Chol/HDL Ratio: 2.9 ratio (ref 0.0–4.4)
Cholesterol, Total: 180 mg/dL (ref 100–199)
HDL: 63 mg/dL (ref >39)
LDL Calculated: 93 mg/dL (ref 0–99)
Triglycerides: 122 mg/dL (ref 0–149)
VLDL Cholesterol Cal: 24 mg/dL (ref 5–40)

## 2017-04-11 NOTE — Telephone Encounter (Signed)
-----   Message from Arnetha Courser, MD sent at 04/11/2017 11:47 AM EDT ----- Please let Joy Ray know that her red blood cells are on the large and dense side; that can be from vitamin L57 deficiency, folic acid deficiency, or too much alcohol; if she drinks, recommend she cut back her intake by 50%; if any problems, let us know; start folic acid 262 mcg daily and start vitamin B12 500 mcg daily; her kidney function level declined some since last year; we'll urge her to quit smoking, and also avoid NSAIDs; stay well-hydrated; let's recheck BMP in one month (please order, dx renal insufficiency); thank you

## 2017-04-11 NOTE — Telephone Encounter (Signed)
Tried to reach out to the pt and go over the lab results. Pt primary number voicemail is not set up and her secondary number no answer.

## 2017-04-13 LAB — PAP IG AND HPV HIGH-RISK
HPV, HIGH-RISK: NEGATIVE
PAP Smear Comment: 0

## 2017-04-18 ENCOUNTER — Ambulatory Visit
Admission: RE | Admit: 2017-04-18 | Discharge: 2017-04-18 | Disposition: A | Discharge status: Home / Self Care | Payer: 59 | Source: Ambulatory Visit | Attending: Family Medicine | Admitting: Family Medicine

## 2017-04-18 DIAGNOSIS — Z1231 Encounter for screening mammogram for malignant neoplasm of breast: Secondary | ICD-10-CM | POA: Insufficient documentation

## 2017-06-15 ENCOUNTER — Ambulatory Visit: Payer: 59

## 2017-06-16 ENCOUNTER — Ambulatory Visit: Payer: 59

## 2017-06-16 DIAGNOSIS — Z3042 Encounter for surveillance of injectable contraceptive: Secondary | ICD-10-CM

## 2017-06-16 MED ORDER — MEDROXYPROGESTERONE ACETATE 150 MG/ML IM SUSP
150.0000 mg | Freq: Once | INTRAMUSCULAR | Status: AC
  Administered 2017-06-16: 150 mg via INTRAMUSCULAR

## 2017-07-18 NOTE — Addendum Note (Signed)
Addended by: Bud Face N on: 07/18/2017 08:55 AM   Modules accepted: Orders

## 2017-09-15 ENCOUNTER — Ambulatory Visit (INDEPENDENT_AMBULATORY_CARE_PROVIDER_SITE_OTHER): Payer: 59

## 2017-09-15 DIAGNOSIS — Z3042 Encounter for surveillance of injectable contraceptive: Secondary | ICD-10-CM | POA: Diagnosis not present

## 2017-09-15 MED ORDER — MEDROXYPROGESTERONE ACETATE 150 MG/ML IM SUSP
150.0000 mg | Freq: Once | INTRAMUSCULAR | Status: AC
  Administered 2017-09-15: 150 mg via INTRAMUSCULAR

## 2017-12-15 ENCOUNTER — Ambulatory Visit (INDEPENDENT_AMBULATORY_CARE_PROVIDER_SITE_OTHER): Payer: 59

## 2017-12-15 DIAGNOSIS — Z3042 Encounter for surveillance of injectable contraceptive: Secondary | ICD-10-CM

## 2017-12-15 MED ORDER — MEDROXYPROGESTERONE ACETATE 150 MG/ML IM SUSP
150.0000 mg | Freq: Once | INTRAMUSCULAR | Status: AC
  Administered 2017-12-15: 150 mg via INTRAMUSCULAR

## 2017-12-15 NOTE — Patient Instructions (Signed)

## 2018-01-31 ENCOUNTER — Other Ambulatory Visit: Payer: Self-pay | Admitting: Family Medicine

## 2018-01-31 NOTE — Telephone Encounter (Signed)
appt March Rx approved

## 2018-03-14 ENCOUNTER — Ambulatory Visit (INDEPENDENT_AMBULATORY_CARE_PROVIDER_SITE_OTHER): Payer: 59

## 2018-03-14 ENCOUNTER — Other Ambulatory Visit: Payer: Self-pay

## 2018-03-14 DIAGNOSIS — Z3042 Encounter for surveillance of injectable contraceptive: Secondary | ICD-10-CM | POA: Diagnosis not present

## 2018-03-14 MED ORDER — MEDROXYPROGESTERONE ACETATE 150 MG/ML IM SUSY
150.0000 mg | PREFILLED_SYRINGE | INTRAMUSCULAR | Status: AC
  Administered 2018-03-14: 150 mg via INTRAMUSCULAR

## 2018-03-14 NOTE — Telephone Encounter (Signed)
lvm on [phone] @ 12:56 Per dr lada pt need to schedule annual physical before anymore refills. Pt does have option to see Benjamine Mola if Dr Sanda Klein is booked.

## 2018-03-14 NOTE — Telephone Encounter (Signed)
Patient received Depo shot today She was seen April 2018 She'll need an appt just after April 10, 2018 for her physical and future refills of Depo Thank you

## 2018-03-15 ENCOUNTER — Ambulatory Visit: Payer: 59

## 2018-04-10 ENCOUNTER — Encounter: Payer: 59 | Admitting: Nurse Practitioner

## 2018-04-16 IMAGING — MG MM DIGITAL SCREENING BILAT W/ CAD
4 series · 4 of 4 positions shown · non-contrast
Comparison: Previous exam(s).

CLINICAL DATA: Screening.

EXAM:
DIGITAL SCREENING BILATERAL MAMMOGRAM WITH CAD

[L CC]
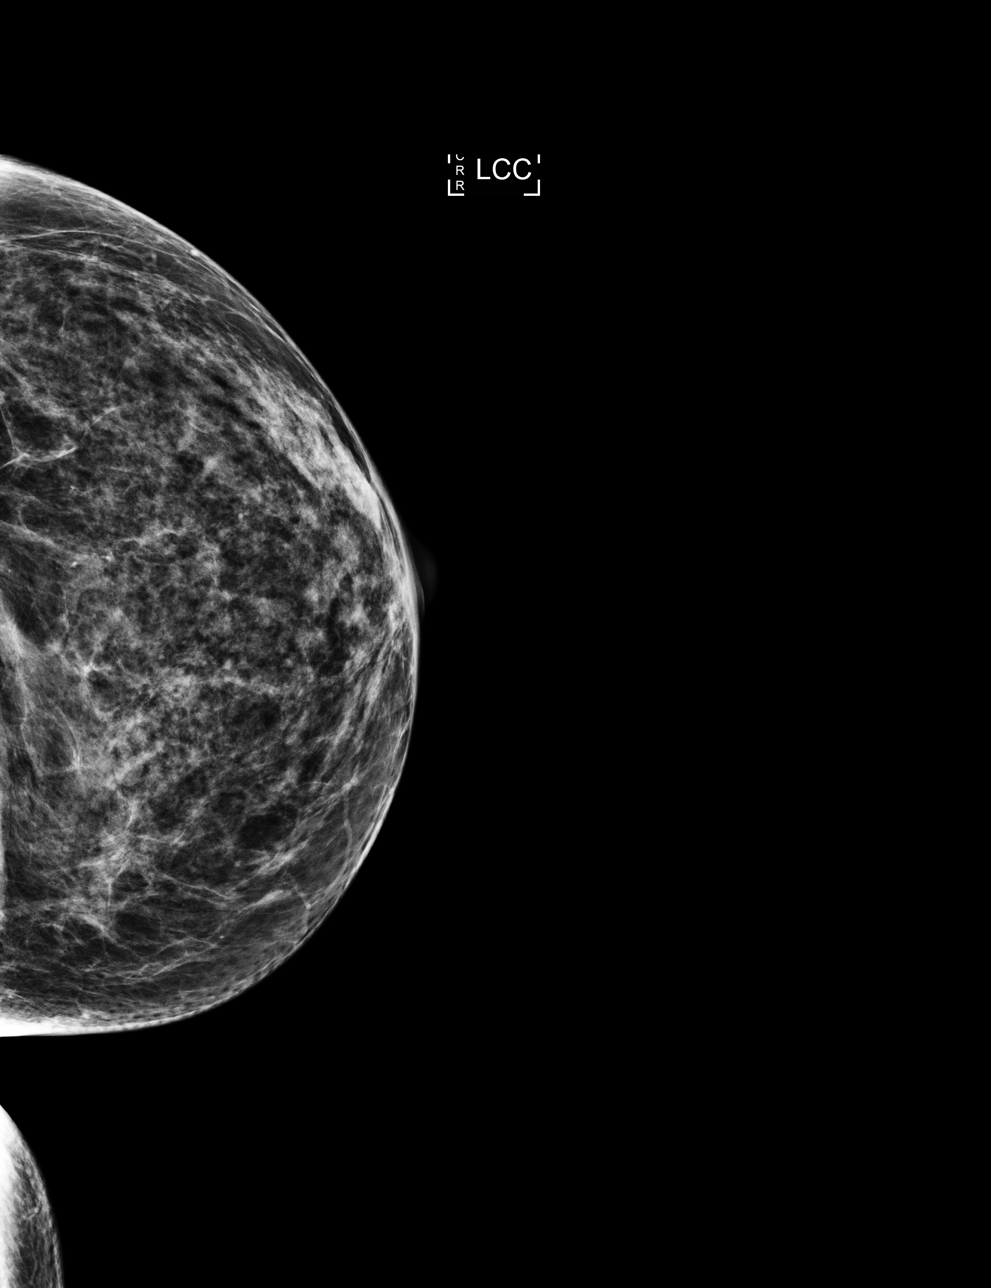

[R MLO]
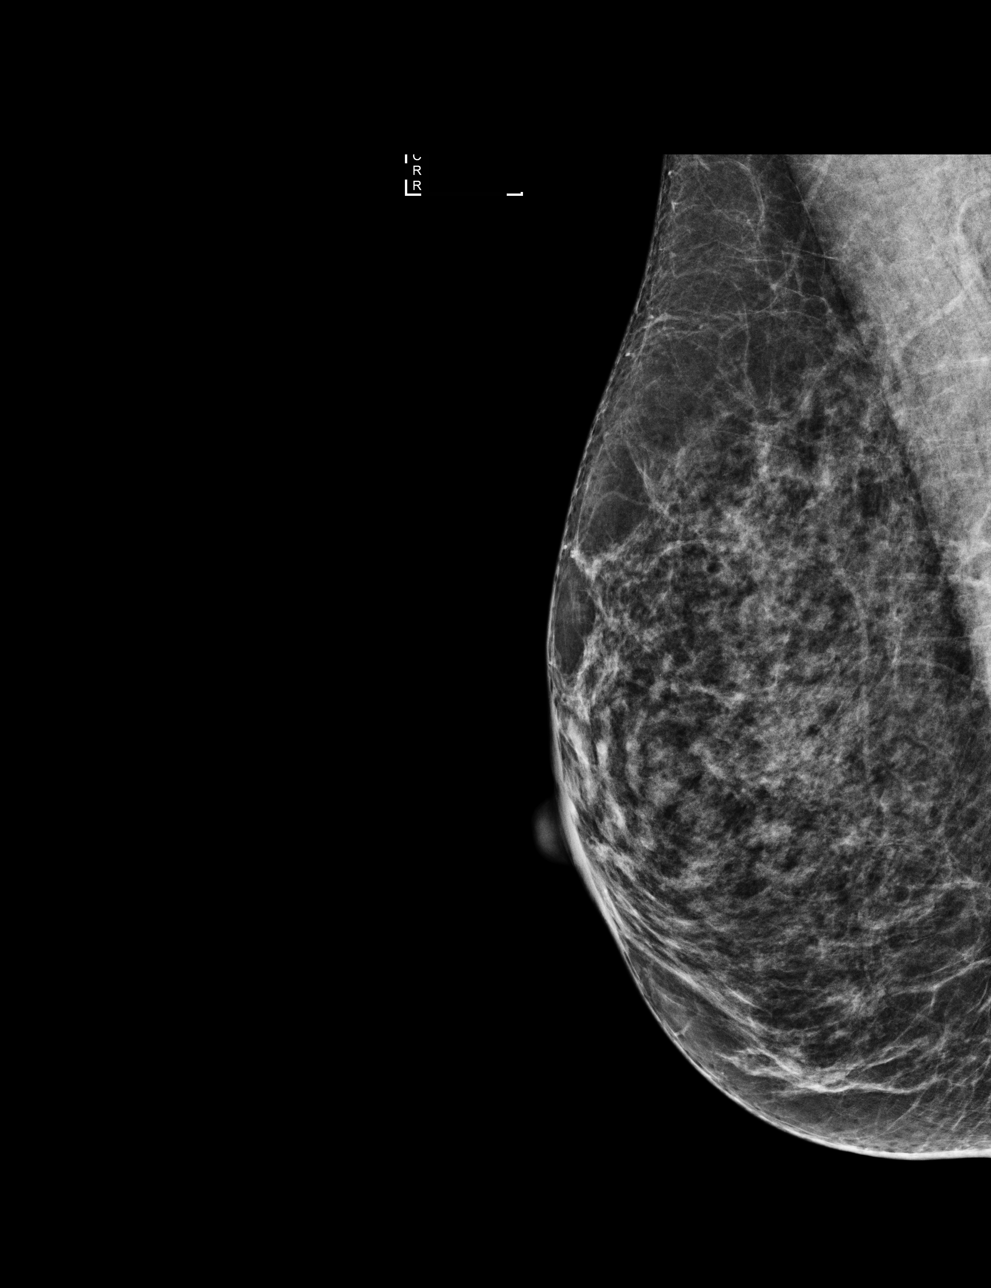

[R CC]
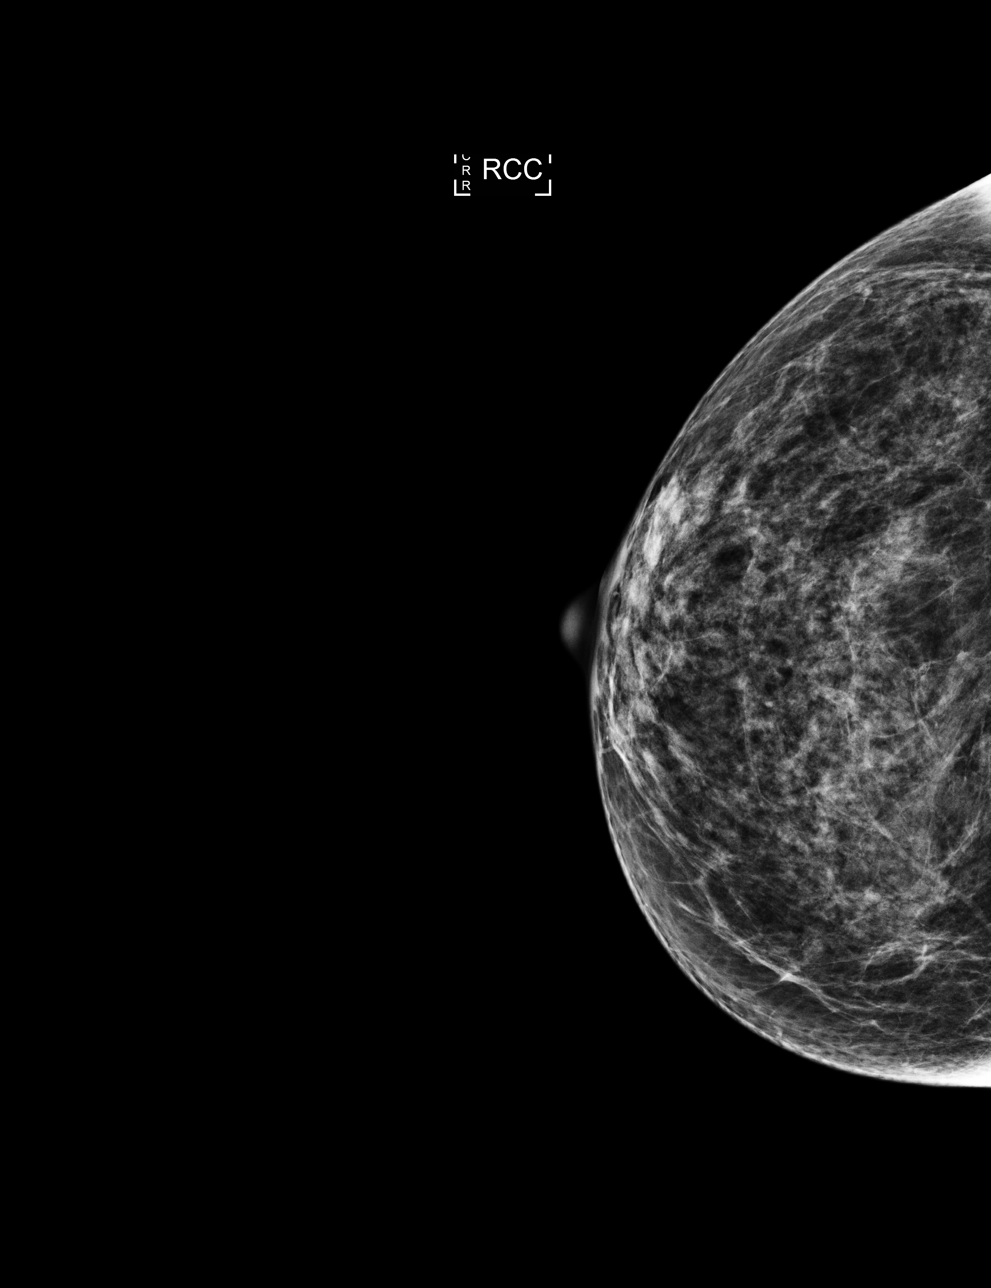

[L MLO]
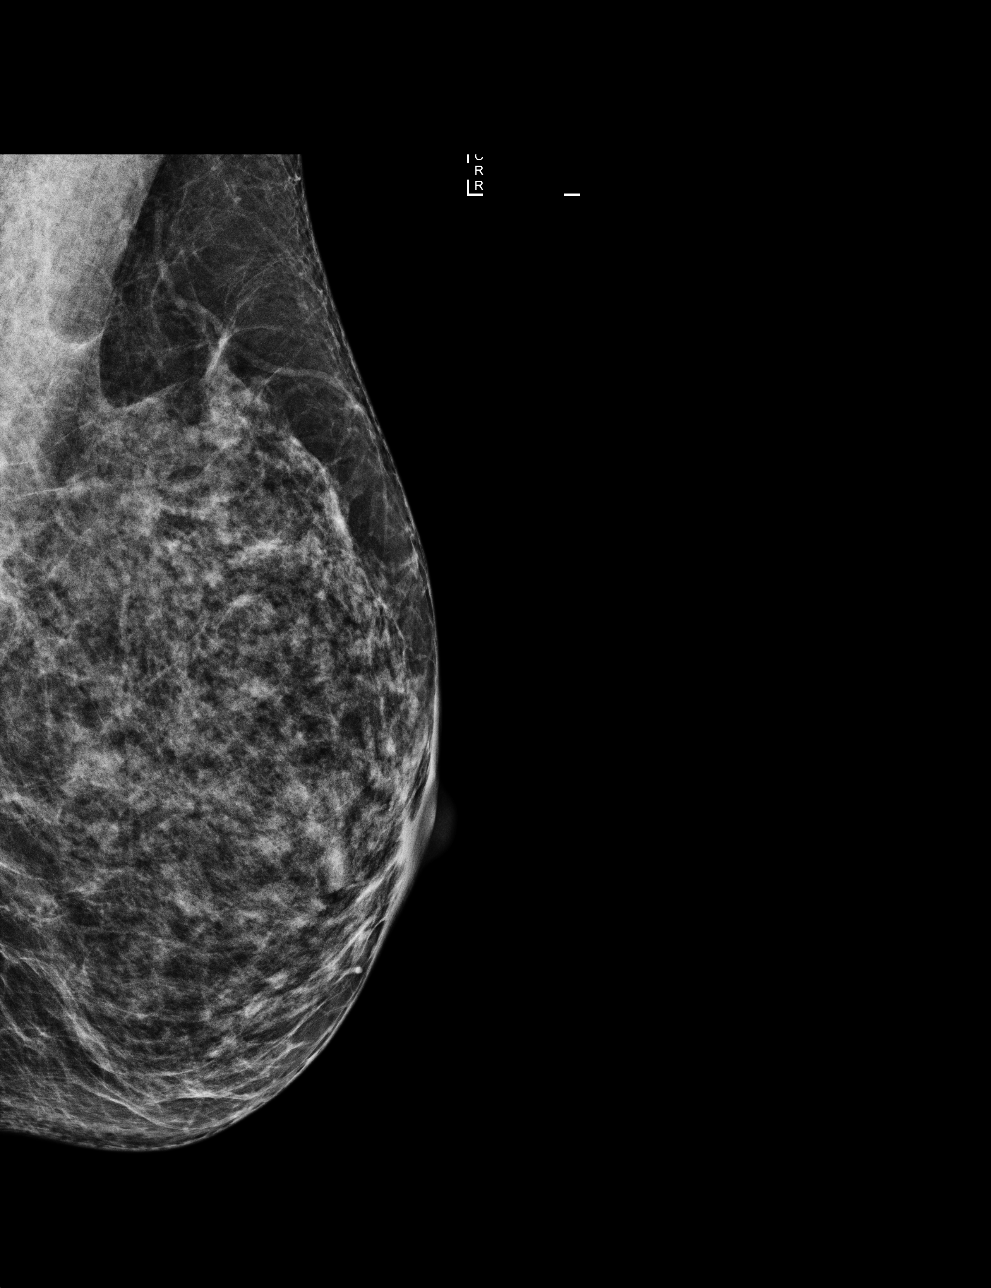

[4 of 4 positions shown; findings below may reference images not displayed]

ACR Breast Density Category c: The breast tissue is heterogeneously
dense, which may obscure small masses.
FINDINGS: There are no findings suspicious for malignancy. Images were
processed with CAD.
IMPRESSION: No mammographic evidence of malignancy. A result letter of this
screening mammogram will be mailed directly to the patient.

RECOMMENDATION:
Screening mammogram in one year. (Code:[id number])

BI-RADS CATEGORY  1: Negative.

## 2018-04-18 ENCOUNTER — Encounter: Payer: Self-pay | Admitting: Nurse Practitioner

## 2018-04-18 ENCOUNTER — Ambulatory Visit (INDEPENDENT_AMBULATORY_CARE_PROVIDER_SITE_OTHER): Payer: 59 | Admitting: Nurse Practitioner

## 2018-04-18 VITALS — BP 110/68 | HR 100 | Temp 98.7°F | Resp 16 | Ht 68.0 in | Wt 129.3 lb

## 2018-04-18 DIAGNOSIS — Z87891 Personal history of nicotine dependence: Secondary | ICD-10-CM | POA: Insufficient documentation

## 2018-04-18 DIAGNOSIS — Z862 Personal history of diseases of the blood and blood-forming organs and certain disorders involving the immune mechanism: Secondary | ICD-10-CM

## 2018-04-18 DIAGNOSIS — Z13220 Encounter for screening for lipoid disorders: Secondary | ICD-10-CM

## 2018-04-18 DIAGNOSIS — Z3042 Encounter for surveillance of injectable contraceptive: Secondary | ICD-10-CM

## 2018-04-18 DIAGNOSIS — Z1231 Encounter for screening mammogram for malignant neoplasm of breast: Secondary | ICD-10-CM

## 2018-04-18 DIAGNOSIS — Z0000 Encounter for general adult medical examination without abnormal findings: Secondary | ICD-10-CM

## 2018-04-18 DIAGNOSIS — N289 Disorder of kidney and ureter, unspecified: Secondary | ICD-10-CM

## 2018-04-18 DIAGNOSIS — Z1239 Encounter for other screening for malignant neoplasm of breast: Secondary | ICD-10-CM

## 2018-04-18 MED ORDER — MEDROXYPROGESTERONE ACETATE 150 MG/ML IM SUSP: INTRAMUSCULAR | 3 refills | Status: DC

## 2018-04-18 NOTE — Patient Instructions (Addendum)
-  All patients should ensure an adequate intake of dietary calcium (1200 mg/d) and vitamin D (800 IU daily) and have 150 minutes of physical activity weekly, eat two servings of fish weekly, eat one serving of tree nuts ( cashews, pistachios, pecans, almonds.Marland Kitchen) every other day, eat 6 servings of fruit/vegetables daily and drink plenty of water and avoid sweet beverages.   - For seasonal allergies can try over the counter xyzal (Levocetirizine) or Claritin (Loratadine) or another non-drowsy antihistamine

## 2018-04-18 NOTE — Progress Notes (Addendum)
Name: Joy Ray   MRN: [id number]    DOB: 11-Oct-1966   Date:04/18/2018       Progress Note  Subjective  Chief Complaint  Chief Complaint  Patient presents with  . Annual Exam    HPI  Patient presents for annual CPE.  Diet: eats 2-3 meals a day typically 2 if she has a big lunch. Breakfast- oatmeal, egg and biscuit. Lunch- burger and baked potatoes. Cooks spaghetti, fish and shrimp. Patient eats 3 vegetables a week. Drinks water 4 16 oz bottles, with occasional mountain dew.  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    USPSTF grade A and B recommendations  Depression:  Depression screen [facility] 2/9 04/18/2018 04/10/2017 04/05/2016 02/01/2016 10/13/2015  Decreased Interest 0 0 0 0 0  Down, Depressed, Hopeless 0 0 0 0 0  PHQ - 2 Score 0 0 0 0 0   Hypertension: BP Readings from Last 3 Encounters:  04/18/18 110/68  04/10/17 124/88  04/21/16 123/86   Obesity: Wt Readings from Last 3 Encounters:  04/18/18 129 lb 4.8 oz (58.7 kg)  04/10/17 128 lb 9.6 oz (58.3 kg)  04/21/16 132 lb (59.9 kg)   BMI Readings from Last 3 Encounters:  04/18/18 19.66 kg/m  04/10/17 19.67 kg/m  04/21/16 20.07 kg/m    Alcohol: occassionally at social events. 2 glasses of wine.  Tobacco use: doesn't use.  HIV, hep B, hep C: sts after child has not had unprotected sex, no history of iv drug use, works at [employer] no incidents reported.  STD testing and prevention (chl/gon/syphilis):  sts after child has not had unprotected sex.  Intimate partner violence:denies  Sexual History/Pain during Intercourse: sexually active currently Menstrual History/LMP/Abnormal Bleeding: takes depo- no periods  Incontinence Symptoms: denies   Advanced Care Planning: A voluntary discussion about advance care planning including the explanation and discussion of advance directives.  Discussed health care proxy and Living will, and the patient was able to identify a health care proxy as W[place] Mood  [phone]([phone]).  Patient does not have a living will at present time. If patient does have living will, I have requested they bring this to the clinic to be scanned in to their chart.  Breast cancer:  HM Mammogram  Date Value Ref Range Status  07/19/2013 normal   Final     Cervical cancer screening: completed in 2018 was normal  Uses Depo for birth control, she quit smoking 1-2 years ago, no hx of CVD, negative mammo and DEXA within last 2 years.  Osteoporosis: discussed vitamin D and calcium supplements sts has plenty in diet. DEXA normal in 2017  No results found for: HMDEXASCAN  Lipids:  Lab Results  Component Value Date   CHOL 180 04/10/2017   CHOL 174 04/05/2016   Lab Results  Component Value Date   HDL 63 04/10/2017   HDL 64 04/05/2016   Lab Results  Component Value Date   LDLCALC 93 04/10/2017   LDLCALC 85 04/05/2016   Lab Results  Component Value Date   TRIG 122 04/10/2017   TRIG 127 04/05/2016   Lab Results  Component Value Date   CHOLHDL 2.9 04/10/2017   No results found for: LDLDIRECT  Glucose:  Glucose  Date Value Ref Range Status  04/10/2017 89 65 - 99 mg/dL Final  04/05/2016 95 65 - 99 mg/dL Final    Skin cancer: no suspicious moles or rashes.  Colorectal cancer: 52  years old. Lung cancer:  Low Dose CT Chest recommended if Age 78-80 years, 30 pack-year currently smoking OR have quit w/in 15years. Patient does not qualify.  Smoked less than a pack a day for 20 years; quit 2 years ago.  Aspirin: does not necessitate per calculator    Patient Active Problem List   Diagnosis Date Noted  . Preventative health care 04/30/2016  . Special screening for malignant neoplasms, colon   . Second degree hemorrhoids   . Colon cancer screening 04/05/2016  . Screening for cervical cancer 04/05/2016  . Annual physical exam 02/02/2016  . Need for immunization against influenza 02/02/2016  . Allergic rhinitis 10/13/2015  . Cigarette smoker 10/13/2015  .  H/O abnormal cervical Papanicolaou smear 10/13/2015  . Hypertension goal BP (blood pressure) < 140/90 10/13/2015  . Encounter for surveillance of injectable contraceptive 10/13/2015    Past Surgical History:  Procedure Laterality Date  . COLONOSCOPY WITH PROPOFOL N/A 04/21/2016   Procedure: COLONOSCOPY WITH PROPOFOL;  Surgeon: Lucilla Lame, MD;  Location: [place];  Service: Endoscopy;  Laterality: N/A;  . TUBAL LIGATION  1994   After G1P1001    Family History  Problem Relation Age of Onset  . Cancer Mother        breast  . Breast cancer Mother 42  . Breast cancer Maternal Aunt     Social History   Socioeconomic History  . Marital status: Single    Spouse name: Not on file  . Number of children: 1  . Years of education: Not on file  . Highest education level: Not on file  Occupational History  . Not on file  Social Needs  . Financial resource strain: Not hard at all  . Food insecurity:    Worry: Never true    Inability: Never true  . Transportation needs:    Medical: No    Non-medical: No  Tobacco Use  . Smoking status: Former Smoker    Packs/day: 0.50    Years: 20.00    Pack years: 10.00    Types: Cigarettes    Last attempt to quit: 04/21/2016    Years since quitting: 1.9  . Smokeless tobacco: Never Used  . Tobacco comment: pt has cut back to 1 or 2 cigs/day  Substance and Sexual Activity  . Alcohol use: Yes    Alcohol/week: 0.6 oz    Types: 1 Glasses of wine per week    Comment:    . Drug use: No  . Sexual activity: Not Currently  Lifestyle  . Physical activity:    Days per week: 0 days    Minutes per session: 0 min  . Stress: Not at all  Relationships  . Social connections:    Talks on phone: More than three times a week    Gets together: More than three times a week    Attends religious service: More than 4 times per year    Active member of club or organization: Not on file    Attends meetings of clubs or organizations: Never     Relationship status: Never married  . Intimate partner violence:    Fear of current or ex partner: Not on file    Emotionally abused: No    Physically abused: No    Forced sexual activity: No  Other Topics Concern  . Not on file  Social History Narrative  . Not on file     Current Outpatient Medications:  .  fluticasone (FLONASE) 50 MCG/ACT nasal spray, instill 2 sprays  into each nostril once daily, Disp: 48 g, Rfl: 0 .  medroxyPROGESTERone (DEPO-PROVERA) 150 MG/ML injection, Inject 150 mg intramuscularly every thirteen weeks, Disp: 1 mL, Rfl: 3  Allergies  Allergen Reactions  . Ciprofloxacin     Pt denies     ROS  Constitutional: Negative for fever or weight change.  Respiratory: Positive for cough dry- allergies Negative and shortness of breath.   Cardiovascular: Negative for chest pain or palpitations.  Gastrointestinal: Negative for abdominal pain, no bowel changes.  Musculoskeletal: Negative for gait problem or joint swelling.  Skin: Negative for rash.  Neurological: Negative for dizziness or headache.  No other specific complaints in a complete review of systems (except as listed in HPI above).   Objective  Vitals:   04/18/18 0828  BP: 110/68  Pulse: 100  Resp: 16  Temp: 98.7 F (37.1 C)  TempSrc: Oral  SpO2: 97%  Weight: 129 lb 4.8 oz (58.7 kg)  Height: 5\' 8"  (1.727 m)    Body mass index is 19.66 kg/m.  Physical Exam  Constitutional: Patient appears well-developed and well-nourished. No distress.  HENT: Head: Normocephalic and atraumatic. Ears: B TMs ok, no erythema or effusion; Nose: Nose normal. Mouth/Throat: Oropharynx is clear and moist. No oropharyngeal exudate.  Eyes: Conjunctivae and EOM are normal. Pupils are equal, round, and reactive to light. No scleral icterus.  Neck: Normal range of motion. Neck supple. No JVD present. No thyromegaly present.  Cardiovascular: Normal rate, regular rhythm and normal heart sounds.  No murmur heard. No BLE  edema. Pulmonary/Chest: Effort normal and breath sounds normal. No respiratory distress. Abdominal: Soft. Bowel sounds are normal, no distension. There is no tenderness. no masses Breast: no lumps or masses, no nipple discharge or rashes FEMALE GENITALIA: deferred patient preference, no sexual partners since last pap  last year- normal Musculoskeletal: Normal range of motion, no joint effusions. No gross deformities Neurological: she is alert and oriented to person, place, and time. No cranial nerve deficit. Coordination, balance, strength, speech and gait are normal.  Skin: Skin is warm and dry. No rash noted. No erythema.  Psychiatric: Patient has a normal mood and affect. behavior is normal. Judgment and thought content normal.   No results found for this or any previous visit (from the past 2160 hour(s)).    PHQ2/9: Depression screen [facility] 2/9 04/18/2018 04/10/2017 04/05/2016 02/01/2016 10/13/2015  Decreased Interest 0 0 0 0 0  Down, Depressed, Hopeless 0 0 0 0 0  PHQ - 2 Score 0 0 0 0 0     Fall Risk: Fall Risk  04/18/2018 04/10/2017 04/05/2016 02/01/2016 10/13/2015  Falls in the past year? No No No No No     Functional Status Survey: Is the patient deaf or have difficulty hearing?: No Does the patient have difficulty seeing, even when wearing glasses/contacts?: No Does the patient have difficulty concentrating, remembering, or making decisions?: No Does the patient have difficulty walking or climbing stairs?: No Does the patient have difficulty dressing or bathing?: No Does the patient have difficulty doing errands alone such as visiting a doctor's office or shopping?: No   Assessment & Plan  1. Routine general medical examination at a health care facility  - Lipid Profile - COMPLETE METABOLIC PANEL WITH GFR - CBC with Differential  2. Screening for lipid disorders  - Lipid Profile  3. Renal insufficiency  - COMPLETE METABOLIC PANEL WITH GFR  4. History of anemia  -  CBC with Differential  5. Encounter for surveillance of  injectable contraceptive - medroxyPROGESTERone (DEPO-PROVERA) 150 MG/ML injection; Inject 150 mg intramuscularly every thirteen weeks  Dispense: 1 mL; Refill: 3  6. Screening for breast cancer - MM Digital Screening; Future   -USPSTF grade A and B recommendations reviewed with patient; age-appropriate recommendations, preventive care, screening tests, etc discussed and encouraged; healthy living encouraged; see AVS for patient education given to patient -Discussed importance of 150 minutes of physical activity weekly, eat two servings of fish weekly, eat one serving of tree nuts ( cashews, pistachios, pecans, almonds.Marland Kitchen) every other day, eat 6 servings of fruit/vegetables daily and drink plenty of water and avoid sweet beverages.  -Red flags and when to present for emergency care or RTC including fever >101.55F, chest pain, shortness of breath, new/worsening/un-resolving symptoms, reviewed with patient at time of visit. Follow up and care instructions discussed and provided in AVS. -Reviewed Health Maintenance: declines HIV testing  ----------------------------------------- I have reviewed this encounter including the documentation in this note and/or discussed this patient with the provider, Suezanne Cheshire DNP AGNP-C. I am certifying that I agree with the content of this note as supervising physician. Enid Derry, [facility] 04/22/2018, 8:19 PM

## 2018-04-23 ENCOUNTER — Telehealth: Payer: Self-pay | Admitting: Nurse Practitioner

## 2018-04-23 NOTE — Telephone Encounter (Signed)
Please request pt get labs done. Orders placed.

## 2018-05-11 NOTE — Telephone Encounter (Signed)
Patient notified, will come in next week

## 2018-05-16 ENCOUNTER — Ambulatory Visit
Admission: RE | Admit: 2018-05-16 | Discharge: 2018-05-16 | Disposition: A | Discharge status: Home / Self Care | Payer: 59 | Source: Ambulatory Visit | Attending: Nurse Practitioner | Admitting: Nurse Practitioner

## 2018-05-16 DIAGNOSIS — Z1231 Encounter for screening mammogram for malignant neoplasm of breast: Secondary | ICD-10-CM | POA: Insufficient documentation

## 2018-05-16 DIAGNOSIS — Z1239 Encounter for other screening for malignant neoplasm of breast: Secondary | ICD-10-CM

## 2018-05-17 ENCOUNTER — Other Ambulatory Visit: Payer: Self-pay | Admitting: Family Medicine

## 2018-05-17 DIAGNOSIS — R928 Other abnormal and inconclusive findings on diagnostic imaging of breast: Secondary | ICD-10-CM

## 2018-05-17 NOTE — Progress Notes (Signed)
Orders Placed This Encounter  Procedures  . MM DIAG BREAST TOMO UNI RIGHT    Standing Status:   Future    Standing Expiration Date:   08/17/2018    Order Specific Question:   Reason for Exam (SYMPTOM  OR DIAGNOSIS REQUIRED)    Answer:   abnormality on mammogram RIGHT side    Order Specific Question:   Is the patient pregnant?    Answer:   No    Order Specific Question:   Preferred imaging location?    Answer:   [facility]  . US BREAST LTD UNI RIGHT INC AXILLA    Standing Status:   Future    Standing Expiration Date:   08/17/2018    Order Specific Question:   Reason for Exam (SYMPTOM  OR DIAGNOSIS REQUIRED)    Answer:   abnormality on mammogram, RIGHT side    Order Specific Question:   Preferred imaging location?    Answer:   [facility]

## 2018-05-21 ENCOUNTER — Other Ambulatory Visit: Payer: Self-pay | Admitting: Family Medicine

## 2018-05-21 DIAGNOSIS — N289 Disorder of kidney and ureter, unspecified: Secondary | ICD-10-CM | POA: Diagnosis not present

## 2018-05-21 DIAGNOSIS — Z13220 Encounter for screening for lipoid disorders: Secondary | ICD-10-CM | POA: Diagnosis not present

## 2018-05-21 DIAGNOSIS — Z0000 Encounter for general adult medical examination without abnormal findings: Secondary | ICD-10-CM | POA: Diagnosis not present

## 2018-05-21 NOTE — Telephone Encounter (Signed)
Depo just refilled on 04/18/18 Too soon for refill

## 2018-05-24 LAB — CBC WITH DIFFERENTIAL/PLATELET
BASOS ABS: 0 10*3/uL (ref 0.0–0.2)
Basos: 1 %
EOS (ABSOLUTE): 0.1 10*3/uL (ref 0.0–0.4)
EOS: 2 %
Hematocrit: 39.9 % (ref 34.0–46.6)
Hemoglobin: 13.4 g/dL (ref 11.1–15.9)
IMMATURE GRANULOCYTES: 0 %
Immature Grans (Abs): 0 10*3/uL (ref 0.0–0.1)
LYMPHS ABS: 1.8 10*3/uL (ref 0.7–3.1)
Lymphs: 30 %
MCH: 34 pg — ABNORMAL HIGH (ref 26.6–33.0)
MCHC: 33.6 g/dL (ref 31.5–35.7)
MCV: 101 fL — ABNORMAL HIGH (ref 79–97)
MONOCYTES: 9 %
Monocytes Absolute: 0.5 10*3/uL (ref 0.1–0.9)
NEUTROS PCT: 58 %
Neutrophils Absolute: 3.4 10*3/uL (ref 1.4–7.0)
Platelets: 266 10*3/uL (ref 150–450)
RBC: 3.94 x10E6/uL (ref 3.77–5.28)
RDW: 13.5 % (ref 12.3–15.4)
WBC: 5.8 10*3/uL (ref 3.4–10.8)

## 2018-05-24 LAB — COMPREHENSIVE METABOLIC PANEL
ALK PHOS: 51 IU/L (ref 39–117)
ALT: 16 IU/L (ref 0–32)
AST: 13 IU/L (ref 0–40)
Albumin/Globulin Ratio: 1.6 (ref 1.2–2.2)
Albumin: 4 g/dL (ref 3.5–5.5)
BUN/Creatinine Ratio: 14 (ref 9–23)
BUN: 15 mg/dL (ref 6–24)
Bilirubin Total: 0.3 mg/dL (ref 0.0–1.2)
CALCIUM: 8.7 mg/dL (ref 8.7–10.2)
CO2: 20 mmol/L (ref 20–29)
Chloride: 105 mmol/L (ref 96–106)
Creatinine, Ser: 1.11 mg/dL — ABNORMAL HIGH (ref 0.57–1.00)
GFR calc Af Amer: 67 mL/min/{1.73_m2} (ref >59)
GFR, EST NON AFRICAN AMERICAN: 58 mL/min/{1.73_m2} — AB (ref >59)
GLOBULIN, TOTAL: 2.5 g/dL (ref 1.5–4.5)
Glucose: 85 mg/dL (ref 65–99)
POTASSIUM: 3.8 mmol/L (ref 3.5–5.2)
SODIUM: 140 mmol/L (ref 134–144)
Total Protein: 6.5 g/dL (ref 6.0–8.5)

## 2018-05-24 LAB — LIPID PANEL W/O CHOL/HDL RATIO
Cholesterol, Total: 165 mg/dL (ref 100–199)
HDL: 55 mg/dL (ref >39)
LDL Calculated: 91 mg/dL (ref 0–99)
TRIGLYCERIDES: 93 mg/dL (ref 0–149)
VLDL Cholesterol Cal: 19 mg/dL (ref 5–40)

## 2018-05-25 ENCOUNTER — Telehealth: Payer: Self-pay | Admitting: Family Medicine

## 2018-05-25 NOTE — Telephone Encounter (Signed)
Copied from [place] [id number]. Topic: General - Other >> May 25, 2018 12:01 PM Yvette Rack wrote: Reason for CRM: pt calling back about lab results if the message doesn't say that the [facility] can give out results than the nurse can't give pt the results pt is at work can't take messages or calls she will try to answer call

## 2018-05-28 ENCOUNTER — Ambulatory Visit: Payer: Self-pay | Admitting: *Deleted

## 2018-05-28 ENCOUNTER — Telehealth: Payer: Self-pay | Admitting: Family Medicine

## 2018-05-28 NOTE — Telephone Encounter (Signed)
Called in and was given lab results ordered by Dr. Sanda Klein on 05/24/18.  Cholesterol is well-controlled, liver function and electrolytes are normal; no anemia; her red blood cells are a little large and dense.  We can see that with P29 deficiency, folic acid deficiency, or excessive alcohol intake. She can try taking a multiple vitamin with B12 and folate in it.   If she drinks any alcohol, suggest she decrease her intake by about 50%.    Kidney function has decreased.  Would  like to schedule appt in 3 months and repeat kidney function.  I scheduled her for 08/30/18 with Dr. Sanda Klein at 3:20 for 3 month kidney function follow up.

## 2018-05-28 NOTE — Telephone Encounter (Signed)
Patient left message to call office. [facility] may release results

## 2018-05-28 NOTE — Telephone Encounter (Signed)
Opened in error

## 2018-05-31 ENCOUNTER — Ambulatory Visit
Admission: RE | Admit: 2018-05-31 | Discharge: 2018-05-31 | Disposition: A | Discharge status: Home / Self Care | Payer: 59 | Source: Ambulatory Visit | Attending: Family Medicine | Admitting: Family Medicine

## 2018-05-31 DIAGNOSIS — R928 Other abnormal and inconclusive findings on diagnostic imaging of breast: Secondary | ICD-10-CM | POA: Insufficient documentation

## 2018-05-31 DIAGNOSIS — R922 Inconclusive mammogram: Secondary | ICD-10-CM | POA: Diagnosis not present

## 2018-06-06 ENCOUNTER — Ambulatory Visit (INDEPENDENT_AMBULATORY_CARE_PROVIDER_SITE_OTHER): Payer: 59

## 2018-06-06 DIAGNOSIS — Z3042 Encounter for surveillance of injectable contraceptive: Secondary | ICD-10-CM

## 2018-06-06 MED ORDER — MEDROXYPROGESTERONE ACETATE 150 MG/ML IM SUSP
150.0000 mg | INTRAMUSCULAR | Status: DC
  Administered 2018-06-06 – 2018-11-28 (×2): 150 mg via INTRAMUSCULAR

## 2018-06-06 NOTE — Progress Notes (Signed)
Patient is here for her depo injection. Her last injection was done on March 21. She was due for her next injection between June 12-June 26. She is on time for her injection. No urine pregnancy test collected. Her next injection will be due Sept 4-Sept 18. She tolerated injection well and was informed of next due date of Sept. 4.

## 2018-06-12 ENCOUNTER — Other Ambulatory Visit: Payer: Self-pay | Admitting: Nurse Practitioner

## 2018-06-12 DIAGNOSIS — Z9229 Personal history of other drug therapy: Secondary | ICD-10-CM

## 2018-06-12 DIAGNOSIS — Z131 Encounter for screening for diabetes mellitus: Secondary | ICD-10-CM

## 2018-06-25 ENCOUNTER — Telehealth: Payer: Self-pay

## 2018-06-25 NOTE — Telephone Encounter (Signed)
faxed

## 2018-06-25 NOTE — Telephone Encounter (Signed)
Copied from [place] [phone]. Topic: Bill or Statement - Patient/Guarantor Inquiry >> Jun 25, 2018  9:18 AM Boyd Kerbs wrote: [facility] is needing a copy of the actual blood test results.  Fax # [phone] >> Jun 25, 2018  9:34 AM Jerene Dilling H wrote: Centralized coding is unable to see coding or billing for labs. Please send to the office.   Thank you,

## 2018-08-23 ENCOUNTER — Other Ambulatory Visit: Payer: Self-pay | Admitting: Family Medicine

## 2018-08-23 NOTE — Telephone Encounter (Signed)
Last 5/1 Next 9/12

## 2018-08-30 ENCOUNTER — Ambulatory Visit (INDEPENDENT_AMBULATORY_CARE_PROVIDER_SITE_OTHER): Payer: 59 | Admitting: Family Medicine

## 2018-08-30 ENCOUNTER — Encounter: Payer: Self-pay | Admitting: Family Medicine

## 2018-08-30 VITALS — BP 130/74 | HR 92 | Ht 68.0 in | Wt 128.0 lb

## 2018-08-30 DIAGNOSIS — N289 Disorder of kidney and ureter, unspecified: Secondary | ICD-10-CM | POA: Diagnosis not present

## 2018-08-30 DIAGNOSIS — Z3042 Encounter for surveillance of injectable contraceptive: Secondary | ICD-10-CM | POA: Diagnosis not present

## 2018-08-30 MED ORDER — MEDROXYPROGESTERONE ACETATE 150 MG/ML IM SUSP
150.0000 mg | Freq: Once | INTRAMUSCULAR | Status: AC
  Administered 2018-08-30: 150 mg via INTRAMUSCULAR

## 2018-08-30 NOTE — Progress Notes (Signed)
   BP 130/74   Pulse 92   Ht 5\' 8"  (1.727 m)   Wt 128 lb (58.1 kg)   BMI 19.46 kg/m    Subjective:    Patient ID: Joy Ray, female    DOB: 10/26/1966, 52 y.o.   MRN: [id number]  HPI: Joy Ray is a 52 y.o. female  Chief Complaint  Patient presents with  . Follow-up    HPI Patient is here for f/u of decreased kidney function However, patient did not want to be seen by the doctor and was upset that she was asked to stay for a visit She wanted to just get labs and then talk later if needed; she really wanted to go  Depression screen [facility] 2/9 08/30/2018 04/18/2018 04/10/2017 04/05/2016 02/01/2016  Decreased Interest 0 0 0 0 0  Down, Depressed, Hopeless 0 0 0 0 0  PHQ - 2 Score 0 0 0 0 0   Review of Systems Per HPI unless specifically indicated above     Objective:    BP 130/74   Pulse 92   Ht 5\' 8"  (1.727 m)   Wt 128 lb (58.1 kg)   BMI 19.46 kg/m   Wt Readings from Last 3 Encounters:  08/30/18 128 lb (58.1 kg)  04/18/18 129 lb 4.8 oz (58.7 kg)  04/10/17 128 lb 9.6 oz (58.3 kg)    Physical Exam     Assessment & Plan:   Problem List Items Addressed This Visit      Other   Encounter for surveillance of injectable contraceptive - Primary   Relevant Medications   medroxyPROGESTERone (DEPO-PROVERA) injection 150 mg (Completed)    Other Visit Diagnoses    Renal insufficiency       Relevant Orders   Basic Metabolic Panel (BMET)   Basic metabolic panel      Patient was very upset when asked to see the provider for a visit; she just wanted labs and the Depo and wanted to go; I explained that I wanted to ask questions, have a visit to talk about her kidneys, but she was obviously not interested and wanted to just get labs drawn and leave; she wanted them done through [place]; I explained that [facility] is no longer here in the building with [place]; it was offered for her to have [facility] draw her blood and send it to [place], but there may be a drawing fee;  she did not want to do that and said it was really inconvenient; I offered to look up [facility] draw stations near her work, but she said she only gets a 30 minute lunch; she took the lab paper and said never mind; I told her we were here to try to help and she muttered something as she left that I did not hear  Follow up plan: No follow-ups on file.

## 2018-08-31 LAB — BASIC METABOLIC PANEL
BUN/Creatinine Ratio: 7 — ABNORMAL LOW (ref 9–23)
BUN: 7 mg/dL (ref 6–24)
CHLORIDE: 102 mmol/L (ref 96–106)
CO2: 21 mmol/L (ref 20–29)
Calcium: 9.5 mg/dL (ref 8.7–10.2)
Creatinine, Ser: 0.96 mg/dL (ref 0.57–1.00)
GFR calc Af Amer: 80 mL/min/{1.73_m2} (ref >59)
GFR calc non Af Amer: 69 mL/min/{1.73_m2} (ref >59)
GLUCOSE: 112 mg/dL — AB (ref 65–99)
POTASSIUM: 3.9 mmol/L (ref 3.5–5.2)
SODIUM: 139 mmol/L (ref 134–144)

## 2018-11-28 ENCOUNTER — Ambulatory Visit (INDEPENDENT_AMBULATORY_CARE_PROVIDER_SITE_OTHER): Payer: 59

## 2018-11-28 DIAGNOSIS — Z3042 Encounter for surveillance of injectable contraceptive: Secondary | ICD-10-CM

## 2018-11-29 ENCOUNTER — Ambulatory Visit: Payer: 59

## 2019-02-25 ENCOUNTER — Ambulatory Visit (INDEPENDENT_AMBULATORY_CARE_PROVIDER_SITE_OTHER): Payer: 59

## 2019-02-25 ENCOUNTER — Other Ambulatory Visit: Payer: Self-pay

## 2019-02-25 DIAGNOSIS — Z3042 Encounter for surveillance of injectable contraceptive: Secondary | ICD-10-CM | POA: Diagnosis not present

## 2019-02-25 MED ORDER — MEDROXYPROGESTERONE ACETATE 150 MG/ML IM SUSP
150.0000 mg | Freq: Once | INTRAMUSCULAR | Status: AC
  Administered 2019-02-25: 150 mg via INTRAMUSCULAR

## 2019-02-25 MED ORDER — MEDROXYPROGESTERONE ACETATE 150 MG/ML IM SUSP: 150.0000 mg | Freq: Once | INTRAMUSCULAR | Status: DC

## 2019-02-27 ENCOUNTER — Ambulatory Visit: Payer: 59

## 2019-05-02 ENCOUNTER — Other Ambulatory Visit: Payer: Self-pay | Admitting: Nurse Practitioner

## 2019-05-02 DIAGNOSIS — Z3042 Encounter for surveillance of injectable contraceptive: Secondary | ICD-10-CM

## 2019-05-28 ENCOUNTER — Ambulatory Visit (INDEPENDENT_AMBULATORY_CARE_PROVIDER_SITE_OTHER): Payer: 59

## 2019-05-28 ENCOUNTER — Other Ambulatory Visit: Payer: Self-pay

## 2019-05-28 DIAGNOSIS — Z3042 Encounter for surveillance of injectable contraceptive: Secondary | ICD-10-CM | POA: Diagnosis not present

## 2019-05-29 LAB — POCT URINE PREGNANCY: Preg Test, Ur: NEGATIVE

## 2019-05-29 IMAGING — MG MM DIGITAL DIAGNOSTIC UNILAT*R* W/ TOMO W/ CAD
4 series · 4 of 12 positions shown · non-contrast
Comparison: 05/16/2018 and earlier priors

CLINICAL DATA: 52-year-old patient was recalled from recent
screening mammogram for evaluation of a possible asymmetry in the
posterior right breast, at the level of the nipple seen in the MLO
view only.

EXAM:
DIGITAL DIAGNOSTIC UNILATERAL RIGHT MAMMOGRAM WITH CAD AND TOMO

[R CC synth-2D]
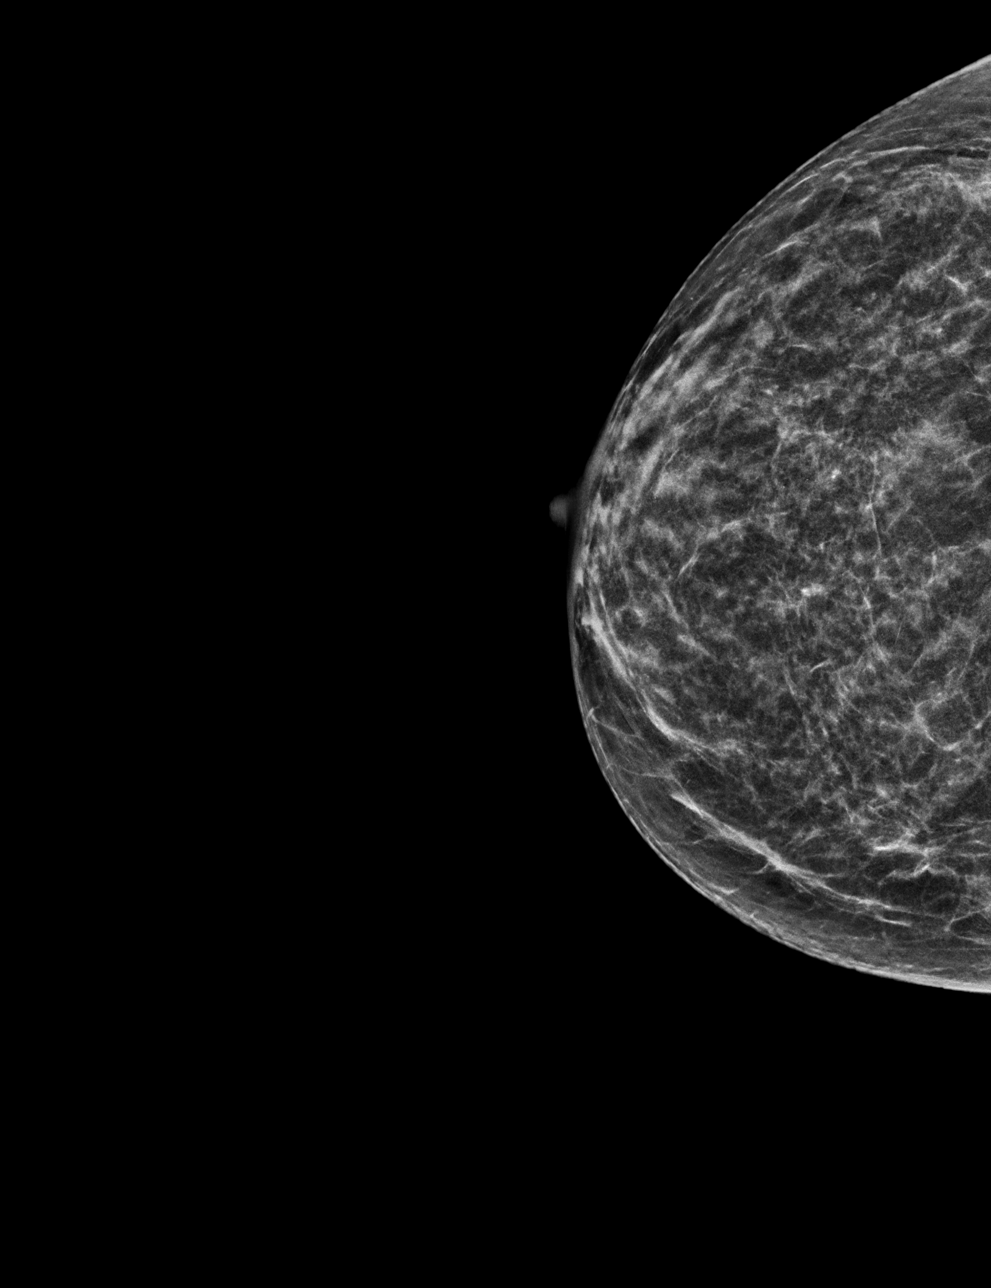

[R MLO synth-2D]
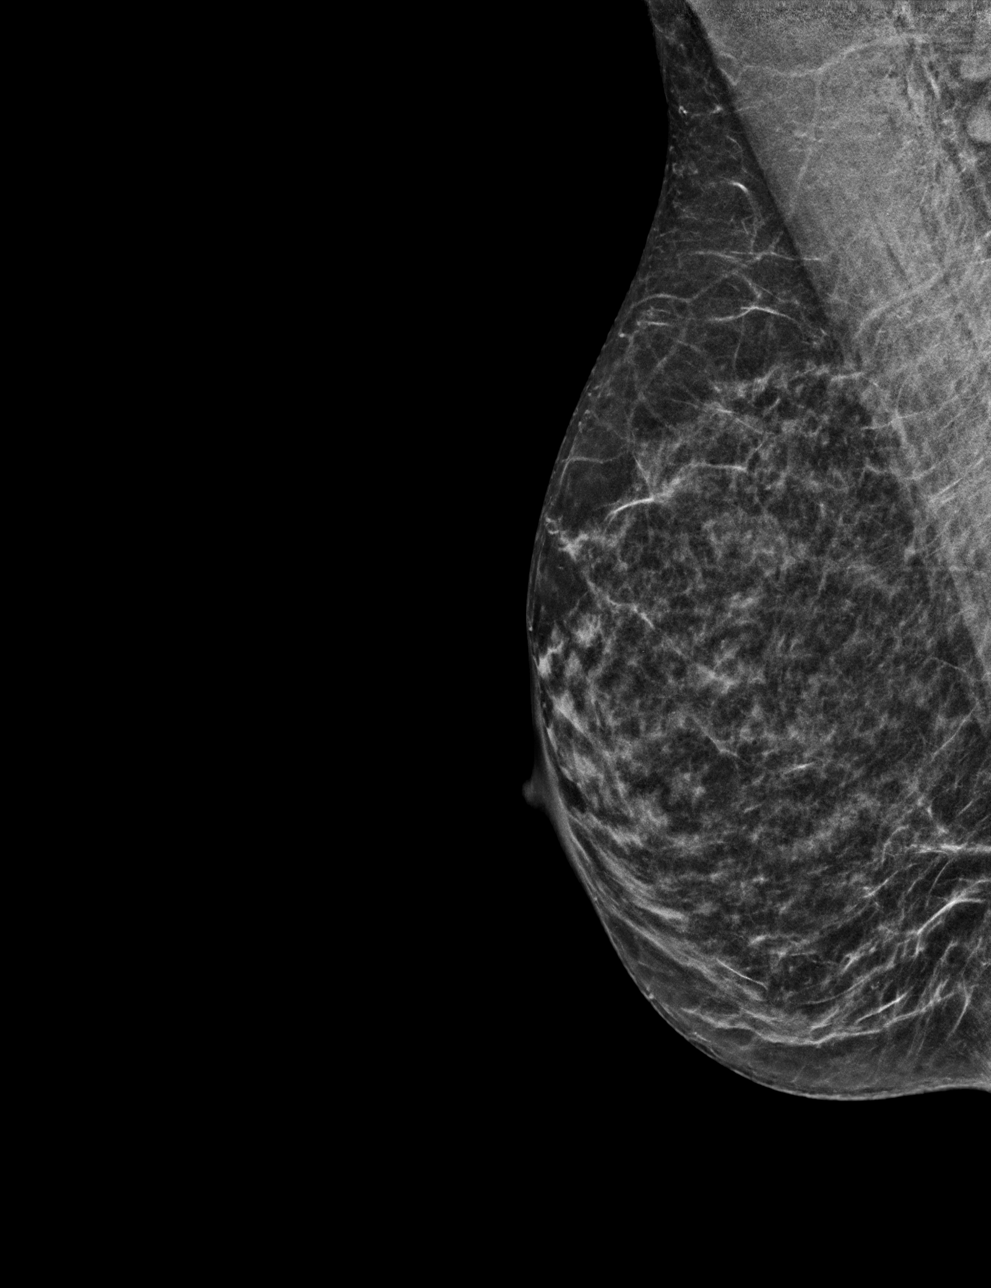

[R CC tomo · tomo slice 21/42.0]
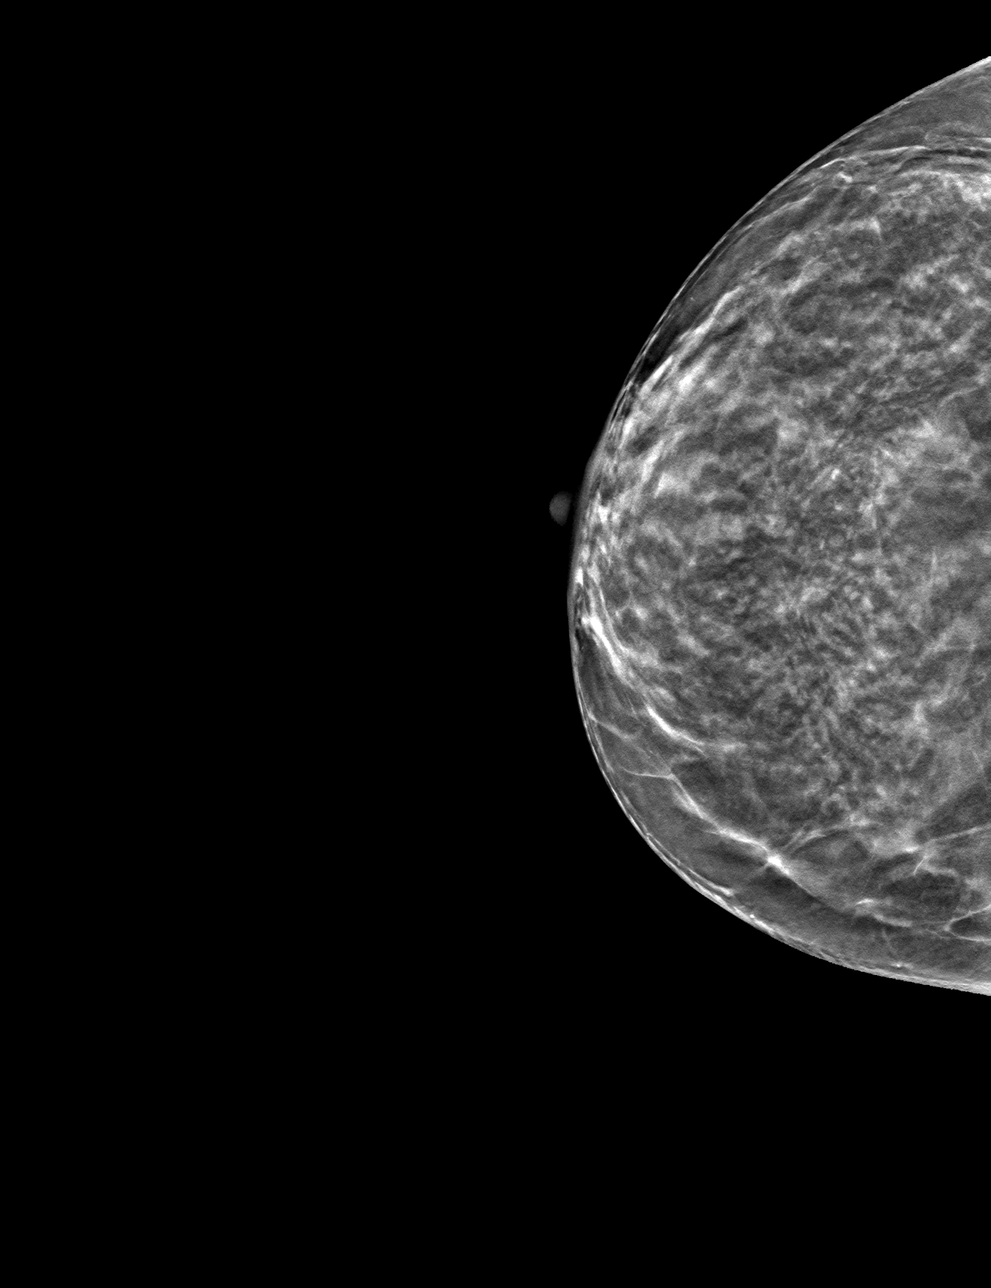

[R MLO tomo · tomo slice 23/45.0]
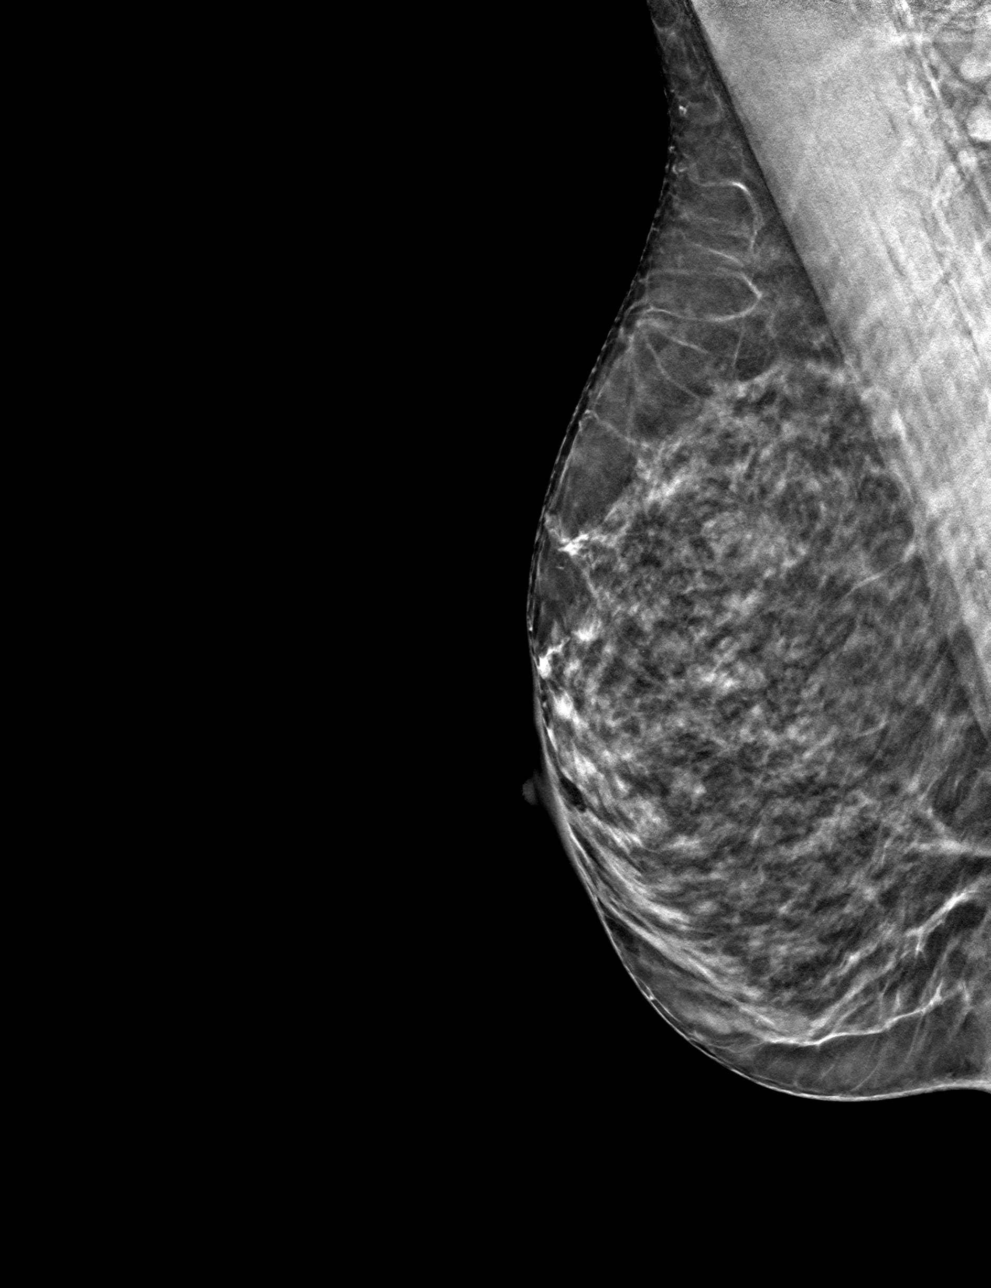

[4 of 12 positions shown; findings below may reference images not displayed]

ACR Breast Density Category c: The breast tissue is heterogeneously
dense, which may obscure small masses.
FINDINGS: Repeat whole breast MLO view with tomography shows no persistent
asymmetry. Normal fibroglandular tissue is seen throughout the right
breast the MLO and CC projections with tomography. No mass or
architectural distortion.

Mammographic images were processed with CAD.
IMPRESSION: No evidence of malignancy in the right breast.

RECOMMENDATION:
Screening mammogram in one year.(Code:[id number]-8N7)

I have discussed the findings and recommendations with the patient.
Results were also provided in writing at the conclusion of the
visit. If applicable, a reminder letter will be sent to the patient
regarding the next appointment.

BI-RADS CATEGORY  1: Negative.

## 2019-05-29 MED ORDER — MEDROXYPROGESTERONE ACETATE 150 MG/ML IM SUSP
150.0000 mg | Freq: Once | INTRAMUSCULAR | Status: AC
  Administered 2019-05-28: 150 mg via INTRAMUSCULAR

## 2019-07-30 ENCOUNTER — Other Ambulatory Visit: Payer: Self-pay | Admitting: Nurse Practitioner

## 2019-07-30 DIAGNOSIS — Z3042 Encounter for surveillance of injectable contraceptive: Secondary | ICD-10-CM

## 2019-08-27 ENCOUNTER — Ambulatory Visit (INDEPENDENT_AMBULATORY_CARE_PROVIDER_SITE_OTHER): Payer: 59

## 2019-08-27 ENCOUNTER — Other Ambulatory Visit: Payer: Self-pay

## 2019-08-27 DIAGNOSIS — Z3042 Encounter for surveillance of injectable contraceptive: Secondary | ICD-10-CM | POA: Diagnosis not present

## 2019-08-27 MED ORDER — MEDROXYPROGESTERONE ACETATE 150 MG/ML IM SUSY
150.0000 mg | PREFILLED_SYRINGE | Freq: Once | INTRAMUSCULAR | Status: AC
  Administered 2019-08-27: 150 mg via INTRAMUSCULAR

## 2019-08-27 NOTE — Progress Notes (Signed)
Date last pap: 04/18/2018  Last Depo-Provera: 05/28/2019 Side Effects if any: N/A Serum HCG indicated? No Depo-Provera 150 mg IM given by: Vonna Kotyk, CMA  Next appointment due 11/12/2019 through 11/26/2019  Patient also requested a refill and scheduled her CPE with Threasa Alpha for 11/12/2019

## 2019-11-07 ENCOUNTER — Other Ambulatory Visit: Payer: Self-pay

## 2019-11-07 ENCOUNTER — Telehealth: Payer: Self-pay | Admitting: Family Medicine

## 2019-11-07 DIAGNOSIS — Z3042 Encounter for surveillance of injectable contraceptive: Secondary | ICD-10-CM

## 2019-11-07 NOTE — Telephone Encounter (Incomplete)
°  Medication Refill - Medication: Medroxy Progestone 150 mg  Has the patient contacted their pharmacy? Yes.   (Agent: If no, request that the patient contact the pharmacy for the refill.) (Agent: If yes, when and what did the pharmacy advise?)  Preferred Pharmacy (with phone number or street name): [facility] [address]  Agent: Please be advised that RX refills may take up to 3 business days. We ask that you follow-up with your pharmacy.

## 2019-11-08 MED ORDER — MEDROXYPROGESTERONE ACETATE 150 MG/ML IM SUSP: INTRAMUSCULAR | 3 refills | Status: DC

## 2019-11-08 NOTE — Telephone Encounter (Signed)
Pt has an appt on 11-12-2019 and will need medication to bring to her appt

## 2019-11-12 ENCOUNTER — Encounter: Payer: Self-pay | Admitting: Family Medicine

## 2019-11-12 ENCOUNTER — Ambulatory Visit (INDEPENDENT_AMBULATORY_CARE_PROVIDER_SITE_OTHER): Payer: 59 | Admitting: Family Medicine

## 2019-11-12 ENCOUNTER — Other Ambulatory Visit: Payer: Self-pay

## 2019-11-12 VITALS — BP 200/120 | HR 89 | Temp 96.9°F | Wt 130.6 lb

## 2019-11-12 DIAGNOSIS — Z13220 Encounter for screening for lipoid disorders: Secondary | ICD-10-CM | POA: Diagnosis not present

## 2019-11-12 DIAGNOSIS — Z124 Encounter for screening for malignant neoplasm of cervix: Secondary | ICD-10-CM

## 2019-11-12 DIAGNOSIS — Z0000 Encounter for general adult medical examination without abnormal findings: Secondary | ICD-10-CM | POA: Diagnosis not present

## 2019-11-12 DIAGNOSIS — Z13228 Encounter for screening for other metabolic disorders: Secondary | ICD-10-CM

## 2019-11-12 DIAGNOSIS — Z130 Encounter for screening for diseases of the blood and blood-forming organs and certain disorders involving the immune mechanism: Secondary | ICD-10-CM

## 2019-11-12 DIAGNOSIS — Z3042 Encounter for surveillance of injectable contraceptive: Secondary | ICD-10-CM

## 2019-11-12 DIAGNOSIS — I160 Hypertensive urgency: Secondary | ICD-10-CM

## 2019-11-12 DIAGNOSIS — Z1329 Encounter for screening for other suspected endocrine disorder: Secondary | ICD-10-CM | POA: Diagnosis not present

## 2019-11-12 DIAGNOSIS — Z1231 Encounter for screening mammogram for malignant neoplasm of breast: Secondary | ICD-10-CM

## 2019-11-12 DIAGNOSIS — F17200 Nicotine dependence, unspecified, uncomplicated: Secondary | ICD-10-CM

## 2019-11-12 MED ORDER — MEDROXYPROGESTERONE ACETATE 150 MG/ML IM SUSY
150.0000 mg | PREFILLED_SYRINGE | INTRAMUSCULAR | Status: AC
  Administered 2019-11-12: 09:00:00 150 mg via INTRAMUSCULAR

## 2019-11-12 NOTE — Patient Instructions (Addendum)
See hand out on blood pressure emergencies.  With elevated blood pressure you are at higher risk for stoke, heart attack and organ damage.  Go to the ER or call 911 if you have chest pain, shortness of breath, severe headache, confusion, slurred speech, vision changes, passing out episodes, droop to face, weakness or numbness in arms or legs.  Managing Your Hypertension Hypertension is commonly called high blood pressure. This is when the force of your blood pressing against the walls of your arteries is too strong. Arteries are blood vessels that carry blood from your heart throughout your body. Hypertension forces the heart to work harder to pump blood, and may cause the arteries to become narrow or stiff. Having untreated or uncontrolled hypertension can cause heart attack, stroke, kidney disease, and other problems. What are blood pressure readings? A blood pressure reading consists of a higher number over a lower number. Ideally, your blood pressure should be below 120/80. The first ("top") number is called the systolic pressure. It is a measure of the pressure in your arteries as your heart beats. The second ("bottom") number is called the diastolic pressure. It is a measure of the pressure in your arteries as the heart relaxes. What does my blood pressure reading mean? Blood pressure is classified into four stages. Based on your blood pressure reading, your health care provider may use the following stages to determine what type of treatment you need, if any. Systolic pressure and diastolic pressure are measured in a unit called mm Hg. Normal  Systolic pressure: below 914.  Diastolic pressure: below 80. Elevated  Systolic pressure: 782-956.  Diastolic pressure: below 80. Hypertension stage 1  Systolic pressure: 213-086.  Diastolic pressure: 57-84. Hypertension stage 2  Systolic pressure: 696 or above.  Diastolic pressure: 90 or above. What health risks are associated with  hypertension? Managing your hypertension is an important responsibility. Uncontrolled hypertension can lead to:  A heart attack.  A stroke.  A weakened blood vessel (aneurysm).  Heart failure.  Kidney damage.  Eye damage.  Metabolic syndrome.  Memory and concentration problems. What changes can I make to manage my hypertension? Hypertension can be managed by making lifestyle changes and possibly by taking medicines. Your health care provider will help you make a plan to bring your blood pressure within a normal range. Eating and drinking   Eat a diet that is high in fiber and potassium, and low in salt (sodium), added sugar, and fat. An example eating plan is called the DASH (Dietary Approaches to Stop Hypertension) diet. To eat this way: ? Eat plenty of fresh fruits and vegetables. Try to fill half of your plate at each meal with fruits and vegetables. ? Eat whole grains, such as whole wheat pasta, brown rice, or whole grain bread. Fill about one quarter of your plate with whole grains. ? Eat low-fat diary products. ? Avoid fatty cuts of meat, processed or cured meats, and poultry with skin. Fill about one quarter of your plate with lean proteins such as fish, chicken without skin, beans, eggs, and tofu. ? Avoid premade and processed foods. These tend to be higher in sodium, added sugar, and fat.  Reduce your daily sodium intake. Most people with hypertension should eat less than 1,500 mg of sodium a day.  Limit alcohol intake to no more than 1 drink a day for nonpregnant women and 2 drinks a day for men. One drink equals 12 oz of beer, 5 oz of wine, or 1 oz  of hard liquor. Lifestyle  Work with your health care provider to maintain a healthy body weight, or to lose weight. Ask what an ideal weight is for you.  Get at least 30 minutes of exercise that causes your heart to beat faster (aerobic exercise) most days of the week. Activities may include walking, swimming, or biking.   Include exercise to strengthen your muscles (resistance exercise), such as weight lifting, as part of your weekly exercise routine. Try to do these types of exercises for 30 minutes at least 3 days a week.  Do not use any products that contain nicotine or tobacco, such as cigarettes and e-cigarettes. If you need help quitting, ask your health care provider.  Control any long-term (chronic) conditions you have, such as high cholesterol or diabetes. Monitoring  Monitor your blood pressure at home as told by your health care provider. Your personal target blood pressure may vary depending on your medical conditions, your age, and other factors.  Have your blood pressure checked regularly, as often as told by your health care provider. Working with your health care provider  Review all the medicines you take with your health care provider because there may be side effects or interactions.  Talk with your health care provider about your diet, exercise habits, and other lifestyle factors that may be contributing to hypertension.  Visit your health care provider regularly. Your health care provider can help you create and adjust your plan for managing hypertension. Will I need medicine to control my blood pressure? Your health care provider may prescribe medicine if lifestyle changes are not enough to get your blood pressure under control, and if:  Your systolic blood pressure is 130 or higher.  Your diastolic blood pressure is 80 or higher. Take medicines only as told by your health care provider. Follow the directions carefully. Blood pressure medicines must be taken as prescribed. The medicine does not work as well when you skip doses. Skipping doses also puts you at risk for problems. Contact a health care provider if:  You think you are having a reaction to medicines you have taken.  You have repeated (recurrent) headaches.  You feel dizzy.  You have swelling in your ankles.  You  have trouble with your vision. Get help right away if:  You develop a severe headache or confusion.  You have unusual weakness or numbness, or you feel faint.  You have severe pain in your chest or abdomen.  You vomit repeatedly.  You have trouble breathing. Summary  Hypertension is when the force of blood pumping through your arteries is too strong. If this condition is not controlled, it may put you at risk for serious complications.  Your personal target blood pressure may vary depending on your medical conditions, your age, and other factors. For most people, a normal blood pressure is less than 120/80.  Hypertension is managed by lifestyle changes, medicines, or both. Lifestyle changes include weight loss, eating a healthy, low-sodium diet, exercising more, and limiting alcohol. This information is not intended to replace advice given to you by your health care provider. Make sure you discuss any questions you have with your health care provider. Document Released: 08/29/2012 Document Revised: 03/29/2019 Document Reviewed: 11/02/2016 Elsevier Patient Education  2020 [employer]   Preventive Care 32-2 Years Old, Female Preventive care refers to visits with your health care provider and lifestyle choices that can promote health and wellness. This includes:  A yearly physical exam. This may also be called  an annual well check.  Regular dental visits and eye exams.  Immunizations.  Screening for certain conditions.  Healthy lifestyle choices, such as eating a healthy diet, getting regular exercise, not using drugs or products that contain nicotine and tobacco, and limiting alcohol use. What can I expect for my preventive care visit? Physical exam Your health care provider will check your:  Height and weight. This may be used to calculate body mass index (BMI), which tells if you are at a healthy weight.  Heart rate and blood pressure.  Skin for abnormal spots.  Counseling Your health care provider may ask you questions about your:  Alcohol, tobacco, and drug use.  Emotional well-being.  Home and relationship well-being.  Sexual activity.  Eating habits.  Work and work Statistician.  Method of birth control.  Menstrual cycle.  Pregnancy history. What immunizations do I need?  Influenza (flu) vaccine  This is recommended every year. Tetanus, diphtheria, and pertussis (Tdap) vaccine  You may need a Td booster every 10 years. Varicella (chickenpox) vaccine  You may need this if you have not been vaccinated. Zoster (shingles) vaccine  You may need this after age 31. Measles, mumps, and rubella (MMR) vaccine  You may need at least one dose of MMR if you were born in 1957 or later. You may also need a second dose. Pneumococcal conjugate (PCV13) vaccine  You may need this if you have certain conditions and were not previously vaccinated. Pneumococcal polysaccharide (PPSV23) vaccine  You may need one or two doses if you smoke cigarettes or if you have certain conditions. Meningococcal conjugate (MenACWY) vaccine  You may need this if you have certain conditions. Hepatitis A vaccine  You may need this if you have certain conditions or if you travel or work in places where you may be exposed to hepatitis A. Hepatitis B vaccine  You may need this if you have certain conditions or if you travel or work in places where you may be exposed to hepatitis B. Haemophilus influenzae type b (Hib) vaccine  You may need this if you have certain conditions. Human papillomavirus (HPV) vaccine  If recommended by your health care provider, you may need three doses over 6 months. You may receive vaccines as individual doses or as more than one vaccine together in one shot (combination vaccines). Talk with your health care provider about the risks and benefits of combination vaccines. What tests do I need? Blood tests  Lipid and cholesterol  levels. These may be checked every 5 years, or more frequently if you are over 65 years old.  Hepatitis C test.  Hepatitis B test. Screening  Lung cancer screening. You may have this screening every year starting at age 58 if you have a 30-pack-year history of smoking and currently smoke or have quit within the past 15 years.  Colorectal cancer screening. All adults should have this screening starting at age 80 and continuing until age 78. Your health care provider may recommend screening at age 63 if you are at increased risk. You will have tests every 1-10 years, depending on your results and the type of screening test.  Diabetes screening. This is done by checking your blood sugar (glucose) after you have not eaten for a while (fasting). You may have this done every 1-3 years.  Mammogram. This may be done every 1-2 years. Talk with your health care provider about when you should start having regular mammograms. This may depend on whether you have a family  history of breast cancer.  BRCA-related cancer screening. This may be done if you have a family history of breast, ovarian, tubal, or peritoneal cancers.  Pelvic exam and Pap test. This may be done every 3 years starting at age 21. Starting at age 32, this may be done every 5 years if you have a Pap test in combination with an HPV test. Other tests  Sexually transmitted disease (STD) testing.  Bone density scan. This is done to screen for osteoporosis. You may have this scan if you are at high risk for osteoporosis. Follow these instructions at home: Eating and drinking  Eat a diet that includes fresh fruits and vegetables, whole grains, lean protein, and low-fat dairy.  Take vitamin and mineral supplements as recommended by your health care provider.  Do not drink alcohol if: ? Your health care provider tells you not to drink. ? You are pregnant, may be pregnant, or are planning to become pregnant.  If you drink alcohol: ?  Limit how much you have to 0-1 drink a day. ? Be aware of how much alcohol is in your drink. In the [place] one drink equals one 12 oz bottle of beer (355 mL), one 5 oz glass of wine (148 mL), or one 1 oz glass of hard liquor (44 mL). Lifestyle  Take daily care of your teeth and gums.  Stay active. Exercise for at least 30 minutes on 5 or more days each week.  Do not use any products that contain nicotine or tobacco, such as cigarettes, e-cigarettes, and chewing tobacco. If you need help quitting, ask your health care provider.  If you are sexually active, practice safe sex. Use a condom or other form of birth control (contraception) in order to prevent pregnancy and STIs (sexually transmitted infections).  If told by your health care provider, take low-dose aspirin daily starting at age 10. What's next?  Visit your health care provider once a year for a well check visit.  Ask your health care provider how often you should have your eyes and teeth checked.  Stay up to date on all vaccines. This information is not intended to replace advice given to you by your health care provider. Make sure you discuss any questions you have with your health care provider. Document Released: 01/01/2016 Document Revised: 08/16/2018 Document Reviewed: 08/16/2018 Elsevier Patient Education  2020 [employer].

## 2019-11-12 NOTE — Progress Notes (Signed)
Patient: Joy Ray, Female    DOB: May 30, 1966, 53 y.o.   MRN: [id number] Delsa Grana, PA-C Visit Date: 11/12/2019  Today's Provider: Delsa Grana, PA-C   Chief Complaint  Patient presents with  . Annual Exam   Subjective:   Annual physical exam:  Joy Ray is a 53 y.o. female who presents today for complete physical exam:  Exercise/Activity:  Works most days of the week, 7 days  Diet/nutrition:   Eats what she wants, including bacon daily  Sleep: fairly well, decreased hours of sleep, about 4 hours of sleep a night, been like that for years - she rests longer than that at night, but only sleeping 4 hours a night  Pt wished to discuss acute complaints - no, she does not want to address BP today, she denies any sx.   do routine f/up on chronic conditions today in addition to CPE. Advised pt of separate visit billing/coding  USPSTF grade A and B recommendations - reviewed and addressed today  Depression:  Phq 9 completed today by patient, was reviewed by me with patient in the room, score is  negative, pt feels overall good, but is a little stressed with COVID 19 and the world, job changes PHQ 2/9 Scores 11/12/2019 08/30/2018 04/18/2018 04/10/2017  PHQ - 2 Score 0 0 0 0  PHQ- 9 Score 1 - - -   Depression screen [facility] 2/9 11/12/2019 08/30/2018 04/18/2018 04/10/2017 04/05/2016  Decreased Interest 0 0 0 0 0  Down, Depressed, Hopeless 0 0 0 0 0  PHQ - 2 Score 0 0 0 0 0  Altered sleeping 1 - - - -  Tired, decreased energy 0 - - - -  Change in appetite 0 - - - -  Feeling bad or failure about yourself  0 - - - -  Trouble concentrating 0 - - - -  Moving slowly or fidgety/restless 0 - - - -  Suicidal thoughts 0 - - - -  PHQ-9 Score 1 - - - -  Difficult doing work/chores Not difficult at all - - - -    Alcohol screening:   Office Visit from 11/12/2019 in [facility]  AUDIT-C Score  1      Immunizations and Health Maintenance: Health  Maintenance  Topic Date Due  . MAMMOGRAM  05/17/2019  . HIV Screening  11/11/2020 (Originally 02[birth year]24/1983)  . PAP SMEAR-Modifier  04/11/2020  . COLONOSCOPY  04/21/2021  . TETANUS/TDAP  12/20/2023  . INFLUENZA VACCINE  Completed     Hep C Screening: n/a  STD testing and prevention (HIV/chl/gon/syphilis): none  Intimate partner violence:  Denies, feels safe at home  Sexual History/Pain during Intercourse: Single  Menstrual History/LMP/Abnormal Bleeding:   None - on depo shot, no periods for 25 years Sees OBGYN  Incontinence Symptoms: none  Breast cancer:  due Last Mammogram: 05/2018 BRCA gene screening: pt unsure of results  Cervical cancer screening: done today Family hx of cancers - breast, ovarian, uterine, colon:   Mom has hx of breast CA - pt did testing  Osteoporosis:   Discussed high calcium and vitamin D supplementation, weight bearing exercises  Skin cancer:  Hx of skin CA -  NO Discussed atypical lesions   Colorectal cancer:   colonoscopy is done in 2017 Dr Allen Norris, 5 vs 10 year f/up -    Lung cancer:   Low Dose CT Chest recommended if Age 57-80 years, 30 pack-year currently smoking OR have quit w/in 15years.  Patient does not qualify.     Occasional smoker, cigarettes less than pack a week   Social History   Tobacco Use  . Smoking status: Light Tobacco Smoker    Packs/day: 0.50    Years: 20.00    Pack years: 10.00    Types: Cigarettes    Last attempt to quit: 04/21/2016    Years since quitting: 3.5  . Smokeless tobacco: Never Used  . Tobacco comment: pt has cut back to 1 or 2 cigs/day  Substance Use Topics  . Alcohol use: Yes    Alcohol/week: 1.0 standard drinks    Types: 1 Glasses of wine per week    Comment:       ECG:  See below  Blood pressure/Hypertension: BP Readings from Last 3 Encounters:  11/12/19 (!) 200/120  08/30/18 130/74  04/18/18 110/68    Weight/Obesity: Wt Readings from Last 3 Encounters:  11/12/19 130 lb 9.6 oz (59.2 kg)   08/30/18 128 lb (58.1 kg)  04/18/18 129 lb 4.8 oz (58.7 kg)   BMI Readings from Last 3 Encounters:  11/12/19 19.86 kg/m  08/30/18 19.46 kg/m  04/18/18 19.66 kg/m     Lipids:  Lab Results  Component Value Date   CHOL 165 05/21/2018   CHOL 180 04/10/2017   CHOL 174 04/05/2016   Lab Results  Component Value Date   HDL 55 05/21/2018   HDL 63 04/10/2017   HDL 64 04/05/2016   Lab Results  Component Value Date   LDLCALC 91 05/21/2018   LDLCALC 93 04/10/2017   LDLCALC 85 04/05/2016   Lab Results  Component Value Date   TRIG 93 05/21/2018   TRIG 122 04/10/2017   TRIG 127 04/05/2016   Lab Results  Component Value Date   CHOLHDL 2.9 04/10/2017   No results found for: LDLDIRECT Based on the results of lipid panel his/her cardiovascular risk factor ( using [place] )  in the next 10 years is: The 10-year ASCVD risk score Mikey Bussing DC Jr., et al., 2013) is: 18.5%   Values used to calculate the score:     Age: 32 years     Sex: Female     Is Non-Hispanic African American: Yes     Diabetic: No     Tobacco smoker: Yes     Systolic Blood Pressure: 774 mmHg     Is BP treated: No     HDL Cholesterol: 55 mg/dL     Total Cholesterol: 165 mg/dL  Glucose:  Glucose  Date Value Ref Range Status  08/30/2018 112 (H) 65 - 99 mg/dL Final  05/21/2018 85 65 - 99 mg/dL Final  04/10/2017 89 65 - 99 mg/dL Final      [facility] Visit from 11/12/2019 in [facility]  AUDIT-C Score  1     Depression: Phq 9 is  negative Depression screen [facility] 2/9 11/12/2019 08/30/2018 04/18/2018 04/10/2017 04/05/2016  Decreased Interest 0 0 0 0 0  Down, Depressed, Hopeless 0 0 0 0 0  PHQ - 2 Score 0 0 0 0 0  Altered sleeping 1 - - - -  Tired, decreased energy 0 - - - -  Change in appetite 0 - - - -  Feeling bad or failure about yourself  0 - - - -  Trouble concentrating 0 - - - -  Moving slowly or fidgety/restless 0 - - - -  Suicidal thoughts 0 - - - -  PHQ-9 Score 1 - - - -  Difficult doing work/chores Not difficult at all - - - -   Hypertension: BP Readings from Last 3 Encounters:  11/12/19 (!) 200/120  08/30/18 130/74  04/18/18 110/68   Obesity: Wt Readings from Last 3 Encounters:  11/12/19 130 lb 9.6 oz (59.2 kg)  08/30/18 128 lb (58.1 kg)  04/18/18 129 lb 4.8 oz (58.7 kg)   BMI Readings from Last 3 Encounters:  11/12/19 19.86 kg/m  08/30/18 19.46 kg/m  04/18/18 19.66 kg/m    Social History      She  reports that she has been smoking cigarettes. She has a 10.00 pack-year smoking history. She has never used smokeless tobacco. She reports current alcohol use of about 1.0 standard drinks of alcohol per week. She reports that she does not use drugs.       Social History   Socioeconomic History  . Marital status: Single    Spouse name: Not on file  . Number of children: 1  . Years of education: Not on file  . Highest education level: Not on file  Occupational History  . Not on file  Social Needs  . Financial resource strain: Not hard at all  . Food insecurity    Worry: Never true    Inability: Never true  . Transportation needs    Medical: No    Non-medical: No  Tobacco Use  . Smoking status: Light Tobacco Smoker    Packs/day: 0.50    Years: 20.00    Pack years: 10.00    Types: Cigarettes    Last attempt to quit: 04/21/2016    Years since quitting: 3.5  . Smokeless tobacco: Never Used  . Tobacco comment: pt has cut back to 1 or 2 cigs/day  Substance and Sexual Activity  . Alcohol use: Yes    Alcohol/week: 1.0 standard drinks    Types: 1 Glasses of wine per week    Comment:    . Drug use: No  . Sexual activity: Not Currently  Lifestyle  . Physical activity    Days per week: 0 days    Minutes per session: 0 min  . Stress: Not at all  Relationships  . Social connections    Talks on phone: More than three times a week    Gets together: More than three times a week    Attends religious service: More than 4 times per year     Active member of club or organization: Not on file    Attends meetings of clubs or organizations: Never    Relationship status: Never married  Other Topics Concern  . Not on file  Social History Narrative  . Not on file    Family History        Family Status  Relation Name Status  . Mother  Alive  . [employer]  . Father  Deceased       natural causes  . Sister 1 Alive  . Brother Film/video editor  . Son 1 Alive  . MGM  Deceased       old age  . MGF  Deceased       old age  . PGM  Deceased       old age  . PGF  Deceased       old age        Her family history includes Breast cancer in her maternal aunt; Breast cancer (age of onset: 25) in her mother; Cancer in her mother.  Family History  Problem Relation Age of Onset  . Cancer Mother        breast  . Breast cancer Mother 56  . Breast cancer Maternal Aunt     Patient Active Problem List   Diagnosis Date Noted  . History of smoking 10-25 pack years 04/18/2018  . Preventative health care 04/30/2016  . Special screening for malignant neoplasms, colon   . Second degree hemorrhoids   . Colon cancer screening 04/05/2016  . Screening for cervical cancer 04/05/2016  . Annual physical exam 02/02/2016  . Need for immunization against influenza 02/02/2016  . Allergic rhinitis 10/13/2015  . H/O abnormal cervical Papanicolaou smear 10/13/2015  . Hypertension goal BP (blood pressure) < 140/90 10/13/2015  . Encounter for surveillance of injectable contraceptive 10/13/2015    Past Surgical History:  Procedure Laterality Date  . COLONOSCOPY WITH PROPOFOL N/A 04/21/2016   Procedure: COLONOSCOPY WITH PROPOFOL;  Surgeon: Lucilla Lame, MD;  Location: [place];  Service: Endoscopy;  Laterality: N/A;  . TUBAL LIGATION  1994   After G1P1001     Current Outpatient Medications:  .  fluticasone (FLONASE) 50 MCG/ACT nasal spray, SHAKE LIQUID AND USE 2 SPRAYS IN EACH NOSTRIL EVERY DAY, Disp: 16 g, Rfl: 3 .   medroxyPROGESTERone (DEPO-PROVERA) 150 MG/ML injection, ADMINISTER 1 ML IN THE MUSCLE EVERY 13 WEEKS AS DIRECTED, Disp: 1 mL, Rfl: 3  Allergies  Allergen Reactions  . Ciprofloxacin     Pt denies    Patient Care Team: Delsa Grana, PA-C as PCP - General (Family Medicine)  Review of Systems  Constitutional: Negative.  Negative for activity change, appetite change, fatigue and unexpected weight change.  HENT: Negative.   Eyes: Negative.  Negative for visual disturbance.  Respiratory: Negative.  Negative for apnea, cough, choking, chest tightness and shortness of breath.   Cardiovascular: Negative.  Negative for chest pain, palpitations and leg swelling.  Gastrointestinal: Negative.  Negative for abdominal pain, blood in stool, constipation, diarrhea, nausea and vomiting.  Endocrine: Negative.   Genitourinary: Negative.  Negative for vaginal discharge and vaginal pain.  Musculoskeletal: Negative.  Negative for arthralgias, gait problem, joint swelling and myalgias.  Skin: Negative.  Negative for color change, pallor and rash.  Allergic/Immunologic: Negative.   Neurological: Negative.  Negative for dizziness, tremors, syncope, facial asymmetry, speech difficulty, weakness, light-headedness, numbness and headaches.  Hematological: Negative.   Psychiatric/Behavioral: Negative.  Negative for confusion, dysphoric mood, self-injury and suicidal ideas. The patient is not nervous/anxious.     I have reviewed the patient's medical history in detail and updated the computerized patient record.        Objective:   Vitals:  Vitals:   11/12/19 0816 11/12/19 0824  BP: (!) 180/110 (!) 200/120  Pulse: 89   Temp: (!) 96.9 F (36.1 C)   SpO2: 100%   Weight: 130 lb 9.6 oz (59.2 kg)     Body mass index is 19.86 kg/m.  Physical Exam Vitals signs and nursing note reviewed. Exam conducted with a chaperone present.  Constitutional:      General: She is not in acute distress.    Appearance:  Normal appearance. She is well-developed and normal weight. She is not ill-appearing, toxic-appearing or diaphoretic.  HENT:     Head: Normocephalic and atraumatic.     Mouth/Throat:     Pharynx: Uvula midline.  Eyes:     General: Lids are normal. No scleral icterus.       Right eye: No discharge.  Left eye: No discharge.     Conjunctiva/sclera: Conjunctivae normal.     Pupils: Pupils are equal, round, and reactive to light.  Neck:     Musculoskeletal: Normal range of motion and neck supple.     Trachea: Phonation normal. No tracheal deviation.  Cardiovascular:     Rate and Rhythm: Normal rate and regular rhythm.     Pulses: Normal pulses.          Radial pulses are 2+ on the right side and 2+ on the left side.       Posterior tibial pulses are 2+ on the right side and 2+ on the left side.     Heart sounds: Normal heart sounds. No murmur. No friction rub. No gallop.   Pulmonary:     Effort: Pulmonary effort is normal. No respiratory distress.     Breath sounds: Normal breath sounds. No stridor. No wheezing, rhonchi or rales.  Chest:     Chest wall: No tenderness.     Comments: Patient refused breast exam today Abdominal:     General: Bowel sounds are normal. There is no distension.     Palpations: Abdomen is soft.     Tenderness: There is no abdominal tenderness. There is no guarding or rebound.  Genitourinary:    Pubic Area: No rash or pubic lice.      Urethra: No prolapse.     Vagina: No signs of injury and foreign body. Vaginal discharge present. No erythema, tenderness, bleeding, lesions or prolapsed vaginal walls.     Cervix: Normal. No eversion.     Uterus: Normal.      Adnexa: Right adnexa normal and left adnexa normal.     Comments: Moderate white cottage cheese like vaginal discharge in vaginal vault Musculoskeletal: Normal range of motion.        General: No deformity.     Right lower leg: No edema.     Left lower leg: No edema.  Lymphadenopathy:      Cervical: No cervical adenopathy.  Skin:    General: Skin is warm and dry.     Capillary Refill: Capillary refill takes less than 2 seconds.     Coloration: Skin is not jaundiced or pale.     Findings: No bruising or rash.  Neurological:     Mental Status: She is alert.     Sensory: No sensory deficit.     Motor: No weakness or abnormal muscle tone.     Coordination: Coordination normal.     Gait: Gait normal.  Psychiatric:        Mood and Affect: Mood normal.        Speech: Speech normal.        Behavior: Behavior normal.     ECG interpretation   Date: 11/12/19  Rate: 84  Rhythm: sinus rhythm  QRS Axis: normal axis  Intervals: normal  ST/T Wave abnormalities: lead III and aVF TWI, LVH, no ST elevation or depression, no reciprocal changes  Conduction Disutrbances: none  Narrative Interpretation: "Sinus rhythm inferior T wave changes are nonspecific, borderline ECG"  Old EKG Reviewed:   No old ECG to compare to in EMR    Fall Risk: Fall Risk  11/12/2019 08/30/2018 04/18/2018 04/10/2017 04/05/2016  Falls in the past year? 0 No No No No  Number falls in past yr: 0 - - - -  Injury with Fall? 0 - - - -    Functional Status Survey: Is the patient deaf or have  difficulty hearing?: No Does the patient have difficulty seeing, even when wearing glasses/contacts?: No Does the patient have difficulty concentrating, remembering, or making decisions?: No Does the patient have difficulty walking or climbing stairs?: No Does the patient have difficulty dressing or bathing?: No Does the patient have difficulty doing errands alone such as visiting a doctor's office or shopping?: No   Assessment & Plan:    CPE completed today  . USPSTF grade A and B recommendations reviewed with patient; age-appropriate recommendations, preventive care, screening tests, etc discussed and encouraged; healthy living encouraged; see AVS for patient education given to patient  . Discussed importance of 150  minutes of physical activity weekly, AHA exercise recommendations given to pt in AVS/handout  . Discussed importance of healthy diet:  eating lean meats and proteins, avoiding trans fats and saturated fats, avoid simple sugars and excessive carbs in diet, eat 6 servings of fruit/vegetables daily and drink plenty of water and avoid sweet beverages.    . Recommended pt to do annual eye exam and routine dental exams/cleanings  . Depression, alcohol, fall screening completed as documented above and per flowsheets  . Reviewed Health Maintenance: Health Maintenance  Topic Date Due  . MAMMOGRAM  05/17/2019  . HIV Screening  11/11/2020 (Originally 02[birth year]24/1983)  . PAP SMEAR-Modifier  04/11/2020  . COLONOSCOPY  04/21/2021  . TETANUS/TDAP  12/20/2023  . INFLUENZA VACCINE  Completed    . Immunizations: Immunization History  Administered Date(s) Administered  . Influenza,inj,Quad PF,6+ Mos 02/01/2016        Adult general medical exam Done today - CBC w/ Diff - Lipid Panel - CMET - [facility]  Screening for lipoid disorders Discussed ASCVD 10-year risk calculator showed her modifiable factors including her blood pressure, smoking and explained the risk of heart attack and stroke, dietary and lifestyle changes to improve and decrease her risk, also explained the benefits of statin medication - Lipid Panel - CMET - [facility]  Screening for endocrine, metabolic and immunity disorder - CBC w/ Diff - Lipid Panel - CMET - [facility]  Screening for malignant neoplasm of cervix - PAP with HPV   Encounter for screening mammogram for malignant neoplasm of breast - MM 3D SCREEN BREAST BILATERAL; Future  Asymptomatic hypertensive urgency Patient denies any chest pain, chest pressure, shortness of breath, back pain, headaches, visual disturbances, slurred speech, confusion, focal numbness or weakness, palpitations, lower extremity edema, orthopnea, PND, near syncope.  She is adamant that she has no  symptoms and that her blood pressure is actually not that high she is probably just stressed.  Did review concerning signs and symptoms of hypertensive urgency, reviewed possible endorgan damage and risk of heart attack stroke, kidney damage.  I advised starting blood pressure medication and returning to monitor the patient declines and would rather return and have her blood pressure rechecked prior to starting any medications.  I explained that even with stress or significant illness its unlikely to cause a blood pressure elevation temporarily this high and she likely needs her blood pressure treated to bring it down into safe range.  Patient agreed to EKG with continued elevated blood pressure reading here today but she continued to decline any treatment or medications.  She was given handouts on hypertensive urgency, with the ER precautions in multiple places reviewed in the room with her and given to her at time of leaving clinic.   - EKG 12-Lead - CMET - [facility]  Current Smoker -reported as light cigarette smoker 1-2 a day, with  roughly 10-15 pack year hx, she denies any SOB/exertional dyspnea, chronic cough, wheeze   Encounter for surveillance of injectable contraceptive Done by staff prior to seeing pt - medroxyPROGESTERone Acetate SUSY 150 mg    Pt a [facility] employee  ECG suggestive of left ventricular hypertrophy, discussed longstanding hypertension and onset of LVH/cardiomegaly/heart failure and/or arrhythmias also increased risk of stroke heart attack endorgan damage, patient would like to get a blood pressure cuff and monitor at home when she is relaxed she does think that she has some whitecoat hypertension and that her blood pressure at home is normal and not concerning.  She again refused to allow me to start medications on her.  But she will do a close follow-up in the next 1 to 2 weeks. She verbalized understanding of ER precautions and red flags which warrant immediate evaluation.   Delsa Grana, PA-C 11/12/19 9:32 AM  [facility]

## 2019-11-21 LAB — PAP IG AND HPV HIGH-RISK: HPV, high-risk: NEGATIVE

## 2019-11-26 ENCOUNTER — Ambulatory Visit: Payer: 59

## 2019-11-26 ENCOUNTER — Other Ambulatory Visit: Payer: Self-pay

## 2019-11-26 VITALS — BP 152/94 | HR 86

## 2019-11-26 DIAGNOSIS — I160 Hypertensive urgency: Secondary | ICD-10-CM

## 2019-11-26 NOTE — Progress Notes (Signed)
Yes patient needs to get a visit dedicated to her blood pressure

## 2019-11-27 NOTE — Progress Notes (Signed)
lvm for scheduling °

## 2020-01-28 ENCOUNTER — Ambulatory Visit (INDEPENDENT_AMBULATORY_CARE_PROVIDER_SITE_OTHER): Payer: 59

## 2020-01-28 ENCOUNTER — Other Ambulatory Visit: Payer: Self-pay

## 2020-01-28 DIAGNOSIS — Z3042 Encounter for surveillance of injectable contraceptive: Secondary | ICD-10-CM

## 2020-01-28 MED ORDER — MEDROXYPROGESTERONE ACETATE 150 MG/ML IM SUSP: 150.0000 mg | Freq: Once | INTRAMUSCULAR | Status: DC

## 2020-01-28 MED ORDER — MEDROXYPROGESTERONE ACETATE 150 MG/ML IM SUSY
150.0000 mg | PREFILLED_SYRINGE | INTRAMUSCULAR | Status: AC
  Administered 2020-01-28: 150 mg via INTRAMUSCULAR

## 2020-04-27 ENCOUNTER — Ambulatory Visit (INDEPENDENT_AMBULATORY_CARE_PROVIDER_SITE_OTHER): Payer: 59

## 2020-04-27 ENCOUNTER — Other Ambulatory Visit: Payer: Self-pay

## 2020-04-27 DIAGNOSIS — Z3042 Encounter for surveillance of injectable contraceptive: Secondary | ICD-10-CM

## 2020-04-27 MED ORDER — MEDROXYPROGESTERONE ACETATE 150 MG/ML IM SUSP
150.0000 mg | Freq: Once | INTRAMUSCULAR | Status: AC
  Administered 2020-04-27: 150 mg via INTRAMUSCULAR

## 2020-07-27 ENCOUNTER — Other Ambulatory Visit: Payer: Self-pay

## 2020-07-27 ENCOUNTER — Ambulatory Visit (INDEPENDENT_AMBULATORY_CARE_PROVIDER_SITE_OTHER): Payer: 59

## 2020-07-27 DIAGNOSIS — Z3042 Encounter for surveillance of injectable contraceptive: Secondary | ICD-10-CM | POA: Diagnosis not present

## 2020-07-27 MED ORDER — MEDROXYPROGESTERONE ACETATE 150 MG/ML IM SUSP
150.0000 mg | Freq: Once | INTRAMUSCULAR | Status: AC
  Administered 2020-07-27: 150 mg via INTRAMUSCULAR

## 2020-10-20 ENCOUNTER — Other Ambulatory Visit: Payer: Self-pay | Admitting: Family Medicine

## 2020-10-20 MED ORDER — MEDROXYPROGESTERONE ACETATE 150 MG/ML IM SUSP: INTRAMUSCULAR | 0 refills | Status: DC

## 2020-10-20 NOTE — Telephone Encounter (Signed)
Pt called in for assistance. Pt says that she is schedule d to have her depo on Friday, pt says that she hasn't received her Rx at the pharmacy. Pt was told by pharmacy that it has been requested, not showing in chart.    medroxyPROGESTERone (DEPO-PROVERA) 150 MG/ML injection    Pharmacy:  [facility] [place], [place] [facility] Phone:  [phone]  Fax:  [phone]

## 2020-10-20 NOTE — Telephone Encounter (Signed)
Requested medication (s) are due for refill today:  Yes  Requested medication (s) are on the active medication list:  Yes  Future visit scheduled:  No  Last Refill: 11/08/19; 1 ml./ RF x 3   Notes to clinic: Medication is off protocol.  Please advise.  Requested Prescriptions  Pending Prescriptions Disp Refills   medroxyPROGESTERone (DEPO-PROVERA) 150 MG/ML injection 1 mL 3    Sig: ADMINISTER 1 ML IN THE MUSCLE EVERY 13 WEEKS AS DIRECTED      Off-Protocol Failed - 10/20/2020 12:23 PM      Failed - Medication not assigned to a protocol, review manually.      Passed - Valid encounter within last 12 months    Recent Outpatient Visits           11 months ago Adult general medical exam   [facility] Delsa Grana, PA-C   2 years ago Encounter for surveillance of injectable contraceptive   [facility] [place], Satira Anis, MD   2 years ago Routine general medical examination at a health care facility   [place], NP   3 years ago Preventative health care   [facility] Lada, Satira Anis, MD   4 years ago Preventative health care   [facility] Lada, Satira Anis, MD

## 2020-10-23 ENCOUNTER — Ambulatory Visit (INDEPENDENT_AMBULATORY_CARE_PROVIDER_SITE_OTHER): Payer: 59 | Admitting: Emergency Medicine

## 2020-10-23 ENCOUNTER — Other Ambulatory Visit: Payer: Self-pay

## 2020-10-23 DIAGNOSIS — Z3042 Encounter for surveillance of injectable contraceptive: Secondary | ICD-10-CM

## 2020-10-23 MED ORDER — MEDROXYPROGESTERONE ACETATE 150 MG/ML IM SUSP
150.0000 mg | Freq: Once | INTRAMUSCULAR | Status: AC
  Administered 2020-10-23: 150 mg via INTRAMUSCULAR

## 2020-10-23 NOTE — Progress Notes (Signed)
Patient came in for her depo injection.

## 2021-01-01 ENCOUNTER — Other Ambulatory Visit: Payer: Self-pay

## 2021-01-01 NOTE — Telephone Encounter (Signed)
Copied from [place] [phone]. Topic: Quick Communication - Rx Refill/Question >> Jan 01, 2021 11:07 AM Gillis Ends D wrote: Medication: Depo Provera patient's Appt on 01-08-2021  Has the patient contacted their pharmacy? Yes.   (Agent: If no, request that the patient contact the pharmacy for the refill.) (Agent: If yes, when and what did the pharmacy advise?)  Preferred Pharmacy (with phone number or street name): [id number] [address], [place] [facility]  Agent: Please be advised that RX refills may take up to 3 business days. We ask that you follow-up with your pharmacy.

## 2021-01-05 NOTE — Telephone Encounter (Signed)
Requested medication (s) are due for refill today: Yes  Requested medication (s) are on the active medication list: Yes  Last refill:  10/20/20  Future visit scheduled: Yes  Notes to clinic:  Unable to refill per protocol, medication not assigned to protocol.      Requested Prescriptions  Pending Prescriptions Disp Refills   medroxyPROGESTERone (DEPO-PROVERA) 150 MG/ML injection 1 mL 0    Sig: ADMINISTER 1 ML IN THE MUSCLE EVERY 13 WEEKS AS DIRECTED      Off-Protocol Failed - 01/05/2021 12:29 PM      Failed - Medication not assigned to a protocol, review manually.      Failed - Valid encounter within last 12 months    Recent Outpatient Visits           1 year ago Adult general medical exam   [facility] Delsa Grana, PA-C   2 years ago Encounter for surveillance of injectable contraceptive   [facility] Lada, Satira Anis, MD   2 years ago Routine general medical examination at a health care facility   [facility] Fredderick Severance, NP   3 years ago Preventative health care   [place], Satira Anis, MD   4 years ago Preventative health care   [place], Satira Anis, MD       Future Appointments             In 3 days Delsa Grana, PA-C [facility], [facility]

## 2021-01-05 NOTE — Telephone Encounter (Signed)
Medication Refill - Medication: medroxyPROGESTERone (DEPO-PROVERA) 150 MG/ML injection  Pt needs this refill before her appt on Friday  Has the patient contacted their pharmacy? Yes.   (Agent: If no, request that the patient contact the pharmacy for the refill.) (Agent: If yes, when and what did the pharmacy advise?)pharmacy made request with no response / call pcp   Preferred Pharmacy (with phone number or street name): [id number] [address], [place] [facility] Phone:  [phone]  Fax:  [phone]       Agent: Please be advised that RX refills may take up to 3 business days. We ask that you follow-up with your pharmacy.

## 2021-01-08 ENCOUNTER — Ambulatory Visit: Payer: 59 | Admitting: Family Medicine

## 2021-01-08 ENCOUNTER — Encounter: Payer: 59 | Admitting: Family Medicine

## 2021-01-20 ENCOUNTER — Telehealth: Payer: Self-pay | Admitting: Family Medicine

## 2021-01-20 MED ORDER — MEDROXYPROGESTERONE ACETATE 150 MG/ML IM SUSP: INTRAMUSCULAR | 0 refills | Status: DC

## 2021-01-20 NOTE — Telephone Encounter (Signed)
Pt stated no one has sent her Rx for Depo to the pharmacy and her appt to come in to get the shot is tomorrow/ please advise asap

## 2021-01-20 NOTE — Telephone Encounter (Signed)
Order sent to [facility]

## 2021-01-21 ENCOUNTER — Other Ambulatory Visit: Payer: Self-pay

## 2021-01-21 ENCOUNTER — Ambulatory Visit (INDEPENDENT_AMBULATORY_CARE_PROVIDER_SITE_OTHER): Payer: 59 | Admitting: Emergency Medicine

## 2021-01-21 DIAGNOSIS — Z3009 Encounter for other general counseling and advice on contraception: Secondary | ICD-10-CM | POA: Diagnosis not present

## 2021-01-21 MED ORDER — MEDROXYPROGESTERONE ACETATE 150 MG/ML IM SUSP
150.0000 mg | Freq: Once | INTRAMUSCULAR | Status: AC
  Administered 2021-01-21: 150 mg via INTRAMUSCULAR

## 2021-01-21 NOTE — Telephone Encounter (Signed)
Patient coming in today for injection

## 2021-02-11 ENCOUNTER — Encounter: Payer: 59 | Admitting: Family Medicine

## 2021-03-09 ENCOUNTER — Encounter: Payer: 59 | Admitting: Family Medicine

## 2021-03-09 DIAGNOSIS — Z0000 Encounter for general adult medical examination without abnormal findings: Secondary | ICD-10-CM

## 2021-03-09 DIAGNOSIS — Z114 Encounter for screening for human immunodeficiency virus [HIV]: Secondary | ICD-10-CM

## 2021-03-09 DIAGNOSIS — Z13220 Encounter for screening for lipoid disorders: Secondary | ICD-10-CM

## 2021-03-09 DIAGNOSIS — Z1159 Encounter for screening for other viral diseases: Secondary | ICD-10-CM

## 2021-03-09 DIAGNOSIS — Z130 Encounter for screening for diseases of the blood and blood-forming organs and certain disorders involving the immune mechanism: Secondary | ICD-10-CM

## 2021-03-24 ENCOUNTER — Other Ambulatory Visit: Payer: Self-pay

## 2021-03-24 ENCOUNTER — Encounter: Payer: Self-pay | Admitting: Physician Assistant

## 2021-03-24 ENCOUNTER — Ambulatory Visit (INDEPENDENT_AMBULATORY_CARE_PROVIDER_SITE_OTHER): Payer: 59 | Admitting: Physician Assistant

## 2021-03-24 VITALS — BP 126/84 | HR 92 | Temp 97.9°F | Resp 16 | Ht 68.0 in | Wt 126.0 lb

## 2021-03-24 DIAGNOSIS — Z3042 Encounter for surveillance of injectable contraceptive: Secondary | ICD-10-CM | POA: Diagnosis not present

## 2021-03-24 DIAGNOSIS — Z1231 Encounter for screening mammogram for malignant neoplasm of breast: Secondary | ICD-10-CM | POA: Diagnosis not present

## 2021-03-24 DIAGNOSIS — Z0000 Encounter for general adult medical examination without abnormal findings: Secondary | ICD-10-CM | POA: Diagnosis not present

## 2021-03-24 DIAGNOSIS — Z23 Encounter for immunization: Secondary | ICD-10-CM | POA: Diagnosis not present

## 2021-03-24 NOTE — Progress Notes (Signed)
Complete physical exam   Patient: Joy Ray   DOB: 09-22-1966   55 y.o. Female  MRN: [id number] Visit Date: 03/24/2021  Today's healthcare provider: Trinna Post, PA-C   Chief Complaint  Patient presents with  . Annual Exam   Subjective    Joy Ray is a 55 y.o. female who presents today for a complete physical exam.  She reports consuming a general diet. Walking for exercise.  She generally feels well. She reports sleeping well. She does have additional problems to discuss today.  HPI   Patient has been taking depo provera for 28 years. She has history of tubal ligation but was extremely concerned about pregnancy and so had also been taking birth control. Continues to take this. Reports she has been feeling some hot flashes and night sweats recently.     Past Medical History:  Diagnosis Date  . Allergy   . Cigarette smoker   . HTN, goal below 140/90   . Second degree hemorrhoids    Past Surgical History:  Procedure Laterality Date  . COLONOSCOPY WITH PROPOFOL N/A 04/21/2016   Procedure: COLONOSCOPY WITH PROPOFOL;  Surgeon: Lucilla Lame, MD;  Location: [facility];  Service: Endoscopy;  Laterality: N/A;  . TUBAL LIGATION  1994   After G1P1001   Social History   Socioeconomic History  . Marital status: Single    Spouse name: Not on file  . Number of children: 1  . Years of education: Not on file  . Highest education level: Not on file  Occupational History  . Not on file  Tobacco Use  . Smoking status: Light Tobacco Smoker    Packs/day: 0.50    Years: 20.00    Pack years: 10.00    Types: Cigarettes    Last attempt to quit: 04/21/2016    Years since quitting: 4.9  . Smokeless tobacco: Never Used  . Tobacco comment: pt has cut back to 1 or 2 cigs/day  Vaping Use  . Vaping Use: Never used  Substance and Sexual Activity  . Alcohol use: Yes    Alcohol/week: 1.0 standard drink    Types: 1 Glasses of wine per week    Comment:     . Drug use: No  . Sexual activity: Not Currently  Other Topics Concern  . Not on file  Social History Narrative  . Not on file   Social Determinants of Health   Financial Resource Strain: Low Risk   . Difficulty of Paying Living Expenses: Not hard at all  Food Insecurity: No Food Insecurity  . Worried About Charity fundraiser in the Last Year: Never true  . Ran Out of Food in the Last Year: Never true  Transportation Needs: No Transportation Needs  . Lack of Transportation (Medical): No  . Lack of Transportation (Non-Medical): No  Physical Activity: Insufficiently Active  . Days of Exercise per Week: 3 days  . Minutes of Exercise per Session: 20 min  Stress: No Stress Concern Present  . Feeling of Stress : Only a little  Social Connections: Socially Isolated  . Frequency of Communication with Friends and Family: More than three times a week  . Frequency of Social Gatherings with Friends and Family: More than three times a week  . Attends Religious Services: Never  . Active Member of Clubs or Organizations: No  . Attends Archivist Meetings: Never  . Marital Status: Never married  Intimate Partner Violence: Not At Risk  .  Fear of Current or Ex-Partner: No  . Emotionally Abused: No  . Physically Abused: No  . Sexually Abused: No   Family Status  Relation Name Status  . Mother  Alive  . [employer]  . Father  Deceased       natural causes  . Sister 1 Alive  . Brother Film/video editor  . Son 1 Alive  . MGM  Deceased       old age  . MGF  Deceased       old age  . PGM  Deceased       old age  . PGF  Deceased       old age   Family History  Problem Relation Age of Onset  . Cancer Mother        breast  . Breast cancer Mother 43  . Breast cancer Maternal Aunt    Allergies  Allergen Reactions  . Ciprofloxacin     Pt denies    Patient Care Team: Delsa Grana, PA-C as PCP - General (Family Medicine)   Medications: Outpatient Medications Prior  to Visit  Medication Sig  . fluticasone (FLONASE) 50 MCG/ACT nasal spray SHAKE LIQUID AND USE 2 SPRAYS IN EACH NOSTRIL EVERY DAY  . medroxyPROGESTERone (DEPO-PROVERA) 150 MG/ML injection ADMINISTER 1 ML IN THE MUSCLE EVERY 13 WEEKS AS DIRECTED  . medroxyPROGESTERone Acetate 150 MG/ML SUSY SMARTSIG:1 Milliliter(s) IM Every 3 Weeks   No facility-administered medications prior to visit.    Review of Systems  All other systems reviewed and are negative.     Objective    BP 126/84   Pulse 92   Temp 97.9 F (36.6 C) (Oral)   Resp 16   Ht 5\' 8"  (1.727 m)   Wt 126 lb (57.2 kg)   SpO2 98%   BMI 19.16 kg/m    Physical Exam Constitutional:      Appearance: Normal appearance.  HENT:     Right Ear: Tympanic membrane and ear canal normal.     Left Ear: Tympanic membrane and ear canal normal.  Cardiovascular:     Rate and Rhythm: Normal rate and regular rhythm.     Heart sounds: Normal heart sounds.  Pulmonary:     Effort: Pulmonary effort is normal.     Breath sounds: Normal breath sounds.  Abdominal:     General: Bowel sounds are normal.     Palpations: Abdomen is soft.  Musculoskeletal:     Cervical back: Normal range of motion and neck supple.  Lymphadenopathy:     Cervical: No cervical adenopathy.  Skin:    General: Skin is warm and dry.  Neurological:     Mental Status: She is alert and oriented to person, place, and time. Mental status is at baseline.  Psychiatric:        Mood and Affect: Mood normal.        Behavior: Behavior normal.       Last depression screening scores PHQ 2/9 Scores 03/24/2021 11/12/2019 08/30/2018  PHQ - 2 Score 0 0 0  PHQ- 9 Score - 1 -   Last fall risk screening Fall Risk  03/24/2021  Falls in the past year? 0  Number falls in past yr: 0  Injury with Fall? 0  Follow up Falls evaluation completed   Last Audit-C alcohol use screening Alcohol Use Disorder Test (AUDIT) 03/24/2021  1. How often do you have a drink containing alcohol? 0  2.  How many drinks  containing alcohol do you have on a typical day when you are drinking? 0  3. How often do you have six or more drinks on one occasion? 0  AUDIT-C Score 0   A score of 3 or more in women, and 4 or more in men indicates increased risk for alcohol abuse, EXCEPT if all of the points are from question 1   No results found for any visits on 03/24/21.  Assessment & Plan    Routine Health Maintenance and Physical Exam  Exercise Activities and Dietary recommendations Goals   None     Immunization History  Administered Date(s) Administered  . Influenza,inj,Quad PF,6+ Mos 02/01/2016, 08/03/2019, 08/04/2019  . Janssen (J&J) SARS-COV-2 Vaccination 03/30/2020  . Zoster Recombinat (Shingrix) 03/24/2021    Health Maintenance  Topic Date Due  . Hepatitis C Screening  Never done  . MAMMOGRAM  05/17/2019  . COVID-19 Vaccine (2 - Booster for [employer] series) 05/25/2020  . INFLUENZA VACCINE  07/19/2021  . TETANUS/TDAP  12/20/2023  . PAP SMEAR-Modifier  11/11/2024  . COLONOSCOPY (Pts 45-26yrs Insurance coverage will need to be confirmed)  04/21/2026  . HIV Screening  Completed  . HPV VACCINES  Aged Out    Discussed health benefits of physical activity, and encouraged her to engage in regular exercise appropriate for her age and condition.  1. Annual physical exam  - HIV Antibody (routine testing w rflx) - TSH - Lipid panel - Comprehensive metabolic panel - CBC with Differential/Platelet - Hepatitis C Antibody  2. Encounter for screening mammogram for malignant neoplasm of breast  - MM Digital Screening; Future  3. Need for shingles vaccine  - Varicella-zoster vaccine IM (Shingrix)  4. Encounter for surveillance of injectable contraceptive  Suspect she is likely entering menopause. May consider discontinuing depo shot.    Return in about 1 year (around 03/24/2022).        Trinna Post, PA-C  [name] [phone]  (phone) [phone] (fax)  [place]

## 2021-03-24 NOTE — Patient Instructions (Signed)
Discussed eligibility for Shingrix and to check with insurance company.   Health Maintenance, Female Adopting a healthy lifestyle and getting preventive care are important in promoting health and wellness. Ask your health care provider about:  The right schedule for you to have regular tests and exams.  Things you can do on your own to prevent diseases and keep yourself healthy. What should I know about diet, weight, and exercise? Eat a healthy diet  Eat a diet that includes plenty of vegetables, fruits, low-fat dairy products, and lean protein.  Do not eat a lot of foods that are high in solid fats, added sugars, or sodium.   Maintain a healthy weight Body mass index (BMI) is used to identify weight problems. It estimates body fat based on height and weight. Your health care provider can help determine your BMI and help you achieve or maintain a healthy weight. Get regular exercise Get regular exercise. This is one of the most important things you can do for your health. Most adults should:  Exercise for at least 150 minutes each week. The exercise should increase your heart rate and make you sweat (moderate-intensity exercise).  Do strengthening exercises at least twice a week. This is in addition to the moderate-intensity exercise.  Spend less time sitting. Even light physical activity can be beneficial. Watch cholesterol and blood lipids Have your blood tested for lipids and cholesterol at 53 years of age, then have this test every 5 years. Have your cholesterol levels checked more often if:  Your lipid or cholesterol levels are high.  You are older than 53 years of age.  You are at high risk for heart disease. What should I know about cancer screening? Depending on your health history and family history, you may need to have cancer screening at various ages. This may include screening for:  Breast cancer.  Cervical cancer.  Colorectal cancer.  Skin cancer.  Lung  cancer. What should I know about heart disease, diabetes, and high blood pressure? Blood pressure and heart disease  High blood pressure causes heart disease and increases the risk of stroke. This is more likely to develop in people who have high blood pressure readings, are of African descent, or are overweight.  Have your blood pressure checked: ? Every 3-5 years if you are 18-39 years of age. ? Every year if you are 40 years old or older. Diabetes Have regular diabetes screenings. This checks your fasting blood sugar level. Have the screening done:  Once every three years after age 40 if you are at a normal weight and have a low risk for diabetes.  More often and at a younger age if you are overweight or have a high risk for diabetes. What should I know about preventing infection? Hepatitis B If you have a higher risk for hepatitis B, you should be screened for this virus. Talk with your health care provider to find out if you are at risk for hepatitis B infection. Hepatitis C Testing is recommended for:  Everyone born from 1945 through 1965.  Anyone with known risk factors for hepatitis C. Sexually transmitted infections (STIs)  Get screened for STIs, including gonorrhea and chlamydia, if: ? You are sexually active and are younger than 53 years of age. ? You are older than 53 years of age and your health care provider tells you that you are at risk for this type of infection. ? Your sexual activity has changed since you were last screened, and you are   at increased risk for chlamydia or gonorrhea. Ask your health care provider if you are at risk.  Ask your health care provider about whether you are at high risk for HIV. Your health care provider may recommend a prescription medicine to help prevent HIV infection. If you choose to take medicine to prevent HIV, you should first get tested for HIV. You should then be tested every 3 months for as long as you are taking the  medicine. Pregnancy  If you are about to stop having your period (premenopausal) and you may become pregnant, seek counseling before you get pregnant.  Take 400 to 800 micrograms (mcg) of folic acid every day if you become pregnant.  Ask for birth control (contraception) if you want to prevent pregnancy. Osteoporosis and menopause Osteoporosis is a disease in which the bones lose minerals and strength with aging. This can result in bone fractures. If you are 65 years old or older, or if you are at risk for osteoporosis and fractures, ask your health care provider if you should:  Be screened for bone loss.  Take a calcium or vitamin D supplement to lower your risk of fractures.  Be given hormone replacement therapy (HRT) to treat symptoms of menopause. Follow these instructions at home: Lifestyle  Do not use any products that contain nicotine or tobacco, such as cigarettes, e-cigarettes, and chewing tobacco. If you need help quitting, ask your health care provider.  Do not use street drugs.  Do not share needles.  Ask your health care provider for help if you need support or information about quitting drugs. Alcohol use  Do not drink alcohol if: ? Your health care provider tells you not to drink. ? You are pregnant, may be pregnant, or are planning to become pregnant.  If you drink alcohol: ? Limit how much you use to 0-1 drink a day. ? Limit intake if you are breastfeeding.  Be aware of how much alcohol is in your drink. In the [place] one drink equals one 12 oz bottle of beer (355 mL), one 5 oz glass of wine (148 mL), or one 1 oz glass of hard liquor (44 mL). General instructions  Schedule regular health, dental, and eye exams.  Stay current with your vaccines.  Tell your health care provider if: ? You often feel depressed. ? You have ever been abused or do not feel safe at home. Summary  Adopting a healthy lifestyle and getting preventive care are important in  promoting health and wellness.  Follow your health care provider's instructions about healthy diet, exercising, and getting tested or screened for diseases.  Follow your health care provider's instructions on monitoring your cholesterol and blood pressure. This information is not intended to replace advice given to you by your health care provider. Make sure you discuss any questions you have with your health care provider. Document Revised: 11/28/2018 Document Reviewed: 11/28/2018 Elsevier Patient Education  2021 Elsevier Inc.  

## 2021-04-09 ENCOUNTER — Other Ambulatory Visit: Payer: Self-pay | Admitting: Family Medicine

## 2021-04-09 NOTE — Telephone Encounter (Signed)
Patient  Called in and asking for Rx to be sent to pharmacy today please need it for Monday 04/12/21 pharmacy closed on the weekend. Please advise

## 2021-04-12 ENCOUNTER — Other Ambulatory Visit: Payer: Self-pay

## 2021-04-12 ENCOUNTER — Ambulatory Visit: Payer: 59

## 2021-04-12 NOTE — Telephone Encounter (Signed)
Pt called reporting that she is still waiting for her Rx, she says she has to do this every time and finds it very frustrating.

## 2021-04-13 ENCOUNTER — Ambulatory Visit (INDEPENDENT_AMBULATORY_CARE_PROVIDER_SITE_OTHER): Payer: 59

## 2021-04-13 DIAGNOSIS — Z3042 Encounter for surveillance of injectable contraceptive: Secondary | ICD-10-CM | POA: Diagnosis not present

## 2021-04-13 MED ORDER — MEDROXYPROGESTERONE ACETATE 150 MG/ML IM SUSP
150.0000 mg | Freq: Once | INTRAMUSCULAR | Status: AC
  Administered 2021-04-13: 150 mg via INTRAMUSCULAR

## 2021-05-25 ENCOUNTER — Other Ambulatory Visit: Payer: Self-pay

## 2021-05-25 ENCOUNTER — Ambulatory Visit (INDEPENDENT_AMBULATORY_CARE_PROVIDER_SITE_OTHER): Payer: 59

## 2021-05-25 DIAGNOSIS — Z23 Encounter for immunization: Secondary | ICD-10-CM | POA: Diagnosis not present

## 2021-05-26 ENCOUNTER — Ambulatory Visit: Payer: 59

## 2021-07-06 ENCOUNTER — Other Ambulatory Visit: Payer: Self-pay

## 2021-07-06 ENCOUNTER — Ambulatory Visit (INDEPENDENT_AMBULATORY_CARE_PROVIDER_SITE_OTHER): Payer: 59

## 2021-07-06 DIAGNOSIS — Z1231 Encounter for screening mammogram for malignant neoplasm of breast: Secondary | ICD-10-CM

## 2021-07-06 DIAGNOSIS — Z3042 Encounter for surveillance of injectable contraceptive: Secondary | ICD-10-CM | POA: Diagnosis not present

## 2021-07-06 MED ORDER — MEDROXYPROGESTERONE ACETATE 150 MG/ML IM SUSP
150.0000 mg | Freq: Once | INTRAMUSCULAR | Status: AC
  Administered 2021-07-06: 150 mg via INTRAMUSCULAR

## 2021-07-22 ENCOUNTER — Other Ambulatory Visit: Payer: Self-pay

## 2021-07-22 ENCOUNTER — Ambulatory Visit
Admission: RE | Admit: 2021-07-22 | Discharge: 2021-07-22 | Disposition: A | Discharge status: Home / Self Care | Payer: 59 | Source: Ambulatory Visit | Attending: Family Medicine | Admitting: Family Medicine

## 2021-07-22 DIAGNOSIS — Z1231 Encounter for screening mammogram for malignant neoplasm of breast: Secondary | ICD-10-CM | POA: Insufficient documentation

## 2021-09-30 ENCOUNTER — Other Ambulatory Visit: Payer: Self-pay

## 2021-09-30 MED ORDER — MEDROXYPROGESTERONE ACETATE 150 MG/ML IM SUSP: INTRAMUSCULAR | 1 refills | Status: DC

## 2021-10-04 ENCOUNTER — Ambulatory Visit (INDEPENDENT_AMBULATORY_CARE_PROVIDER_SITE_OTHER): Payer: 59

## 2021-10-04 ENCOUNTER — Other Ambulatory Visit: Payer: Self-pay

## 2021-10-04 ENCOUNTER — Telehealth: Payer: Self-pay

## 2021-10-04 DIAGNOSIS — Z3042 Encounter for surveillance of injectable contraceptive: Secondary | ICD-10-CM | POA: Diagnosis not present

## 2021-10-04 MED ORDER — MEDROXYPROGESTERONE ACETATE 150 MG/ML IM SUSP: INTRAMUSCULAR | 1 refills | Status: DC

## 2021-10-04 MED ORDER — MEDROXYPROGESTERONE ACETATE 150 MG/ML IM SUSP
150.0000 mg | Freq: Once | INTRAMUSCULAR | Status: AC
  Administered 2021-10-04: 150 mg via INTRAMUSCULAR

## 2021-10-04 NOTE — Telephone Encounter (Signed)
Pt went to pick up her depo and the pharmacy does not have it. It look like the fax failed. Will you please resend it, pt has appt this afternoon for the injection.

## 2021-10-26 ENCOUNTER — Inpatient Hospital Stay
Admission: EM | Admit: 2021-10-26 | Discharge: 2021-11-02 | DRG: 673 | Disposition: A | Discharge status: Home / Self Care | Payer: 59 | Attending: Hospitalist | Admitting: Hospitalist

## 2021-10-26 ENCOUNTER — Ambulatory Visit
Admission: RE | Admit: 2021-10-26 | Discharge: 2021-10-26 | Disposition: A | Discharge status: Home / Self Care | Payer: 59 | Source: Ambulatory Visit | Attending: Family Medicine | Admitting: Family Medicine

## 2021-10-26 ENCOUNTER — Ambulatory Visit (INDEPENDENT_AMBULATORY_CARE_PROVIDER_SITE_OTHER): Payer: 59 | Admitting: Family Medicine

## 2021-10-26 ENCOUNTER — Ambulatory Visit: Payer: Self-pay | Admitting: *Deleted

## 2021-10-26 ENCOUNTER — Other Ambulatory Visit: Payer: Self-pay

## 2021-10-26 ENCOUNTER — Emergency Department: Payer: 59

## 2021-10-26 ENCOUNTER — Encounter: Payer: Self-pay | Admitting: Family Medicine

## 2021-10-26 ENCOUNTER — Ambulatory Visit
Admission: RE | Admit: 2021-10-26 | Discharge: 2021-10-26 | Disposition: A | Discharge status: Home / Self Care | Payer: 59 | Source: Home / Self Care | Attending: Family Medicine | Admitting: Family Medicine

## 2021-10-26 VITALS — BP 182/112 | HR 91 | Temp 97.8°F | Resp 16 | Ht 68.0 in | Wt 118.0 lb

## 2021-10-26 DIAGNOSIS — R0689 Other abnormalities of breathing: Secondary | ICD-10-CM

## 2021-10-26 DIAGNOSIS — K432 Incisional hernia without obstruction or gangrene: Secondary | ICD-10-CM | POA: Diagnosis present

## 2021-10-26 DIAGNOSIS — N186 End stage renal disease: Secondary | ICD-10-CM | POA: Diagnosis present

## 2021-10-26 DIAGNOSIS — N179 Acute kidney failure, unspecified: Principal | ICD-10-CM

## 2021-10-26 DIAGNOSIS — Z9851 Tubal ligation status: Secondary | ICD-10-CM | POA: Diagnosis not present

## 2021-10-26 DIAGNOSIS — I169 Hypertensive crisis, unspecified: Secondary | ICD-10-CM | POA: Diagnosis not present

## 2021-10-26 DIAGNOSIS — R0602 Shortness of breath: Secondary | ICD-10-CM

## 2021-10-26 DIAGNOSIS — D6959 Other secondary thrombocytopenia: Secondary | ICD-10-CM | POA: Diagnosis present

## 2021-10-26 DIAGNOSIS — I248 Other forms of acute ischemic heart disease: Secondary | ICD-10-CM | POA: Diagnosis present

## 2021-10-26 DIAGNOSIS — J810 Acute pulmonary edema: Secondary | ICD-10-CM | POA: Diagnosis present

## 2021-10-26 DIAGNOSIS — Z114 Encounter for screening for human immunodeficiency virus [HIV]: Secondary | ICD-10-CM

## 2021-10-26 DIAGNOSIS — E441 Mild protein-calorie malnutrition: Secondary | ICD-10-CM

## 2021-10-26 DIAGNOSIS — Z4902 Encounter for fitting and adjustment of peritoneal dialysis catheter: Secondary | ICD-10-CM | POA: Diagnosis not present

## 2021-10-26 DIAGNOSIS — I252 Old myocardial infarction: Secondary | ICD-10-CM | POA: Diagnosis not present

## 2021-10-26 DIAGNOSIS — F17200 Nicotine dependence, unspecified, uncomplicated: Secondary | ICD-10-CM | POA: Insufficient documentation

## 2021-10-26 DIAGNOSIS — Z13220 Encounter for screening for lipoid disorders: Secondary | ICD-10-CM

## 2021-10-26 DIAGNOSIS — K429 Umbilical hernia without obstruction or gangrene: Secondary | ICD-10-CM | POA: Diagnosis present

## 2021-10-26 DIAGNOSIS — I10 Essential (primary) hypertension: Secondary | ICD-10-CM | POA: Diagnosis not present

## 2021-10-26 DIAGNOSIS — Z79899 Other long term (current) drug therapy: Secondary | ICD-10-CM

## 2021-10-26 DIAGNOSIS — D631 Anemia in chronic kidney disease: Secondary | ICD-10-CM | POA: Diagnosis present

## 2021-10-26 DIAGNOSIS — Z681 Body mass index (BMI) 19 or less, adult: Secondary | ICD-10-CM | POA: Diagnosis not present

## 2021-10-26 DIAGNOSIS — E876 Hypokalemia: Secondary | ICD-10-CM | POA: Diagnosis not present

## 2021-10-26 DIAGNOSIS — Z803 Family history of malignant neoplasm of breast: Secondary | ICD-10-CM

## 2021-10-26 DIAGNOSIS — J811 Chronic pulmonary edema: Secondary | ICD-10-CM | POA: Diagnosis present

## 2021-10-26 DIAGNOSIS — I1311 Hypertensive heart and chronic kidney disease without heart failure, with stage 5 chronic kidney disease, or end stage renal disease: Secondary | ICD-10-CM | POA: Diagnosis present

## 2021-10-26 DIAGNOSIS — D696 Thrombocytopenia, unspecified: Secondary | ICD-10-CM | POA: Diagnosis not present

## 2021-10-26 DIAGNOSIS — I161 Hypertensive emergency: Secondary | ICD-10-CM | POA: Diagnosis present

## 2021-10-26 DIAGNOSIS — Z23 Encounter for immunization: Secondary | ICD-10-CM

## 2021-10-26 DIAGNOSIS — F17210 Nicotine dependence, cigarettes, uncomplicated: Secondary | ICD-10-CM | POA: Diagnosis present

## 2021-10-26 DIAGNOSIS — Z992 Dependence on renal dialysis: Secondary | ICD-10-CM | POA: Diagnosis not present

## 2021-10-26 DIAGNOSIS — E538 Deficiency of other specified B group vitamins: Secondary | ICD-10-CM

## 2021-10-26 DIAGNOSIS — Z20822 Contact with and (suspected) exposure to covid-19: Secondary | ICD-10-CM | POA: Diagnosis present

## 2021-10-26 DIAGNOSIS — R1310 Dysphagia, unspecified: Secondary | ICD-10-CM | POA: Diagnosis present

## 2021-10-26 DIAGNOSIS — Z720 Tobacco use: Secondary | ICD-10-CM | POA: Diagnosis not present

## 2021-10-26 DIAGNOSIS — E43 Unspecified severe protein-calorie malnutrition: Secondary | ICD-10-CM | POA: Diagnosis present

## 2021-10-26 DIAGNOSIS — J9601 Acute respiratory failure with hypoxia: Secondary | ICD-10-CM | POA: Diagnosis not present

## 2021-10-26 DIAGNOSIS — N2581 Secondary hyperparathyroidism of renal origin: Secondary | ICD-10-CM | POA: Diagnosis present

## 2021-10-26 DIAGNOSIS — R778 Other specified abnormalities of plasma proteins: Secondary | ICD-10-CM | POA: Diagnosis not present

## 2021-10-26 DIAGNOSIS — F439 Reaction to severe stress, unspecified: Secondary | ICD-10-CM

## 2021-10-26 DIAGNOSIS — Z1159 Encounter for screening for other viral diseases: Secondary | ICD-10-CM

## 2021-10-26 DIAGNOSIS — D529 Folate deficiency anemia, unspecified: Secondary | ICD-10-CM | POA: Diagnosis present

## 2021-10-26 DIAGNOSIS — Z452 Encounter for adjustment and management of vascular access device: Secondary | ICD-10-CM

## 2021-10-26 DIAGNOSIS — Z131 Encounter for screening for diabetes mellitus: Secondary | ICD-10-CM

## 2021-10-26 DIAGNOSIS — I120 Hypertensive chronic kidney disease with stage 5 chronic kidney disease or end stage renal disease: Secondary | ICD-10-CM | POA: Diagnosis not present

## 2021-10-26 DIAGNOSIS — Z881 Allergy status to other antibiotic agents status: Secondary | ICD-10-CM

## 2021-10-26 LAB — CBC WITH DIFFERENTIAL/PLATELET
Abs Immature Granulocytes: 0.06 10*3/uL (ref 0.00–0.07)
Basophils Absolute: 0 10*3/uL (ref 0.0–0.1)
Basophils Relative: 0 %
Eosinophils Absolute: 0 10*3/uL (ref 0.0–0.5)
Eosinophils Relative: 0 %
HCT: 18.1 % — ABNORMAL LOW (ref 36.0–46.0)
Hemoglobin: 6 g/dL — ABNORMAL LOW (ref 12.0–15.0)
Immature Granulocytes: 1 %
Lymphocytes Relative: 4 %
Lymphs Abs: 0.5 10*3/uL — ABNORMAL LOW (ref 0.7–4.0)
MCH: 34.5 pg — ABNORMAL HIGH (ref 26.0–34.0)
MCHC: 33.1 g/dL (ref 30.0–36.0)
MCV: 104 fL — ABNORMAL HIGH (ref 80.0–100.0)
Monocytes Absolute: 0.3 10*3/uL (ref 0.1–1.0)
Monocytes Relative: 2 %
Neutro Abs: 10.7 10*3/uL — ABNORMAL HIGH (ref 1.7–7.7)
Neutrophils Relative %: 93 %
Platelets: 123 10*3/uL — ABNORMAL LOW (ref 150–400)
RBC: 1.74 MIL/uL — ABNORMAL LOW (ref 3.87–5.11)
RDW: 20.3 % — ABNORMAL HIGH (ref 11.5–15.5)
WBC: 11.6 10*3/uL — ABNORMAL HIGH (ref 4.0–10.5)
nRBC: 0 % (ref 0.0–0.2)

## 2021-10-26 LAB — LACTIC ACID, PLASMA: Lactic Acid, Venous: 3.9 mmol/L (ref 0.5–1.9)

## 2021-10-26 LAB — BRAIN NATRIURETIC PEPTIDE: B Natriuretic Peptide: >4500 pg/mL — ABNORMAL HIGH (ref 0.0–100.0)

## 2021-10-26 LAB — PROTIME-INR
INR: 1.2 (ref 0.8–1.2)
Prothrombin Time: 15 seconds (ref 11.4–15.2)

## 2021-10-26 LAB — HCG, QUANTITATIVE, PREGNANCY: hCG, Beta Chain, Quant, S: 4 m[IU]/mL (ref <5)

## 2021-10-26 LAB — RESP PANEL BY RT-PCR (FLU A&B, COVID) ARPGX2
Influenza A by PCR: NEGATIVE
Influenza B by PCR: NEGATIVE
SARS Coronavirus 2 by RT PCR: NEGATIVE

## 2021-10-26 LAB — APTT: aPTT: 31 seconds (ref 24–36)

## 2021-10-26 MED ORDER — SODIUM CHLORIDE 0.9 % IV SOLN
500.0000 mg | INTRAVENOUS | Status: DC
  Administered 2021-10-27: 500 mg via INTRAVENOUS
  Filled 2021-10-26 (×2): qty 500

## 2021-10-26 MED ORDER — LACTATED RINGERS IV SOLN: INTRAVENOUS | Status: DC

## 2021-10-26 MED ORDER — HYDROXYZINE HCL 10 MG PO TABS: 10.0000 mg | ORAL_TABLET | Freq: Three times a day (TID) | ORAL | 0 refills | Status: DC | PRN

## 2021-10-26 MED ORDER — IPRATROPIUM-ALBUTEROL 0.5-2.5 (3) MG/3ML IN SOLN: 3.0000 mL | RESPIRATORY_TRACT | Status: DC | PRN

## 2021-10-26 MED ORDER — IPRATROPIUM-ALBUTEROL 0.5-2.5 (3) MG/3ML IN SOLN: 3.0000 mL | Freq: Four times a day (QID) | RESPIRATORY_TRACT | Status: DC | PRN

## 2021-10-26 MED ORDER — POLYETHYLENE GLYCOL 3350 17 G PO PACK: 17.0000 g | PACK | Freq: Every day | ORAL | Status: DC | PRN

## 2021-10-26 MED ORDER — DOCUSATE SODIUM 100 MG PO CAPS: 100.0000 mg | ORAL_CAPSULE | Freq: Two times a day (BID) | ORAL | Status: DC | PRN

## 2021-10-26 MED ORDER — NITROGLYCERIN IN D5W 200-5 MCG/ML-% IV SOLN
0.0000 ug/min | INTRAVENOUS | Status: DC
  Administered 2021-10-26: 80 ug/min via INTRAVENOUS
  Filled 2021-10-26: qty 250

## 2021-10-26 MED ORDER — HYDROCHLOROTHIAZIDE 12.5 MG PO TABS: 12.5000 mg | ORAL_TABLET | Freq: Every day | ORAL | 0 refills | Status: DC

## 2021-10-26 MED ORDER — BUDESONIDE 0.25 MG/2ML IN SUSP
0.2500 mg | Freq: Two times a day (BID) | RESPIRATORY_TRACT | Status: DC
  Administered 2021-10-27 – 2021-10-28 (×3): 0.25 mg via RESPIRATORY_TRACT
  Filled 2021-10-26 (×3): qty 2

## 2021-10-26 MED ORDER — FUROSEMIDE 10 MG/ML IJ SOLN
80.0000 mg | Freq: Once | INTRAMUSCULAR | Status: AC
  Administered 2021-10-26: 80 mg via INTRAVENOUS
  Filled 2021-10-26: qty 8

## 2021-10-26 MED ORDER — SODIUM CHLORIDE 0.9 % IV SOLN
1.0000 g | INTRAVENOUS | Status: DC
  Administered 2021-10-27: 1 g via INTRAVENOUS
  Filled 2021-10-26: qty 10
  Filled 2021-10-26: qty 1

## 2021-10-26 NOTE — Telephone Encounter (Signed)
Pt called in concerned that her BP is elevated and she is short of breath for the last couple of days.   She's sure she had Covid with symptoms starting on Oct. 31, 2022 but she never tested for it.   She lost her taste and had body aches and felt bad for the last couple of weeks.   She feels fine now and her taste has returned but her friend checked her BP for her yesterday and it was 180/110 and she's been feeling short of breath for the last couple of days.  She called before the office opened and their are no appts available within the protocol timeframe of being seen within 4 hours.   [facility] does not schedule with Serafina Royals, NP yet so I've sent a note to [facility] asking them to call her for an appt.  Pt was agreeable to this plan.  I went over the s/s to go to the ED:  worsening shortness of breath, wheezing, sudden headache, dizziness or feeling worse.   Pt. Verbalized understanding.  I sent my notes to [facility]

## 2021-10-26 NOTE — ED Notes (Signed)
Dr. Jari Pigg at the bedside

## 2021-10-26 NOTE — Patient Instructions (Signed)
High-Protein and High-Calorie Diet Eating high-protein and high-calorie foods can help you to gain weight, heal after an injury, and recover after an illness or surgery. The specific amount of daily protein and calories you need depends on: Your body weight. The reason this diet is recommended for you. Generally, a high-protein, high-calorie diet involves: Eating 250-500 extra calories each day. Making sure that you get enough of your daily calories from protein. Ask your health care provider how many of your calories should come from protein. Talk with a health care provider or a dietitian about how much protein and how many calories you need each day. Follow the diet as directed by your health care provider. What are tips for following this plan? Reading food labels Check the nutrition facts label for calories, grams of fat and protein. Items with more than 4 grams of protein are high-protein foods. Preparing meals Add whole milk, half-and-half, or heavy cream to cereal, pudding, soup, or hot cocoa. Add whole milk to instant breakfast drinks. Add peanut butter to oatmeal or smoothies. Add powdered milk to baked goods, smoothies, or milkshakes. Add powdered milk, cream, or butter to mashed potatoes. Add cheese to cooked vegetables. Make whole-milk yogurt parfaits. Top them with granola, fruit, or nuts. Add cottage cheese to fruit. Add avocado, cheese, or both to sandwiches or salads. Add avocado to smoothies. Add meat, poultry, or seafood to rice, pasta, casseroles, salads, and soups. Use mayonnaise when making egg salad, chicken salad, or tuna salad. Use peanut butter as a dip for fruits and vegetables or as a topping for pretzels, celery, or crackers. Add beans to casseroles, dips, and spreads. Add pureed beans to sauces and soups. Replace calorie-free drinks with calorie-containing drinks, such as milk and fruit juice. Replace water with milk or heavy cream when making foods such as  oatmeal, pudding, or cocoa. Add oil or butter to cooked vegetables and grains. Add cream cheese to sandwiches or as a topping on crackers and bread. Make cream-based pastas and soups. General information Ask your health care provider if you should take a nutritional supplement. Try to eat six small meals each day instead of three large meals. A general goal is to eat every 2 to 3 hours. Eat a balanced diet. In each meal, include one food that is high in protein and one food with fat in it. Keep nutritious snacks available, such as nuts, trail mixes, dried fruit, and yogurt. If you have kidney disease or diabetes, talk with your health care provider about how much protein is safe for you. Too much protein may put extra stress on your kidneys. Drink your calories. Choose high-calorie drinks and have them after your meals. Consider setting a timer to remind you to eat. You will want to eat even if you do not feel very hungry. What high-protein foods should I eat? Vegetables Soybeans. Peas. Grains Quinoa. Bulgur wheat. Buckwheat. Meats and other proteins Beef, pork, and poultry. Fish and seafood. Eggs. Tofu. Textured vegetable protein (TVP). Peanut butter. Nuts and seeds. Dried beans. Protein powders. Hummus. Dairy Whole milk. Whole-milk yogurt. Powdered milk. Cheese. Cottage Cheese. Eggnog. Beverages High-protein supplement drinks. Soy milk. Other foods Protein bars. The items listed above may not be a complete list of foods and beverages you can eat and drink. Contact a dietitian for more information. What high-calorie foods should I eat? Fruits Dried fruit. Fruit leather. Canned fruit in syrup. Fruit juice. Avocado. Vegetables Vegetables cooked in oil or butter. Fried potatoes. Grains Pasta. Quick   breads. Muffins. Pancakes. Ready-to-eat cereal. Meats and other proteins Peanut butter. Nuts and seeds. Dairy Heavy cream. Whipped cream. Cream cheese. Sour cream. Ice cream. Custard.  Pudding. Whole milk dairy products. Beverages Meal-replacement beverages. Nutrition shakes. Fruit juice. Seasonings and condiments Salad dressing. Mayonnaise. Alfredo sauce. Fruit preserves or jelly. Honey. Syrup. Sweets and desserts Cake. Cookies. Pie. Pastries. Candy bars. Chocolate. Fats and oils Butter or margarine. Oil. Gravy. Other foods Meal-replacement bars. The items listed above may not be a complete list of foods and beverages you can eat and drink. Contact a dietitian for more information. Summary A high-protein, high-calorie diet can help you gain weight or heal faster after an injury, illness, or surgery. To increase your protein and calories, add ingredients such as whole milk, peanut butter, cheese, beans, meat, or seafood to meal items. To get enough extra calories each day, include high-calorie foods and beverages at each meal. Adding a high-calorie drink or shake can be an easy way to help you get enough calories each day. Talk with your healthcare provider or dietitian about the best options for you. This information is not intended to replace advice given to you by your health care provider. Make sure you discuss any questions you have with your health care provider. Document Revised: 11/08/2020 Document Reviewed: 11/08/2020 Elsevier Patient Education  2022 Elsevier Inc.  

## 2021-10-26 NOTE — ED Notes (Signed)
Pt placed on bair hugger

## 2021-10-26 NOTE — Progress Notes (Signed)
Name: Joy Ray   MRN: [id number]    DOB: 05/29/1966   Date:10/26/2021       Progress Note  Subjective  Chief Complaint  Elevated Blood Pressure  HPI  Uncontrolled HTN: bp was normal during her well visit 04/22 , but it was elevated the previous year when she came for a CPE. She states   Sudden weight loss: she has lost 8 lbs in the past week. Police busted into her house on 10/28 and took her to [facility], she stayed there for a couple of hours and was sent home, at the same time mother has been very sick with complications of breast cancer and recently sent home but is very frail. She states since she left [place] jail she noticed fatigue and lack of appetite, also SOB and felt like she had COVID-19. Currently she only has SOB, appetite has improved but she BMI is below 18    Patient Active Problem List   Diagnosis Date Noted   Asymptomatic hypertensive urgency 11/12/2019   History of smoking 10-25 pack years 04/18/2018   Colon cancer screening 04/05/2016   Allergic rhinitis 10/13/2015   H/O abnormal cervical Papanicolaou smear 10/13/2015   Hypertension goal BP (blood pressure) < 140/90 10/13/2015   Encounter for surveillance of injectable contraceptive 10/13/2015    Past Surgical History:  Procedure Laterality Date   COLONOSCOPY WITH PROPOFOL N/A 04/21/2016   Procedure: COLONOSCOPY WITH PROPOFOL;  Surgeon: Lucilla Lame, MD;  Location: [facility];  Service: Endoscopy;  Laterality: N/A;   TUBAL LIGATION  1994   After G1P1001    Family History  Problem Relation Age of Onset   Cancer Mother        breast   Breast cancer Mother 16   Breast cancer Maternal Aunt     Social History   Tobacco Use   Smoking status: Light Smoker    Packs/day: 0.50    Years: 20.00    Pack years: 10.00    Types: Cigarettes    Last attempt to quit: 04/21/2016    Years since quitting: 5.5   Smokeless tobacco: Never   Tobacco comments:    pt has cut back to 1 or 2  cigs/day  Substance Use Topics   Alcohol use: Yes    Alcohol/week: 1.0 standard drink    Types: 1 Glasses of wine per week    Comment:       Current Outpatient Medications:    fluticasone (FLONASE) 50 MCG/ACT nasal spray, SHAKE LIQUID AND USE 2 SPRAYS IN EACH NOSTRIL EVERY DAY, Disp: 16 g, Rfl: 3   medroxyPROGESTERone (DEPO-PROVERA) 150 MG/ML injection, Inject every 3 months, Disp: 1 mL, Rfl: 1   medroxyPROGESTERone Acetate 150 MG/ML SUSY, SMARTSIG:1 Milliliter(s) IM Every 3 Weeks, Disp: , Rfl:   Allergies  Allergen Reactions   Ciprofloxacin     Pt denies    I personally reviewed active problem list, medication list, allergies, family history, social history, health maintenance with the patient/caregiver today.   ROS  Constitutional: Negative for fever or weight change.  Respiratory: Negative for cough and shortness of breath.   Cardiovascular: Negative for chest pain or palpitations.  Gastrointestinal: Negative for abdominal pain, no bowel changes.  Musculoskeletal: Negative for gait problem or joint swelling.  Skin: Negative for rash.  Neurological: Negative for dizziness or headache.  No other specific complaints in a complete review of systems (except as listed in HPI above).   Objective  Vitals:  10/26/21 1343  BP: (!) 182/112  Pulse: 91  Resp: 16  Temp: 97.8 F (36.6 C)  SpO2: 97%  Weight: 118 lb (53.5 kg)  Height: 5\' 8"  (1.727 m)    Body mass index is 17.94 kg/m.  Physical Exam  Constitutional: Patient appears well-developed and well-nourished. No distress.  HENT: Head: Normocephalic and atraumatic. Ears: B TMs ok, no erythema or effusion; Nose: Nose normal. Mouth/Throat: Oropharynx is clear and moist. No oropharyngeal exudate.  Eyes: Conjunctivae and EOM are normal. Pupils are equal, round, and reactive to light. No scleral icterus.  Neck: Normal range of motion. Neck supple. No JVD present. No thyromegaly present.  Cardiovascular: Normal rate,  regular rhythm and normal heart sounds.  No murmur heard. No BLE edema. Pulmonary/Chest: Effort normal and breath sounds normal. No respiratory distress. Abdominal: Soft. Bowel sounds are normal, no distension. There is no tenderness. no masses Musculoskeletal: Normal range of motion, no joint effusions. No gross deformities Neurological: he is alert and oriented to person, place, and time. No cranial nerve deficit. Coordination, balance, strength, speech and gait are normal.  Skin: Skin is warm and dry. No rash noted. No erythema.  Psychiatric: Patient has a normal mood and affect. behavior is normal. Judgment and thought content normal.     PHQ2/9: Depression screen [facility] 2/9 10/26/2021 03/24/2021 11/12/2019 08/30/2018 04/18/2018  Decreased Interest 0 0 0 0 0  Down, Depressed, Hopeless 0 0 0 0 0  PHQ - 2 Score 0 0 0 0 0  Altered sleeping 0 - 1 - -  Tired, decreased energy 0 - 0 - -  Change in appetite 0 - 0 - -  Feeling bad or failure about yourself  0 - 0 - -  Trouble concentrating 0 - 0 - -  Moving slowly or fidgety/restless 0 - 0 - -  Suicidal thoughts 0 - 0 - -  PHQ-9 Score 0 - 1 - -  Difficult doing work/chores - - Not difficult at all - -    phq 9 is negative   Fall Risk: Fall Risk  10/26/2021 03/24/2021 11/12/2019 08/30/2018 04/18/2018  Falls in the past year? 0 0 0 No No  Number falls in past yr: 0 0 0 - -  Injury with Fall? 0 0 0 - -  Risk for fall due to : No Fall Risks - - - -  Follow up Falls prevention discussed Falls evaluation completed - - -      Functional Status Survey: Is the patient deaf or have difficulty hearing?: No Does the patient have difficulty seeing, even when wearing glasses/contacts?: No Does the patient have difficulty concentrating, remembering, or making decisions?: No Does the patient have difficulty walking or climbing stairs?: No Does the patient have difficulty dressing or bathing?: No Does the patient have difficulty doing errands alone such as  visiting a doctor's office or shopping?: No    Assessment & Plan  1. Hypertension, uncontrolled  - CBC with Differential/Platelet - COMPLETE METABOLIC PANEL WITH GFR - TSH - Microalbumin / creatinine urine ratio - hydrochlorothiazide (HYDRODIURIL) 12.5 MG tablet; Take 1 tablet (12.5 mg total) by mouth daily. For bp  Dispense: 30 tablet; Refill: 0  2. Mild protein-calorie malnutrition ([place])  Discussed taking extra calories   3. SOB (shortness of breath)  - DG Chest 2 View; Future  4. Current smoker  - DG Chest 2 View; Future  5. Encounter for screening for HIV  - HIV Antibody (routine testing w rflx)  6.  Encounter for hepatitis C screening test for low risk patient  - Hepatitis C antibody  7. Lipid screening  - Lipid panel  8. Diabetes mellitus screening  - Hemoglobin A1c  9. Need for pneumococcal vaccination  - Pneumococcal conjugate vaccine 20-valent (Prevnar 20)  10. Stress  - hydrOXYzine (ATARAX/VISTARIL) 10 MG tablet; Take 1 tablet (10 mg total) by mouth 3 (three) times daily as needed.  Dispense: 30 tablet; Refill: 0  11. Needs flu shot  - Flu Vaccine QUAD High Dose(Fluad)  12. Decreased breath sounds at right lung base  - DG Chest 2 View; Future

## 2021-10-26 NOTE — ED Notes (Signed)
Intensivist NP at the bedside

## 2021-10-26 NOTE — Telephone Encounter (Signed)
Reason for Disposition  [1] MILD difficulty breathing (e.g., minimal/no SOB at rest, SOB with walking, pulse <100) AND [2] NEW-onset or WORSE than normal  Answer Assessment - Initial Assessment Questions 1. BLOOD PRESSURE: "What is the blood pressure?" "Did you take at least two measurements 5 minutes apart?"     Pt  calling in with BP 180/110 yesterday.    Friend took it yesterday for her.    I think I had a virus.   I'm short of breath and my taste came and gone.   I'm fine now.   I need my breathing back right.   I don't know if that has anything to do with my BP.    I wondered if I had Covid.   I had the Covid shot.    When I lost my taste I figured I had Covid so I just laid around. 2. ONSET: "When did you take your blood pressure?"     Yesterday    This last 10 days have been miserable. 3. HOW: "How did you obtain the blood pressure?" (e.g., visiting nurse, automatic home BP monitor)     A friend took my BP yesterday with their machine. 4. HISTORY: "Do you have a history of high blood pressure?"     Off and on.    5. MEDICATIONS: "Are you taking any medications for blood pressure?" "Have you missed any doses recently?"     No medications 6. OTHER SYMPTOMS: "Do you have any symptoms?" (e.g., headache, chest pain, blurred vision, difficulty breathing, weakness)     Shortness of breath for  last couple of days.  No coughing or fever.   No sore throat or runny nose.   No vomiting or diarrhea.    7. PREGNANCY: "Is there any chance you are pregnant?" "When was your last menstrual period?"     No I'm on Depo  Answer Assessment - Initial Assessment Questions 1. RESPIRATORY STATUS: "Describe your breathing?" (e.g., wheezing, shortness of breath, unable to speak, severe coughing)      This has been going on for the last couple of days.   I think I had Covid because I lost my taste but it's back now.   I never tested for Covid. 2. ONSET: "When did this breathing problem begin?"      A couple of  days ago 3. PATTERN "Does the difficult breathing come and go, or has it been constant since it started?"      I had body aches.   I get tired easily.   The shortness of breath comes and goes.  No lung history. 4. SEVERITY: "How bad is your breathing?" (e.g., mild, moderate, severe)    - MILD: No SOB at rest, mild SOB with walking, speaks normally in sentences, can lie down, no retractions, pulse < 100.    - MODERATE: SOB at rest, SOB with minimal exertion and prefers to sit, cannot lie down flat, speaks in phrases, mild retractions, audible wheezing, pulse 100-120.    - SEVERE: Very SOB at rest, speaks in single words, struggling to breathe, sitting hunched forward, retractions, pulse > 120      I'm short of breath with activity. 5. RECURRENT SYMPTOM: "Have you had difficulty breathing before?" If Yes, ask: "When was the last time?" and "What happened that time?"      No 6. CARDIAC HISTORY: "Do you have any history of heart disease?" (e.g., heart attack, angina, bypass surgery, angioplasty)  No 7. LUNG HISTORY: "Do you have any history of lung disease?"  (e.g., pulmonary embolus, asthma, emphysema)     No 8. CAUSE: "What do you think is causing the breathing problem?"      I'm not sure but I think I had Covid.  9. OTHER SYMPTOMS: "Do you have any other symptoms? (e.g., dizziness, runny nose, cough, chest pain, fever)     See above no other symptoms except feeling tired, BP high and short of breath. 10. O2 SATURATION MONITOR:  "Do you use an oxygen saturation monitor (pulse oximeter) at home?" If Yes, "What is your reading (oxygen level) today?" "What is your usual oxygen saturation reading?" (e.g., 95%)       No 11. PREGNANCY: "Is there any chance you are pregnant?" "When was your last menstrual period?"       No 12. TRAVEL: "Have you traveled out of the country in the last month?" (e.g., travel history, exposures)       No  Protocols used: Blood Pressure - High-A-AH, Breathing  Difficulty-A-AH

## 2021-10-26 NOTE — ED Notes (Signed)
Pt placed on NRB 15L, Funke MD at bedside

## 2021-10-26 NOTE — Telephone Encounter (Signed)
Appt scheduled with Dr Ancil Boozer for this afternoon

## 2021-10-26 NOTE — ED Triage Notes (Signed)
Pt to ED via EMS from home, per ems pt called out for shortness of breath pt was hypoxic on arrival and placed on NRB. Pt was hypertensive at 242/154 with ems and they gave 10mg  labetalol. Pt has labored breathing and dyspnea. Pt denies pain. CBG 284.

## 2021-10-26 NOTE — ED Notes (Addendum)
RT at the bedside to apply Bipap

## 2021-10-26 NOTE — ED Provider Notes (Addendum)
[facility] Emergency Department Provider Note  ____________________________________________   Event Date/Time   First MD Initiated Contact with Patient 10/26/21 2149     (approximate)  I have reviewed the triage vital signs and the nursing notes.   HISTORY  Chief Complaint Shortness of Breath    HPI Joy Ray is a 55 y.o. female with hypertension who comes in with shortness of breath.  Patient reports shortness of breath over the past few days.  Patient notably hypertensive with EMS given 10 of IV labetalol.  Patient has increased work of breathing.  Denies history of this previously.  Constant, worse with exertion, better at rest.  Denies abdominal pain, leg swelling          Past Medical History:  Diagnosis Date   Allergy    Cigarette smoker    HTN, goal below 140/90    Second degree hemorrhoids     Patient Active Problem List   Diagnosis Date Noted   Asymptomatic hypertensive urgency 11/12/2019   History of smoking 10-25 pack years 04/18/2018   Colon cancer screening 04/05/2016   Allergic rhinitis 10/13/2015   H/O abnormal cervical Papanicolaou smear 10/13/2015   Hypertension goal BP (blood pressure) < 140/90 10/13/2015    Past Surgical History:  Procedure Laterality Date   COLONOSCOPY WITH PROPOFOL N/A 04/21/2016   Procedure: COLONOSCOPY WITH PROPOFOL;  Surgeon: Lucilla Lame, MD;  Location: [facility];  Service: Endoscopy;  Laterality: N/A;   TUBAL LIGATION  1994   After G1P1001    Prior to Admission medications   Medication Sig Start Date End Date Taking? Authorizing Provider  hydrochlorothiazide (HYDRODIURIL) 12.5 MG tablet Take 1 tablet (12.5 mg total) by mouth daily. For bp 10/26/21  Yes Sowles, Drue Stager, MD  hydrOXYzine (ATARAX/VISTARIL) 10 MG tablet Take 1 tablet (10 mg total) by mouth 3 (three) times daily as needed. 10/26/21  Yes Sowles, Drue Stager, MD  fluticasone (FLONASE) 50 MCG/ACT nasal spray SHAKE LIQUID  AND USE 2 SPRAYS IN EACH NOSTRIL EVERY DAY 05/02/19   Poulose, Bethel Born, NP    Allergies Ciprofloxacin  Family History  Problem Relation Age of Onset   Cancer Mother        breast   Breast cancer Mother 53   Breast cancer Maternal Aunt     Social History Social History   Tobacco Use   Smoking status: Light Smoker    Packs/day: 0.50    Years: 20.00    Pack years: 10.00    Types: Cigarettes    Last attempt to quit: 04/21/2016    Years since quitting: 5.5   Smokeless tobacco: Never   Tobacco comments:    pt has cut back to 1 or 2 cigs/day  Vaping Use   Vaping Use: Never used  Substance Use Topics   Alcohol use: Yes    Alcohol/week: 1.0 standard drink    Types: 1 Glasses of wine per week    Comment:     Drug use: No      Review of Systems Constitutional: No fever/chills Eyes: No visual changes. ENT: No sore throat. Cardiovascular: No chest pain Respiratory: Positive for SOB Gastrointestinal: No abdominal pain.  No nausea, no vomiting.  No diarrhea.  No constipation. Genitourinary: Negative for dysuria. Musculoskeletal: Negative for back pain. Skin: Negative for rash. Neurological: Negative for headaches, focal weakness or numbness. All other ROS negative ____________________________________________   PHYSICAL EXAM:  VITAL SIGNS: ED Triage Vitals  Enc Vitals Group  BP 10/26/21 2146 (!) 167/100     Pulse Rate 10/26/21 2146 72     Resp 10/26/21 2146 (!) 35     Temp 10/26/21 2149 (!) 94.2 F (34.6 C)     Temp Source 10/26/21 2149 Rectal     SpO2 10/26/21 2148 (!) 56 %     Weight 10/26/21 2144 117 lb 15.1 oz (53.5 kg)     Height 10/26/21 2144 5\' 8"  (1.727 m)     Head Circumference --      Peak Flow --      Pain Score 10/26/21 2144 0     Pain Loc --      Pain Edu? --      Excl. in [place]? --     Constitutional: Cool to touch, distress Eyes: Conjunctivae are normal. EOMI. Head: Atraumatic. Nose: No congestion/rhinnorhea. Mouth/Throat: Mucous  membranes are moist.   Neck: No stridor. Trachea Midline. FROM Cardiovascular: Normal rate, regular rhythm. Grossly normal heart sounds.  Good peripheral circulation. Respiratory: Increased work of breathing, hypoxic to the 50s Gastrointestinal: Soft and nontender. No distention. No abdominal bruits.  Musculoskeletal: No lower extremity tenderness nor edema.  No joint effusions. Neurologic:  Normal speech and language. No gross focal neurologic deficits are appreciated.  Skin:  Skin is warm, dry and intact. No rash noted. Psychiatric: Mood and affect are normal. Speech and behavior are normal. GU: Deferred   ____________________________________________   LABS (all labs ordered are listed, but only abnormal results are displayed)  Labs Reviewed  LACTIC ACID, PLASMA - Abnormal; Notable for the following components:      Result Value   Lactic Acid, Venous 3.9 (*)    All other components within normal limits  CBC WITH DIFFERENTIAL/PLATELET - Abnormal; Notable for the following components:   WBC 11.6 (*)    RBC 1.74 (*)    Hemoglobin 6.0 (*)    HCT 18.1 (*)    MCV 104.0 (*)    MCH 34.5 (*)    RDW 20.3 (*)    Platelets 123 (*)    Neutro Abs 10.7 (*)    Lymphs Abs 0.5 (*)    All other components within normal limits  BLOOD GAS, ARTERIAL - Abnormal; Notable for the following components:   pCO2 arterial 19 (*)    Bicarbonate 6.2 (*)    Acid-base deficit 21.2 (*)    All other components within normal limits  RESP PANEL BY RT-PCR (FLU A&B, COVID) ARPGX2  CULTURE, BLOOD (ROUTINE X 2)  CULTURE, BLOOD (ROUTINE X 2)  URINE CULTURE  PROTIME-INR  APTT  HCG, QUANTITATIVE, PREGNANCY  LACTIC ACID, PLASMA  COMPREHENSIVE METABOLIC PANEL  URINALYSIS, COMPLETE (UACMP) WITH MICROSCOPIC  BRAIN NATRIURETIC PEPTIDE  URINE DRUG SCREEN, QUALITATIVE ([facility] ONLY)  POC URINE PREG, ED  TYPE AND SCREEN  TROPONIN I (HIGH SENSITIVITY)  TROPONIN I (HIGH SENSITIVITY)    ____________________________________________   ED ECG REPORT I, Vanessa [place], the attending physician, personally viewed and interpreted this ECG.  Normal sinus rate of 75, no ST elevation, no T wave inversions, QTC slightly prolonged ____________________________________________  RADIOLOGY Robert Bellow, personally viewed and evaluated these images (plain radiographs) as part of my medical decision making, as well as reviewing the written report by the radiologist.  ED MD interpretation: Diffuse interstitial opacities bilaterally  Official radiology report(s): DG Chest Portable 1 View  Result Date: 10/26/2021 CLINICAL DATA:  Shortness of breath and hypoxia EXAM: PORTABLE CHEST 1 VIEW COMPARISON:  Chest x-ray 10/26/2021  FINDINGS: The heart and mediastinal contours are unchanged. Aortic calcification. Marked interval worsening of diffuse interstitial and airspace opacities. No pulmonary edema. No pleural effusion. No pneumothorax. No acute osseous abnormality. IMPRESSION: Marked interval worsening of diffuse interstitial and airspace opacities. Findings suggestive of pulmonary edema. Superimposed infection/inflammation not excluded. Electronically Signed   By: Iven Finn M.D.   On: 10/26/2021 22:10    ____________________________________________   PROCEDURES  Procedure(s) performed (including Critical Care):  .1-3 Lead EKG Interpretation Performed by: Vanessa [place], MD Authorized by: Vanessa [place], MD     Interpretation: normal     ECG rate:  70s   ECG rate assessment: normal     Rhythm: sinus rhythm     Ectopy: none     Conduction: normal   .Critical Care Performed by: Vanessa [place], MD Authorized by: Vanessa [place], MD   Critical care provider statement:    Critical care time (minutes):  45   Critical care was necessary to treat or prevent imminent or life-threatening deterioration of the following conditions:  Respiratory failure   Critical care was time spent  personally by me on the following activities:  Blood draw for specimens, development of treatment plan with patient or surrogate, ordering and performing treatments and interventions, ordering and review of laboratory studies, ordering and review of radiographic studies, pulse oximetry, re-evaluation of patient's condition and obtaining history from patient or surrogate   Care discussed with: admitting provider     ____________________________________________   INITIAL IMPRESSION / [place] / ED COURSE   Joy Ray was evaluated in Emergency Department on 10/26/2021 for the symptoms described in the history of present illness. She was evaluated in the context of the global COVID-19 pandemic, which necessitated consideration that the patient might be at risk for infection with the SARS-CoV-2 virus that causes COVID-19. Institutional protocols and algorithms that pertain to the evaluation of patients at risk for COVID-19 are in a state of rapid change based on information released by regulatory bodies including the CDC and federal and state organizations. These policies and algorithms were followed during the patient's care in the ED.     Patient comes in acutely short of breath.  Patient was initially difficult to get a pulse ox on but eventually were able to get when off of her nose and was in the 50s.  Patient initially placed on a nonrebreather with sats still in the 70s.  Chest x-ray shows diffuse pulmonary edema and I am concerned about flash pulmonary edema with her hypertension.  We will start patient on BiPAP, nitro drip to give a dose of IV Lasix.  I have lower suspicion at this time for COVID, flu.  Patient also hypothermic and placed on a Customer service manager.  Holding off on antibiotics at this time given patient has a normal white count I would be worried that the antibiotics could cause fluid overload and I have lower suspicion for sepsis, bacteremia.  Patient's hemoglobin  also noted to be low at 6.  Not sure if this could be dilutional from the fluid overloaded.  However given his severe chest x-ray I think it could be dangerous to try to transfuse her at this time.  We will do a type and screen.  I discussed with the ICU team given how complex this patient is in-house severely ill she is with both her hypothermia, hypoxia on BiPAP.  They have agreed to admit patient.   ____________________________________________   FINAL CLINICAL  IMPRESSION(S) / ED DIAGNOSES   Final diagnoses:  Acute respiratory failure with hypoxia (HCC)  Flash pulmonary edema (HCC)     MEDICATIONS GIVEN DURING THIS VISIT:  Medications  nitroGLYCERIN 50 mg in dextrose 5 % 250 mL (0.2 mg/mL) infusion (110 mcg/min Intravenous Rate/Dose Change 10/26/21 2317)  furosemide (LASIX) injection 80 mg (80 mg Intravenous Given 10/26/21 2308)     ED Discharge Orders     None        Note:  This document was prepared using Dragon voice recognition software and may include unintentional dictation errors.   Vanessa [place], MD 10/26/21 2336    Vanessa [place], MD 10/26/21 [phone]

## 2021-10-27 ENCOUNTER — Inpatient Hospital Stay: Payer: 59

## 2021-10-27 ENCOUNTER — Inpatient Hospital Stay
Admit: 2021-10-27 | Discharge: 2021-10-27 | Disposition: A | Discharge status: Home / Self Care | Payer: 59 | Attending: Nurse Practitioner | Admitting: Nurse Practitioner

## 2021-10-27 ENCOUNTER — Inpatient Hospital Stay (HOSPITAL_COMMUNITY)
Admit: 2021-10-27 | Discharge: 2021-10-27 | Disposition: A | Discharge status: Home / Self Care | Payer: 59 | Attending: Nurse Practitioner | Admitting: Nurse Practitioner

## 2021-10-27 DIAGNOSIS — J810 Acute pulmonary edema: Secondary | ICD-10-CM

## 2021-10-27 DIAGNOSIS — R0602 Shortness of breath: Secondary | ICD-10-CM

## 2021-10-27 DIAGNOSIS — J9601 Acute respiratory failure with hypoxia: Secondary | ICD-10-CM

## 2021-10-27 DIAGNOSIS — R778 Other specified abnormalities of plasma proteins: Secondary | ICD-10-CM

## 2021-10-27 DIAGNOSIS — N179 Acute kidney failure, unspecified: Principal | ICD-10-CM

## 2021-10-27 LAB — BLOOD CULTURE ID PANEL (REFLEXED) - BCID2

## 2021-10-27 LAB — CBC WITH DIFFERENTIAL/PLATELET
Abs Immature Granulocytes: 0.05 10*3/uL (ref 0.00–0.07)
Basophils Absolute: 0.1 10*3/uL (ref 0.0–0.2)
Basophils Absolute: 0 10*3/uL (ref 0.0–0.1)
Basophils Relative: 0 %
Basos: 1 %
EOS (ABSOLUTE): 0 10*3/uL (ref 0.0–0.4)
Eos: 0 %
Eosinophils Absolute: 0 10*3/uL (ref 0.0–0.5)
Eosinophils Relative: 0 %
HCT: 15.9 % — ABNORMAL LOW (ref 36.0–46.0)
Hematocrit: 18.6 % — ABNORMAL LOW (ref 34.0–46.6)
Hemoglobin: 6.1 g/dL — CL (ref 11.1–15.9)
Hemoglobin: 5.5 g/dL — ABNORMAL LOW (ref 12.0–15.0)
Immature Grans (Abs): 0 10*3/uL (ref 0.0–0.1)
Immature Granulocytes: 0 %
Immature Granulocytes: 1 %
Lymphocytes Absolute: 0.6 10*3/uL — ABNORMAL LOW (ref 0.7–3.1)
Lymphocytes Relative: 3 %
Lymphs Abs: 0.3 10*3/uL — ABNORMAL LOW (ref 0.7–4.0)
Lymphs: 7 %
MCH: 34.3 pg — ABNORMAL HIGH (ref 26.6–33.0)
MCH: 35.9 pg — ABNORMAL HIGH (ref 26.0–34.0)
MCHC: 32.8 g/dL (ref 31.5–35.7)
MCHC: 34.6 g/dL (ref 30.0–36.0)
MCV: 105 fL — ABNORMAL HIGH (ref 79–97)
MCV: 103.9 fL — ABNORMAL HIGH (ref 80.0–100.0)
Monocytes Absolute: 0.4 10*3/uL (ref 0.1–0.9)
Monocytes Absolute: 0.4 10*3/uL (ref 0.1–1.0)
Monocytes Relative: 4 %
Monocytes: 4 %
Neutro Abs: 8.9 10*3/uL — ABNORMAL HIGH (ref 1.7–7.7)
Neutrophils Absolute: 8.3 10*3/uL — ABNORMAL HIGH (ref 1.4–7.0)
Neutrophils Relative %: 92 %
Neutrophils: 88 %
Platelets: 137 10*3/uL — ABNORMAL LOW (ref 150–450)
Platelets: 89 10*3/uL — ABNORMAL LOW (ref 150–400)
RBC: 1.78 x10E6/uL — CL (ref 3.77–5.28)
RBC: 1.53 MIL/uL — ABNORMAL LOW (ref 3.87–5.11)
RDW: 17.6 % — ABNORMAL HIGH (ref 11.7–15.4)
RDW: 19 % — ABNORMAL HIGH (ref 11.5–15.5)
WBC: 9.4 10*3/uL (ref 3.4–10.8)
WBC: 9.6 10*3/uL (ref 4.0–10.5)
nRBC: 0 % (ref 0.0–0.2)

## 2021-10-27 LAB — BASIC METABOLIC PANEL
Anion gap: 26 — ABNORMAL HIGH (ref 5–15)
BUN: 166 mg/dL — ABNORMAL HIGH (ref 6–20)
CO2: 13 mmol/L — ABNORMAL LOW (ref 22–32)
Calcium: 7.7 mg/dL — ABNORMAL LOW (ref 8.9–10.3)
Chloride: 96 mmol/L — ABNORMAL LOW (ref 98–111)
Creatinine, Ser: 22.11 mg/dL — ABNORMAL HIGH (ref 0.44–1.00)
GFR, Estimated: 2 mL/min — ABNORMAL LOW (ref >60)
Glucose, Bld: 92 mg/dL (ref 70–99)
Potassium: 4.5 mmol/L (ref 3.5–5.1)
Sodium: 135 mmol/L (ref 135–145)

## 2021-10-27 LAB — ECHOCARDIOGRAM COMPLETE
AR max vel: 3.28 cm2
AV Area VTI: 3.35 cm2
AV Area mean vel: 2.86 cm2
AV Mean grad: 6 mmHg
AV Peak grad: 9.2 mmHg
Ao pk vel: 1.52 m/s
Area-P 1/2: 4.04 cm2
Height: 68 in
MV VTI: 3.35 cm2
S' Lateral: 3 cm
Weight: 1887.14 oz

## 2021-10-27 LAB — MRSA NEXT GEN BY PCR, NASAL: MRSA by PCR Next Gen: NOT DETECTED

## 2021-10-27 LAB — SEDIMENTATION RATE: Sed Rate: 59 mm/hr — ABNORMAL HIGH (ref 0–30)

## 2021-10-27 LAB — CBC
HCT: 15 % — ABNORMAL LOW (ref 36.0–46.0)
Hemoglobin: 5.1 g/dL — ABNORMAL LOW (ref 12.0–15.0)
MCH: 32.7 pg (ref 26.0–34.0)
MCHC: 34 g/dL (ref 30.0–36.0)
MCV: 96.2 fL (ref 80.0–100.0)
Platelets: 90 10*3/uL — ABNORMAL LOW (ref 150–400)
RBC: 1.56 MIL/uL — ABNORMAL LOW (ref 3.87–5.11)
RDW: 20.2 % — ABNORMAL HIGH (ref 11.5–15.5)
WBC: 11.4 10*3/uL — ABNORMAL HIGH (ref 4.0–10.5)
nRBC: 0 % (ref 0.0–0.2)

## 2021-10-27 LAB — HEPATITIS B SURFACE ANTIBODY,QUALITATIVE: Hep B S Ab: NONREACTIVE

## 2021-10-27 LAB — HEMOGLOBIN AND HEMATOCRIT, BLOOD
HCT: 21.4 % — ABNORMAL LOW (ref 36.0–46.0)
HCT: 19.4 % — ABNORMAL LOW (ref 36.0–46.0)
Hemoglobin: 7.2 g/dL — ABNORMAL LOW (ref 12.0–15.0)
Hemoglobin: 6.8 g/dL — ABNORMAL LOW (ref 12.0–15.0)

## 2021-10-27 LAB — COMPREHENSIVE METABOLIC PANEL
ALT: 8 U/L (ref 0–44)
ALT: 9 U/L (ref 0–44)
AST: 19 U/L (ref 15–41)
AST: 16 U/L (ref 15–41)
Albumin: 3.2 g/dL — ABNORMAL LOW (ref 3.5–5.0)
Albumin: 2.7 g/dL — ABNORMAL LOW (ref 3.5–5.0)
Alkaline Phosphatase: 35 U/L — ABNORMAL LOW (ref 38–126)
Alkaline Phosphatase: 34 U/L — ABNORMAL LOW (ref 38–126)
Anion gap: 26 — ABNORMAL HIGH (ref 5–15)
Anion gap: 23 — ABNORMAL HIGH (ref 5–15)
BUN: 143 mg/dL — ABNORMAL HIGH (ref 6–20)
BUN: 147 mg/dL — ABNORMAL HIGH (ref 6–20)
CO2: 8 mmol/L — ABNORMAL LOW (ref 22–32)
CO2: 10 mmol/L — ABNORMAL LOW (ref 22–32)
Calcium: 8.4 mg/dL — ABNORMAL LOW (ref 8.9–10.3)
Calcium: 7.9 mg/dL — ABNORMAL LOW (ref 8.9–10.3)
Chloride: 99 mmol/L (ref 98–111)
Chloride: 100 mmol/L (ref 98–111)
Creatinine, Ser: 21.86 mg/dL — ABNORMAL HIGH (ref 0.44–1.00)
Creatinine, Ser: 22.6 mg/dL — ABNORMAL HIGH (ref 0.44–1.00)
GFR, Estimated: 2 mL/min — ABNORMAL LOW (ref >60)
GFR, Estimated: 2 mL/min — ABNORMAL LOW (ref >60)
Glucose, Bld: 236 mg/dL — ABNORMAL HIGH (ref 70–99)
Glucose, Bld: 104 mg/dL — ABNORMAL HIGH (ref 70–99)
Potassium: 5 mmol/L (ref 3.5–5.1)
Potassium: 4.9 mmol/L (ref 3.5–5.1)
Sodium: 133 mmol/L — ABNORMAL LOW (ref 135–145)
Sodium: 133 mmol/L — ABNORMAL LOW (ref 135–145)
Total Bilirubin: 0.9 mg/dL (ref 0.3–1.2)
Total Bilirubin: 0.9 mg/dL (ref 0.3–1.2)
Total Protein: 5.8 g/dL — ABNORMAL LOW (ref 6.5–8.1)
Total Protein: 5.4 g/dL — ABNORMAL LOW (ref 6.5–8.1)

## 2021-10-27 LAB — BLOOD GAS, ARTERIAL
Acid-base deficit: 19.3 mmol/L — ABNORMAL HIGH (ref 0.0–2.0)
Bicarbonate: 6.8 mmol/L — ABNORMAL LOW (ref 20.0–28.0)
Delivery systems: POSITIVE
Expiratory PAP: 8
FIO2: 1
Inspiratory PAP: 12
Mechanical Rate: 10
O2 Saturation: 100 %
Patient temperature: 37
pCO2 arterial: <19 mmHg — CL (ref 32.0–48.0)
pH, Arterial: 7.21 — ABNORMAL LOW (ref 7.350–7.450)
pO2, Arterial: 414 mmHg — ABNORMAL HIGH (ref 83.0–108.0)

## 2021-10-27 LAB — MICROALBUMIN / CREATININE URINE RATIO
Creatinine, Urine: 141.7 mg/dL
Microalb/Creat Ratio: 3581 mg/g creat — ABNORMAL HIGH (ref 0–29)
Microalbumin, Urine: 5074.2 ug/mL

## 2021-10-27 LAB — GLUCOSE, CAPILLARY
Glucose-Capillary: 94 mg/dL (ref 70–99)
Glucose-Capillary: 103 mg/dL — ABNORMAL HIGH (ref 70–99)

## 2021-10-27 LAB — RETICULOCYTES
Immature Retic Fract: 22.4 % — ABNORMAL HIGH (ref 2.3–15.9)
RBC.: 1.55 MIL/uL — ABNORMAL LOW (ref 3.87–5.11)
Retic Count, Absolute: 179.8 10*3/uL (ref 19.0–186.0)
Retic Ct Pct: 11.6 % — ABNORMAL HIGH (ref 0.4–3.1)

## 2021-10-27 LAB — TROPONIN I (HIGH SENSITIVITY)
Troponin I (High Sensitivity): 160 ng/L (ref <18)
Troponin I (High Sensitivity): 170 ng/L (ref <18)
Troponin I (High Sensitivity): 183 ng/L (ref <18)
Troponin I (High Sensitivity): 185 ng/L (ref <18)

## 2021-10-27 LAB — HEMOGLOBIN A1C
Est. average glucose Bld gHb Est-mCnc: <74 mg/dL
Hgb A1c MFr Bld: <4.2 % — ABNORMAL LOW (ref 4.8–5.6)

## 2021-10-27 LAB — HEPATITIS C ANTIBODY
HCV Ab: NONREACTIVE
Hep C Virus Ab: <0.1 s/co ratio (ref 0.0–0.9)

## 2021-10-27 LAB — FERRITIN: Ferritin: 387 ng/mL — ABNORMAL HIGH (ref 11–307)

## 2021-10-27 LAB — PHOSPHORUS
Phosphorus: 12.1 mg/dL — ABNORMAL HIGH (ref 2.5–4.6)
Phosphorus: 11.5 mg/dL — ABNORMAL HIGH (ref 2.5–4.6)

## 2021-10-27 LAB — CORTISOL: Cortisol, Plasma: 29.2 ug/dL

## 2021-10-27 LAB — LIPID PANEL
Chol/HDL Ratio: 4 ratio (ref 0.0–4.4)
Cholesterol, Total: 196 mg/dL (ref 100–199)
HDL: 49 mg/dL (ref >39)
LDL Chol Calc (NIH): 121 mg/dL — ABNORMAL HIGH (ref 0–99)
Triglycerides: 148 mg/dL (ref 0–149)
VLDL Cholesterol Cal: 26 mg/dL (ref 5–40)

## 2021-10-27 LAB — IRON AND TIBC
Iron: 32 ug/dL (ref 28–170)
Saturation Ratios: 12 % (ref 10.4–31.8)
TIBC: 262 ug/dL (ref 250–450)
UIBC: 230 ug/dL

## 2021-10-27 LAB — PATHOLOGIST SMEAR REVIEW

## 2021-10-27 LAB — TSH
TSH: 8.02 u[IU]/mL — ABNORMAL HIGH (ref 0.450–4.500)
TSH: 5.343 u[IU]/mL — ABNORMAL HIGH (ref 0.350–4.500)

## 2021-10-27 LAB — LACTIC ACID, PLASMA: Lactic Acid, Venous: 1.9 mmol/L (ref 0.5–1.9)

## 2021-10-27 LAB — ABO/RH: ABO/RH(D): O POS

## 2021-10-27 LAB — HEPATITIS B SURFACE ANTIGEN: Hepatitis B Surface Ag: NONREACTIVE

## 2021-10-27 LAB — FOLATE: Folate: 7 ng/mL (ref >5.9)

## 2021-10-27 LAB — PROCALCITONIN: Procalcitonin: 1.49 ng/mL

## 2021-10-27 LAB — HIV ANTIBODY (ROUTINE TESTING W REFLEX): HIV Screen 4th Generation wRfx: NONREACTIVE

## 2021-10-27 LAB — VITAMIN B12: Vitamin B-12: 208 pg/mL (ref 180–914)

## 2021-10-27 LAB — MAGNESIUM
Magnesium: 2 mg/dL (ref 1.7–2.4)
Magnesium: 1.8 mg/dL (ref 1.7–2.4)

## 2021-10-27 LAB — PREPARE RBC (CROSSMATCH)

## 2021-10-27 MED ORDER — METHYLPREDNISOLONE SODIUM SUCC 40 MG IJ SOLR: 40.0000 mg | Freq: Two times a day (BID) | INTRAMUSCULAR | Status: DC

## 2021-10-27 MED ORDER — ONDANSETRON HCL 4 MG/2ML IJ SOLN
INTRAMUSCULAR | Status: AC
  Filled 2021-10-27: qty 2

## 2021-10-27 MED ORDER — FENTANYL CITRATE PF 50 MCG/ML IJ SOSY
25.0000 ug | PREFILLED_SYRINGE | INTRAMUSCULAR | Status: DC | PRN
  Administered 2021-10-27 (×4): 25 ug via INTRAVENOUS
  Filled 2021-10-27 (×5): qty 1

## 2021-10-27 MED ORDER — SODIUM CHLORIDE 0.9 % IV SOLN: 100.0000 mL | INTRAVENOUS | Status: DC | PRN

## 2021-10-27 MED ORDER — ONDANSETRON HCL 4 MG/2ML IJ SOLN
4.0000 mg | Freq: Three times a day (TID) | INTRAMUSCULAR | Status: DC | PRN
  Administered 2021-10-27 – 2021-10-28 (×2): 4 mg via INTRAVENOUS
  Filled 2021-10-27: qty 2

## 2021-10-27 MED ORDER — SODIUM BICARBONATE 8.4 % IV SOLN
INTRAVENOUS | Status: DC
  Filled 2021-10-27 (×3): qty 1000

## 2021-10-27 MED ORDER — FENTANYL CITRATE PF 50 MCG/ML IJ SOSY
50.0000 ug | PREFILLED_SYRINGE | Freq: Once | INTRAMUSCULAR | Status: AC
  Administered 2021-10-27: 50 ug via INTRAVENOUS
  Filled 2021-10-27: qty 1

## 2021-10-27 MED ORDER — LIDOCAINE-PRILOCAINE 2.5-2.5 % EX CREA
1.0000 "application " | TOPICAL_CREAM | CUTANEOUS | Status: DC | PRN
  Filled 2021-10-27: qty 5

## 2021-10-27 MED ORDER — MELATONIN 5 MG PO TABS
5.0000 mg | ORAL_TABLET | Freq: Every day | ORAL | Status: DC
  Administered 2021-10-27 – 2021-11-01 (×6): 5 mg via ORAL
  Filled 2021-10-27 (×6): qty 1

## 2021-10-27 MED ORDER — MIDAZOLAM HCL 2 MG/2ML IJ SOLN
2.0000 mg | Freq: Once | INTRAMUSCULAR | Status: AC
  Administered 2021-10-27: 2 mg via INTRAVENOUS
  Filled 2021-10-27: qty 2

## 2021-10-27 MED ORDER — CHLORHEXIDINE GLUCONATE CLOTH 2 % EX PADS
6.0000 | MEDICATED_PAD | Freq: Every day | CUTANEOUS | Status: DC
  Administered 2021-10-27 – 2021-10-28 (×2): 6 via TOPICAL

## 2021-10-27 MED ORDER — SODIUM CHLORIDE 0.9% IV SOLUTION: Freq: Once | INTRAVENOUS | Status: DC

## 2021-10-27 MED ORDER — SODIUM BICARBONATE 8.4 % IV SOLN
50.0000 meq | Freq: Once | INTRAVENOUS | Status: AC
  Administered 2021-10-27: 50 meq via INTRAVENOUS

## 2021-10-27 MED ORDER — HEPARIN SODIUM (PORCINE) 1000 UNIT/ML DIALYSIS
1000.0000 [IU] | INTRAMUSCULAR | Status: DC | PRN
  Administered 2021-10-27: 1000 [IU] via INTRAVENOUS_CENTRAL

## 2021-10-27 MED ORDER — FENTANYL CITRATE PF 50 MCG/ML IJ SOSY
50.0000 ug | PREFILLED_SYRINGE | INTRAMUSCULAR | Status: DC | PRN
  Administered 2021-10-27 – 2021-10-28 (×2): 50 ug via INTRAVENOUS
  Filled 2021-10-27 (×2): qty 1

## 2021-10-27 MED ORDER — HYDRALAZINE HCL 20 MG/ML IJ SOLN
INTRAMUSCULAR | Status: AC
  Filled 2021-10-27: qty 1

## 2021-10-27 MED ORDER — PENTAFLUOROPROP-TETRAFLUOROETH EX AERO
1.0000 "application " | INHALATION_SPRAY | CUTANEOUS | Status: DC | PRN
  Filled 2021-10-27: qty 30

## 2021-10-27 MED ORDER — CHLORHEXIDINE GLUCONATE CLOTH 2 % EX PADS
6.0000 | MEDICATED_PAD | Freq: Every day | CUTANEOUS | Status: DC
  Administered 2021-10-31: 6 via TOPICAL

## 2021-10-27 MED ORDER — SODIUM BICARBONATE 8.4 % IV SOLN
INTRAVENOUS | Status: AC
  Administered 2021-10-27: 100 meq via INTRAVENOUS
  Filled 2021-10-27: qty 100

## 2021-10-27 MED ORDER — HEPARIN SODIUM (PORCINE) 1000 UNIT/ML IJ SOLN
INTRAMUSCULAR | Status: AC
  Filled 2021-10-27: qty 1

## 2021-10-27 MED ORDER — ALTEPLASE 2 MG IJ SOLR: 2.0000 mg | Freq: Once | INTRAMUSCULAR | Status: DC | PRN

## 2021-10-27 MED ORDER — HYDRALAZINE HCL 20 MG/ML IJ SOLN
10.0000 mg | INTRAMUSCULAR | Status: DC | PRN
  Administered 2021-10-27 – 2021-10-28 (×2): 10 mg via INTRAVENOUS
  Filled 2021-10-27 (×2): qty 1

## 2021-10-27 MED ORDER — LIDOCAINE HCL (PF) 1 % IJ SOLN
5.0000 mL | INTRAMUSCULAR | Status: DC | PRN
  Filled 2021-10-27: qty 5

## 2021-10-27 MED ORDER — HYDRALAZINE HCL 20 MG/ML IJ SOLN
20.0000 mg | Freq: Once | INTRAMUSCULAR | Status: AC
  Administered 2021-10-27: 20 mg via INTRAVENOUS

## 2021-10-27 MED ORDER — NICARDIPINE HCL IN NACL 20-0.86 MG/200ML-% IV SOLN
3.0000 mg/h | INTRAVENOUS | Status: DC
  Administered 2021-10-27: 3 mg/h via INTRAVENOUS
  Filled 2021-10-27 (×2): qty 200

## 2021-10-27 MED ORDER — LABETALOL HCL 5 MG/ML IV SOLN
10.0000 mg | INTRAVENOUS | Status: DC | PRN
  Administered 2021-10-28: 10 mg via INTRAVENOUS
  Filled 2021-10-27: qty 4

## 2021-10-27 MED ORDER — FENTANYL CITRATE PF 50 MCG/ML IJ SOSY
50.0000 ug | PREFILLED_SYRINGE | Freq: Once | INTRAMUSCULAR | Status: AC
  Administered 2021-10-27: 50 ug via INTRAVENOUS

## 2021-10-27 MED ORDER — SODIUM BICARBONATE 8.4 % IV SOLN
100.0000 meq | Freq: Once | INTRAVENOUS | Status: AC
  Filled 2021-10-27: qty 50

## 2021-10-27 NOTE — Progress Notes (Signed)
Pt A&OX4. Bladder scan volume 138 ml at 1100. Tolerated HD. Attempted to insert foley 2X and unable to place. Denies urge to void. Denies abd pain. Per MD Systolic okay [phone]. Per MD use arterial pressure values. Pt vomited after oral care this evening, zofran ordered and administered. PRN fentanyl for headache. Report given to Hetty Ely.

## 2021-10-27 NOTE — Procedures (Signed)
Central Venous Catheter Insertion Procedure Note  Joy Ray  [id number]  10-01-1966  Date:10/27/21  Time:9:16 AM   Provider Performing:Margaretann Abate D Dewaine Conger   Procedure: Insertion of Non-tunneled Central Venous Catheter(36556)with US guidance (16244)    Indication(s) Hemodialysis  Consent Risks of the procedure as well as the alternatives and risks of each were explained to the patient and/or caregiver.  Consent for the procedure was obtained and is signed in the bedside chart  Anesthesia Topical only with 1% lidocaine   Timeout Verified patient identification, verified procedure, site/side was marked, verified correct patient position, special equipment/implants available, medications/allergies/relevant history reviewed, required imaging and test results available.  Sterile Technique Maximal sterile technique including full sterile barrier drape, hand hygiene, sterile gown, sterile gloves, mask, hair covering, sterile ultrasound probe cover (if used).  Procedure Description Area of catheter insertion was cleaned with chlorhexidine and draped in sterile fashion.   With real-time ultrasound guidance a HD catheter was placed into the left internal jugular vein.  Nonpulsatile blood flow and easy flushing noted in all ports.  The catheter was sutured in place and sterile dressing applied.  Complications/Tolerance None; patient tolerated the procedure well. Chest X-ray is ordered to verify placement for internal jugular or subclavian cannulation.  Chest x-ray is not ordered for femoral cannulation.  EBL Minimal  Specimen(s) None   Line secured at the 20 cm mark.  BIOPATCH applied to the insertion site.     Darel Hong, AGACNP-BC [facility] Prefer epic messenger for cross cover needs If after hours, please call E-link

## 2021-10-27 NOTE — Progress Notes (Signed)
Pt taken off bipap and placed on 2lpm [place] sats 100\%, respiratory distress will continue to monitor, RN notified.

## 2021-10-27 NOTE — Progress Notes (Signed)
PHARMACY CONSULT NOTE - FOLLOW UP  Pharmacy Consult for Electrolyte Monitoring and Replacement   Recent Labs: Potassium (mmol/L)  Date Value  10/26/2021 5.0   Calcium (mg/dL)  Date Value  10/26/2021 8.4 (L)   Albumin (g/dL)  Date Value  10/26/2021 3.2 (L)  05/21/2018 4.0   Sodium (mmol/L)  Date Value  10/26/2021 133 (L)  08/30/2018 139     Assessment: 11/8:  Ca = 8.4,  Albumin = 3.2,  Corrected Ca = 9.04  Goal of Therapy:  Electrolytes WNL   Plan:  No additional electrolyte supplementation needed at this time , will recheck electrolytes on 11/09 with AM labs.   Orene Desanctis ,PharmD Clinical Pharmacist 10/27/2021 2:33 AM

## 2021-10-27 NOTE — Progress Notes (Signed)
Hemodialysis:   Patient tolerated first HD tx well, no adverse effects noted.0.5liters removed. Left IJ triple lumen limbs wrapped with gauze post treatment.

## 2021-10-27 NOTE — Procedures (Addendum)
Arterial Catheter Insertion Procedure Note  Joy Ray  [id number]  04-04-1966  Date:10/27/21  Time:5:18 AM    Provider Performing: Karen Kays    Procedure: Insertion of Arterial Line [phone]) with US guidance (32951)   Indication(s) Blood pressure monitoring and/or need for frequent ABGs  Consent Risks of the procedure as well as the alternatives and risks of each were explained to the patient and/or caregiver.  Consent for the procedure was obtained and is signed in the bedside chart  Anesthesia None   Time Out Verified patient identification, verified procedure, site/side was marked, verified correct patient position, special equipment/implants available, medications/allergies/relevant history reviewed, required imaging and test results available.   Sterile Technique Maximal sterile technique including full sterile barrier drape, hand hygiene, sterile gown, sterile gloves, mask, hair covering, sterile ultrasound probe cover (if used).   Procedure Description Area of catheter insertion was cleaned with chlorhexidine and draped in sterile fashion. With real-time ultrasound guidance an arterial catheter was placed into the left radial artery.  Appropriate arterial tracings confirmed on monitor.     Complications/Tolerance None; patient tolerated the procedure well.   EBL Minimal   Specimen(s) None   Joy Falco, DNP, CCRN, FNP-C, AGACNP-BC Acute Care Nurse Practitioner  [facility] Pager: [phone] [place] [facility] [place]

## 2021-10-27 NOTE — Consult Note (Signed)
[facility] CONSULT NOTE    Date: 10/27/2021                  Patient Name:  Joy Ray  MRN: [id number]  DOB: June 18, 1966  Age / Sex: 55 y.o., female         PCP: Delsa Grana, PA-C                 Service Requesting Consult: Critical care                 Reason for Consult: Severe acute kidney injury            History of Present Illness: Patient is a 55 y.o. female with a PMHx of history with history of hypertensive urgency, tobacco abuse, allergic rhinitis, history of hemorrhoids, who was admitted to [facility] on 10/26/2021 for evaluation of progressive shortness of breath.  Patient just had temporary left IJ dialysis catheter placed and received sedation immediately prior to our interview therefore she is lethargic and unable to offer any history at this point in time.  Per review of records it appears that she was seen by her primary care physician approximately 2 days ago.  She was reporting loss of taste, myalgias, fatigue, and chills.  It appears that she had quite elevated blood pressure at home with systolic blood pressure in the 180s.  EMS was called and it appears that when they arrived her blood pressure was quite high at 242/154.  Upon being brought here chest x-ray was performed which showed diffuse pulmonary edema.  Upon initial presentation here her BUN was 143 with a creatinine of 21.  3 years ago her creatinine was found to be normal at 0.96.  Renal function has gotten worse over the course of the hospitalization with BUN now up to 166 with a creatinine of 22.1.  Patient also has significant anemia as hemoglobin is dropped down to 5.1 as well as thrombocytopenia with serum platelets of 90.  Malignant hypertension causing thrombocytopenia is certainly a concern.  Renal ultrasound was performed which showed relatively atrophic kidneys with right kidney 9 cm and left kidney of 8.4 cm.  Kidneys were found to be echogenic as well.  Patient unable to provide  review of systems as she just received sedation for procedure.   Medications: Outpatient medications: Medications Prior to Admission  Medication Sig Dispense Refill Last Dose   hydrochlorothiazide (HYDRODIURIL) 12.5 MG tablet Take 1 tablet (12.5 mg total) by mouth daily. For bp 30 tablet 0 unk   hydrOXYzine (ATARAX/VISTARIL) 10 MG tablet Take 1 tablet (10 mg total) by mouth 3 (three) times daily as needed. 30 tablet 0 prn at prn   fluticasone (FLONASE) 50 MCG/ACT nasal spray SHAKE LIQUID AND USE 2 SPRAYS IN EACH NOSTRIL EVERY DAY 16 g 3 prn at prn    Current medications: Current Facility-Administered Medications  Medication Dose Route Frequency Provider Last Rate Last Admin   0.9 %  sodium chloride infusion (Manually program via Guardrails IV Fluids)   Intravenous Once Lang Snow, NP       azithromycin (ZITHROMAX) 500 mg in sodium chloride 0.9 % 250 mL IVPB  500 mg Intravenous Q24H Lang Snow, NP 250 mL/hr at 10/27/21 0944 500 mg at 10/27/21 0944   budesonide (PULMICORT) nebulizer solution 0.25 mg  0.25 mg Nebulization BID Lang Snow, NP   0.25 mg at 10/27/21 0758   cefTRIAXone (ROCEPHIN) 1 g in sodium chloride 0.9 %  100 mL IVPB  1 g Intravenous Q24H Lang Snow, NP   Stopped at 10/27/21 0434   Chlorhexidine Gluconate Cloth 2 % PADS 6 each  6 each Topical Q0600 Tyler Pita, MD   6 each at 10/27/21 0553   docusate sodium (COLACE) capsule 100 mg  100 mg Oral BID PRN Lang Snow, NP       fentaNYL (SUBLIMAZE) injection 25 mcg  25 mcg Intravenous Q2H PRN Lang Snow, NP   25 mcg at 10/27/21 0228   ipratropium-albuterol (DUONEB) 0.5-2.5 (3) MG/3ML nebulizer solution 3 mL  3 mL Nebulization Q4H PRN Lang Snow, NP       nicardipine (CARDENE) 20mg  in 0.86% saline 290ml IV infusion (0.1 mg/ml)  3-15 mg/hr Intravenous Continuous Lang Snow, NP 30 mL/hr at 10/27/21 0800 3 mg/hr at 10/27/21 0800    nitroGLYCERIN 50 mg in dextrose 5 % 250 mL (0.2 mg/mL) infusion  0-200 mcg/min Intravenous Continuous Vanessa [place], MD 30 mL/hr at 10/27/21 0043 100 mcg/min at 10/27/21 0043   polyethylene glycol (MIRALAX / GLYCOLAX) packet 17 g  17 g Oral Daily PRN Lang Snow, NP       sodium bicarbonate 150 mEq in dextrose 5 % 1,150 mL infusion   Intravenous Continuous Ouma, Bing Neighbors, NP          Allergies: Allergies  Allergen Reactions   Ciprofloxacin     Pt denies      Past Medical History: Past Medical History:  Diagnosis Date   Allergy    Cigarette smoker    HTN, goal below 140/90    Second degree hemorrhoids      Past Surgical History: Past Surgical History:  Procedure Laterality Date   COLONOSCOPY WITH PROPOFOL N/A 04/21/2016   Procedure: COLONOSCOPY WITH PROPOFOL;  Surgeon: Lucilla Lame, MD;  Location: [place];  Service: Endoscopy;  Laterality: N/A;   TUBAL LIGATION  1994   After G1P1001     Family History: Family History  Problem Relation Age of Onset   Cancer Mother        breast   Breast cancer Mother 99   Breast cancer Maternal Aunt      Social History: Social History   Socioeconomic History   Marital status: Single    Spouse name: Not on file   Number of children: 1   Years of education: Not on file   Highest education level: Not on file  Occupational History   Not on file  Tobacco Use   Smoking status: Light Smoker    Packs/day: 0.50    Years: 20.00    Pack years: 10.00    Types: Cigarettes    Last attempt to quit: 04/21/2016    Years since quitting: 5.5   Smokeless tobacco: Never   Tobacco comments:    pt has cut back to 1 or 2 cigs/day  Vaping Use   Vaping Use: Never used  Substance and Sexual Activity   Alcohol use: Yes    Alcohol/week: 1.0 standard drink    Types: 1 Glasses of wine per week    Comment:     Drug use: No   Sexual activity: Not Currently  Other Topics Concern   Not on file  Social History  Narrative   Not on file   Social Determinants of Health   Financial Resource Strain: Low Risk    Difficulty of Paying Living Expenses: Not hard at all  Food Insecurity: No Food  Insecurity   Worried About Charity fundraiser in the Last Year: Never true   [place] in the Last Year: Never true  Transportation Needs: No Transportation Needs   Lack of Transportation (Medical): No   Lack of Transportation (Non-Medical): No  Physical Activity: Insufficiently Active   Days of Exercise per Week: 3 days   Minutes of Exercise per Session: 20 min  Stress: No Stress Concern Present   Feeling of Stress : Only a little  Social Connections: Socially Isolated   Frequency of Communication with Friends and Family: More than three times a week   Frequency of Social Gatherings with Friends and Family: More than three times a week   Attends Religious Services: Never   Marine scientist or Organizations: No   Attends Music therapist: Never   Marital Status: Never married  Human resources officer Violence: Not At Risk   Fear of Current or Ex-Partner: No   Emotionally Abused: No   Physically Abused: No   Sexually Abused: No     Review of Systems: As per HPI  Vital Signs: Blood pressure 138/84, pulse 90, temperature 97.6 F (36.4 C), temperature source Axillary, resp. rate (!) 25, height 5\' 8"  (1.727 m), weight 53.5 kg, SpO2 98 %.  Weight trends: Filed Weights   10/26/21 2144 10/27/21 0305  Weight: 53.5 kg 53.5 kg     Physical Exam: General: Critically ill-appearing  Head: Normocephalic, atraumatic. Moist oral mucosal membranes  Eyes: Anicteric  Neck: Supple  Lungs:  Basilar rales, normal work of breathing  Heart: S1S2 no rubs  Abdomen:  Soft, nontender, bowel sounds present  Extremities: Trace peripheral edema.  Neurologic: Lethargic but arousable  Skin: No acute rash  Access: Left IJ temporary dialysis catheter    Lab results: Basic Metabolic Panel: Recent  Labs  Lab 10/26/21 2148 10/27/21 0233 10/27/21 0616  NA 133* 133* 135  K 5.0 4.9 4.5  CL 99 100 96*  CO2 8* 10* 13*  GLUCOSE 236* 104* 92  BUN 143* 147* 166*  CREATININE 21.86* 22.60* 22.11*  CALCIUM 8.4* 7.9* 7.7*  MG  --  2.0 1.8  PHOS  --   --  12.1*    Liver Function Tests: Recent Labs  Lab 10/26/21 2148 10/27/21 0233  AST 19 16  ALT 8 9  ALKPHOS 35* 34*  BILITOT 0.9 0.9  PROT 5.8* 5.4*  ALBUMIN 3.2* 2.7*   No results for input(s): LIPASE, AMYLASE in the last 168 hours. No results for input(s): AMMONIA in the last 168 hours.  CBC: Recent Labs  Lab 10/26/21 1522 10/26/21 2148 10/27/21 0233 10/27/21 0616  WBC 9.4 11.6* 9.6 11.4*  NEUTROABS 8.3* 10.7* 8.9*  --   HGB 6.1* 6.0* 5.5* 5.1*  HCT 18.6* 18.1* 15.9* 15.0*  MCV 105* 104.0* 103.9* 96.2  PLT 137* 123* 89* 90*    Cardiac Enzymes: No results for input(s): CKTOTAL, CKMB, CKMBINDEX, TROPONINI in the last 168 hours.  BNP: Invalid input(s): POCBNP  CBG: Recent Labs  Lab 10/27/21 0212  GLUCAP 41    Microbiology: Results for orders placed or performed during the hospital encounter of 10/26/21  Blood Culture (routine x 2)     Status: None (Preliminary result)   Collection Time: 10/26/21  9:48 PM   Specimen: BLOOD  Result Value Ref Range Status   Specimen Description BLOOD LEFT FOREARM  Final   Special Requests   Final    BOTTLES DRAWN AEROBIC AND ANAEROBIC Blood  Culture adequate volume   Culture   Final    NO GROWTH < 12 HOURS Performed at [facility], [address] [place], [place] [address]    Report Status PENDING  Incomplete  Resp Panel by RT-PCR (Flu A&B, Covid) Nasopharyngeal Swab     Status: None   Collection Time: 10/26/21  9:56 PM   Specimen: Nasopharyngeal Swab; Nasopharyngeal(NP) swabs in vial transport medium  Result Value Ref Range Status   SARS Coronavirus 2 by RT PCR NEGATIVE NEGATIVE Final    Comment: (NOTE) SARS-CoV-2 target nucleic acids are NOT  DETECTED.  The SARS-CoV-2 RNA is generally detectable in upper respiratory specimens during the acute phase of infection. The lowest concentration of SARS-CoV-2 viral copies this assay can detect is 138 copies/mL. A negative result does not preclude SARS-Cov-2 infection and should not be used as the sole basis for treatment or other patient management decisions. A negative result may occur with  improper specimen collection/handling, submission of specimen other than nasopharyngeal swab, presence of viral mutation(s) within the areas targeted by this assay, and inadequate number of viral copies(<138 copies/mL). A negative result must be combined with clinical observations, patient history, and epidemiological information. The expected result is Negative.  Fact Sheet for Patients:  EntrepreneurPulse.com.au  Fact Sheet for Healthcare Providers:  IncredibleEmployment.be  This test is no t yet approved or cleared by the [place] FDA and  has been authorized for detection and/or diagnosis of SARS-CoV-2 by FDA under an Emergency Use Authorization (EUA). This EUA will remain  in effect (meaning this test can be used) for the duration of the COVID-19 declaration under Section 564(b)(1) of the Act, 21 U.S.C.section 360bbb-3(b)(1), unless the authorization is terminated  or revoked sooner.       Influenza A by PCR NEGATIVE NEGATIVE Final   Influenza B by PCR NEGATIVE NEGATIVE Final    Comment: (NOTE) The Xpert Xpress SARS-CoV-2/FLU/RSV plus assay is intended as an aid in the diagnosis of influenza from Nasopharyngeal swab specimens and should not be used as a sole basis for treatment. Nasal washings and aspirates are unacceptable for Xpert Xpress SARS-CoV-2/FLU/RSV testing.  Fact Sheet for Patients: EntrepreneurPulse.com.au  Fact Sheet for Healthcare Providers: IncredibleEmployment.be  This test is not yet  approved or cleared by the [place] FDA and has been authorized for detection and/or diagnosis of SARS-CoV-2 by FDA under an Emergency Use Authorization (EUA). This EUA will remain in effect (meaning this test can be used) for the duration of the COVID-19 declaration under Section 564(b)(1) of the Act, 21 U.S.C. section 360bbb-3(b)(1), unless the authorization is terminated or revoked.  Performed at [facility], [address] [place], [place] [address]   Blood Culture (routine x 2)     Status: None (Preliminary result)   Collection Time: 10/26/21  9:56 PM   Specimen: BLOOD  Result Value Ref Range Status   Specimen Description BLOOD RIGHT FOREARM  Final   Special Requests   Final    BOTTLES DRAWN AEROBIC AND ANAEROBIC Blood Culture adequate volume   Culture   Final    NO GROWTH < 12 HOURS Performed at [facility], [address], [place], [place] [address]    Report Status PENDING  Incomplete  MRSA Next Gen by PCR, Nasal     Status: None   Collection Time: 10/27/21  2:37 AM   Specimen: Nasal Mucosa; Nasal Swab  Result Value Ref Range Status   MRSA by PCR Next Gen NOT DETECTED  NOT DETECTED Final    Comment: (NOTE) The GeneXpert MRSA Assay (FDA approved for NASAL specimens only), is one component of a comprehensive MRSA colonization surveillance program. It is not intended to diagnose MRSA infection nor to guide or monitor treatment for MRSA infections. Test performance is not FDA approved in patients less than 27 years old. Performed at [facility], [address] [place], [place] [address]     Coagulation Studies: Recent Labs    10/26/21 2148  LABPROT 15.0  INR 1.2    Urinalysis: No results for input(s): COLORURINE, LABSPEC, PHURINE, GLUCOSEU, HGBUR, BILIRUBINUR, KETONESUR, PROTEINUR, UROBILINOGEN, NITRITE, LEUKOCYTESUR in the last 72 hours.  Invalid input(s): APPERANCEUR    Imaging: US RENAL  Result Date:  10/27/2021 CLINICAL DATA:  Acute kidney injury. EXAM: RENAL / URINARY TRACT ULTRASOUND COMPLETE COMPARISON:  None. FINDINGS: Right Kidney: Renal measurements: 9.0 x 3.2 x 4.2 cm = volume: 63.9 mL. Increased parenchymal echogenicity. No mass or hydronephrosis visualized. Left Kidney: Renal measurements: 8.4 x 4.3 x 4.6 cm = volume: 88.7 mL. Increased parenchymal echogenicity. No mass or hydronephrosis visualized. Bladder: Appears normal for degree of bladder distention. Other: Incidental bilateral pleural effusions. IMPRESSION: 1. Echogenic kidneys compatible with medical renal disease. No hydronephrosis. 2. Bilateral pleural effusions. Electronically Signed   By: Logan Bores M.D.   On: 10/27/2021 07:48   DG Chest Port 1 View  Result Date: 10/27/2021 CLINICAL DATA:  Central line placement. EXAM: PORTABLE CHEST 1 VIEW COMPARISON:  October 26, 2021. FINDINGS: Interval placement of left internal jugular catheter with distal tip in expected position of the SVC. No pneumothorax is noted. IMPRESSION: Interval placement of left internal jugular catheter with distal tip in expected position of the SVC. Electronically Signed   By: Marijo Conception M.D.   On: 10/27/2021 09:38   DG Chest Portable 1 View  Result Date: 10/26/2021 CLINICAL DATA:  Shortness of breath and hypoxia EXAM: PORTABLE CHEST 1 VIEW COMPARISON:  Chest x-ray 10/26/2021 FINDINGS: The heart and mediastinal contours are unchanged. Aortic calcification. Marked interval worsening of diffuse interstitial and airspace opacities. No pulmonary edema. No pleural effusion. No pneumothorax. No acute osseous abnormality. IMPRESSION: Marked interval worsening of diffuse interstitial and airspace opacities. Findings suggestive of pulmonary edema. Superimposed infection/inflammation not excluded. Electronically Signed   By: Iven Finn M.D.   On: 10/26/2021 22:10     Assessment & Plan: Pt is a 55 y.o. female with a PMHx of history with history of hypertensive  urgency, tobacco abuse, allergic rhinitis, history of hemorrhoids, who was admitted to [facility] on 10/26/2021 for evaluation of progressive shortness of breath.  1.  Acute kidney injury.  Presenting creatinine was very high at 21.86.  Baseline creatinine 0.96 on 08/30/2018.  We have significant concern that this may potentially represent ESRD as patient found to have atrophic and echogenic kidneys.  Patient also significantly anemic upon presentation.  Suspect renal insufficiency now secondary to prolonged malignant hypertension.  Appreciate critical care assistance.  Left temporary dialysis catheter has been placed.  We will obtain hepatitis serologies and also obtain ANA/ANCA/GBM/C3/C4/SPEP/UPEP for additional work-up.  Would not recommend renal biopsy at this time in the setting of malignant hypertension and obvious uremia.  There is high suspicion that this may actually represent ESRD.  In addition given low platelets there is some concern that the patient could have a thrombotic microangiopathy/TTP.  Recommend hematology evaluation.  2.  Acute respiratory failure.  Secondary to malignant hypertension.  Lowering the pressure some should  help to prevent further worsening of pulmonary edema.  3.  Anemia/thrombocytopenia.  As above recommend hematology consultation.  Patient being administered blood transfusion at the moment.  4.  Malignant hypertension.  Initial blood pressure was very high.  Recommend keeping a systolic blood pressure within the range of 170s to 180s  for the next 24 hours and then begin to further improve the blood pressure.  Patient was on a Cardene drip but temporarily held.

## 2021-10-27 NOTE — Progress Notes (Signed)
*  PRELIMINARY RESULTS* Echocardiogram 2D Echocardiogram has been performed.  Joy Ray Versa Craton 10/27/2021, 12:17 PM
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
## 2021-10-27 NOTE — Progress Notes (Signed)
PHARMACY - PHYSICIAN COMMUNICATION CRITICAL VALUE ALERT - BLOOD CULTURE IDENTIFICATION (BCID)  Joy Ray is an 55 y.o. female who presented to [facility] on 10/26/2021 with a chief complaint of shortness of breath  Assessment:  11/8 blood culture with GPC in 1 of 4 bottles.  BCID detected MSSE  Name of physician (or Provider) Contacted: Dr Mortimer Fries  Current antibiotics: none (stopped earlier today)  Changes to prescribed antibiotics recommended:  Recommendations accepted by provider - monitor off antibiotics as suspect contaminant  Results for orders placed or performed during the hospital encounter of 10/26/21  Blood Culture ID Panel (Reflexed) (Collected: 10/26/2021  9:56 PM)  Result Value Ref Range   Enterococcus faecalis NOT DETECTED NOT DETECTED   Enterococcus Faecium NOT DETECTED NOT DETECTED   Listeria monocytogenes NOT DETECTED NOT DETECTED   Staphylococcus species DETECTED (A) NOT DETECTED   Staphylococcus aureus (BCID) NOT DETECTED NOT DETECTED   Staphylococcus epidermidis DETECTED (A) NOT DETECTED   Staphylococcus lugdunensis NOT DETECTED NOT DETECTED   Streptococcus species NOT DETECTED NOT DETECTED   Streptococcus agalactiae NOT DETECTED NOT DETECTED   Streptococcus pneumoniae NOT DETECTED NOT DETECTED   Streptococcus pyogenes NOT DETECTED NOT DETECTED   A.calcoaceticus-baumannii NOT DETECTED NOT DETECTED   Bacteroides fragilis NOT DETECTED NOT DETECTED   Enterobacterales NOT DETECTED NOT DETECTED   Enterobacter cloacae complex NOT DETECTED NOT DETECTED   Escherichia coli NOT DETECTED NOT DETECTED   Klebsiella aerogenes NOT DETECTED NOT DETECTED   Klebsiella oxytoca NOT DETECTED NOT DETECTED   Klebsiella pneumoniae NOT DETECTED NOT DETECTED   Proteus species NOT DETECTED NOT DETECTED   Salmonella species NOT DETECTED NOT DETECTED   Serratia marcescens NOT DETECTED NOT DETECTED   Haemophilus influenzae NOT DETECTED NOT DETECTED   Neisseria meningitidis NOT  DETECTED NOT DETECTED   Pseudomonas aeruginosa NOT DETECTED NOT DETECTED   Stenotrophomonas maltophilia NOT DETECTED NOT DETECTED   Candida albicans NOT DETECTED NOT DETECTED   Candida auris NOT DETECTED NOT DETECTED   Candida glabrata NOT DETECTED NOT DETECTED   Candida krusei NOT DETECTED NOT DETECTED   Candida parapsilosis NOT DETECTED NOT DETECTED   Candida tropicalis NOT DETECTED NOT DETECTED   Cryptococcus neoformans/gattii NOT DETECTED NOT DETECTED   Methicillin resistance mecA/C NOT DETECTED NOT DETECTED    Doreene Eland, PharmD, BCPS.   Work Cell: [phone] 10/27/2021 4:02 PM

## 2021-10-27 NOTE — Progress Notes (Signed)
[name] Progress Note Patient Name: Joy Ray DOB: 04/04/1966 MRN: [id number]   Date of Service  10/27/2021  HPI/Events of Note  Patient admitted with malignant hypertension, acute kidney injury, acute coronary syndrome, congestive heart failure with pulmonary edema, pneumonia also needs to be ruled out, patient's anemia and thrombocytopenia will also need to be worked up to exclude a vasculitic  / DIC diagnosis either causing, or resulting from the extremely high blood pressures, she is currently on Nitroglycerine gtt, and BIPAP.  eICU Interventions  New Patient Evaluation.        Kerry Kass Harl Wiechmann 10/27/2021, 2:59 AM

## 2021-10-28 DIAGNOSIS — Z992 Dependence on renal dialysis: Secondary | ICD-10-CM

## 2021-10-28 DIAGNOSIS — D696 Thrombocytopenia, unspecified: Secondary | ICD-10-CM

## 2021-10-28 DIAGNOSIS — N179 Acute kidney failure, unspecified: Secondary | ICD-10-CM

## 2021-10-28 DIAGNOSIS — N186 End stage renal disease: Secondary | ICD-10-CM

## 2021-10-28 DIAGNOSIS — E538 Deficiency of other specified B group vitamins: Secondary | ICD-10-CM | POA: Diagnosis not present

## 2021-10-28 DIAGNOSIS — D631 Anemia in chronic kidney disease: Secondary | ICD-10-CM

## 2021-10-28 DIAGNOSIS — D529 Folate deficiency anemia, unspecified: Secondary | ICD-10-CM | POA: Diagnosis not present

## 2021-10-28 LAB — PROTEIN ELECTROPHORESIS, SERUM
A/G Ratio: 1.4 (ref 0.7–1.7)
Albumin ELP: 2.7 g/dL — ABNORMAL LOW (ref 2.9–4.4)
Alpha-1-Globulin: 0.3 g/dL (ref 0.0–0.4)
Alpha-2-Globulin: 0.5 g/dL (ref 0.4–1.0)
Beta Globulin: 0.6 g/dL — ABNORMAL LOW (ref 0.7–1.3)
Gamma Globulin: 0.6 g/dL (ref 0.4–1.8)
Globulin, Total: 2 g/dL — ABNORMAL LOW (ref 2.2–3.9)
Total Protein ELP: 4.7 g/dL — ABNORMAL LOW (ref 6.0–8.5)

## 2021-10-28 LAB — CBC WITH DIFFERENTIAL/PLATELET
Abs Immature Granulocytes: 0.13 10*3/uL — ABNORMAL HIGH (ref 0.00–0.07)
Basophils Absolute: 0 10*3/uL (ref 0.0–0.1)
Basophils Relative: 0 %
Eosinophils Absolute: 0 10*3/uL (ref 0.0–0.5)
Eosinophils Relative: 0 %
HCT: 23.2 % — ABNORMAL LOW (ref 36.0–46.0)
Hemoglobin: 8 g/dL — ABNORMAL LOW (ref 12.0–15.0)
Immature Granulocytes: 1 %
Lymphocytes Relative: 3 %
Lymphs Abs: 0.4 10*3/uL — ABNORMAL LOW (ref 0.7–4.0)
MCH: 31.3 pg (ref 26.0–34.0)
MCHC: 34.5 g/dL (ref 30.0–36.0)
MCV: 90.6 fL (ref 80.0–100.0)
Monocytes Absolute: 0.5 10*3/uL (ref 0.1–1.0)
Monocytes Relative: 4 %
Neutro Abs: 11.6 10*3/uL — ABNORMAL HIGH (ref 1.7–7.7)
Neutrophils Relative %: 92 %
Platelets: 114 10*3/uL — ABNORMAL LOW (ref 150–400)
RBC: 2.56 MIL/uL — ABNORMAL LOW (ref 3.87–5.11)
RDW: 21.3 % — ABNORMAL HIGH (ref 11.5–15.5)
Smear Review: NORMAL
WBC: 12.6 10*3/uL — ABNORMAL HIGH (ref 4.0–10.5)
nRBC: 0 % (ref 0.0–0.2)

## 2021-10-28 LAB — ANCA PROFILE
Anti-MPO Antibodies: <0.2 units (ref 0.0–0.9)
Anti-PR3 Antibodies: <0.2 units (ref 0.0–0.9)
Atypical P-ANCA titer: <1:20 {titer}
C-ANCA: <1:20 {titer}
P-ANCA: <1:20 {titer}

## 2021-10-28 LAB — COMPREHENSIVE METABOLIC PANEL
ALT: 14 U/L (ref 0–44)
AST: 17 U/L (ref 15–41)
Albumin: 2.8 g/dL — ABNORMAL LOW (ref 3.5–5.0)
Alkaline Phosphatase: 32 U/L — ABNORMAL LOW (ref 38–126)
Anion gap: 20 — ABNORMAL HIGH (ref 5–15)
BUN: 99 mg/dL — ABNORMAL HIGH (ref 6–20)
CO2: 18 mmol/L — ABNORMAL LOW (ref 22–32)
Calcium: 7.6 mg/dL — ABNORMAL LOW (ref 8.9–10.3)
Chloride: 96 mmol/L — ABNORMAL LOW (ref 98–111)
Creatinine, Ser: 16.56 mg/dL — ABNORMAL HIGH (ref 0.44–1.00)
GFR, Estimated: 2 mL/min — ABNORMAL LOW (ref >60)
Glucose, Bld: 107 mg/dL — ABNORMAL HIGH (ref 70–99)
Potassium: 3.7 mmol/L (ref 3.5–5.1)
Sodium: 134 mmol/L — ABNORMAL LOW (ref 135–145)
Total Bilirubin: 0.9 mg/dL (ref 0.3–1.2)
Total Protein: 5.3 g/dL — ABNORMAL LOW (ref 6.5–8.1)

## 2021-10-28 LAB — ANA W/REFLEX IF POSITIVE: Anti Nuclear Antibody (ANA): NEGATIVE

## 2021-10-28 LAB — HEPATITIS B SURFACE ANTIBODY, QUANTITATIVE: Hep B S AB Quant (Post): <3.1 m[IU]/mL — ABNORMAL LOW (ref >9.9)

## 2021-10-28 LAB — MAGNESIUM: Magnesium: 1.9 mg/dL (ref 1.7–2.4)

## 2021-10-28 LAB — RENAL FUNCTION PANEL
Albumin: 2.9 g/dL — ABNORMAL LOW (ref 3.5–5.0)
Anion gap: 19 — ABNORMAL HIGH (ref 5–15)
BUN: 111 mg/dL — ABNORMAL HIGH (ref 6–20)
CO2: 18 mmol/L — ABNORMAL LOW (ref 22–32)
Calcium: 7.4 mg/dL — ABNORMAL LOW (ref 8.9–10.3)
Chloride: 96 mmol/L — ABNORMAL LOW (ref 98–111)
Creatinine, Ser: 16.05 mg/dL — ABNORMAL HIGH (ref 0.44–1.00)
GFR, Estimated: 2 mL/min — ABNORMAL LOW (ref >60)
Glucose, Bld: 104 mg/dL — ABNORMAL HIGH (ref 70–99)
Phosphorus: 8.7 mg/dL — ABNORMAL HIGH (ref 2.5–4.6)
Potassium: 3.6 mmol/L (ref 3.5–5.1)
Sodium: 133 mmol/L — ABNORMAL LOW (ref 135–145)

## 2021-10-28 LAB — HEPATITIS B SURFACE ANTIGEN: Hepatitis B Surface Ag: NONREACTIVE

## 2021-10-28 LAB — PROTIME-INR
INR: 1.1 (ref 0.8–1.2)
Prothrombin Time: 14.2 seconds (ref 11.4–15.2)

## 2021-10-28 LAB — GLOMERULAR BASEMENT MEMBRANE ANTIBODIES: GBM Ab: <0.2 units (ref 0.0–0.9)

## 2021-10-28 LAB — HEMOGLOBIN AND HEMATOCRIT, BLOOD
HCT: 17.7 % — ABNORMAL LOW (ref 36.0–46.0)
HCT: 22.3 % — ABNORMAL LOW (ref 36.0–46.0)
Hemoglobin: 6.2 g/dL — ABNORMAL LOW (ref 12.0–15.0)
Hemoglobin: 7.9 g/dL — ABNORMAL LOW (ref 12.0–15.0)

## 2021-10-28 LAB — PREPARE RBC (CROSSMATCH)

## 2021-10-28 LAB — C4 COMPLEMENT: Complement C4, Body Fluid: 21 mg/dL (ref 12–38)

## 2021-10-28 LAB — PATHOLOGIST SMEAR REVIEW

## 2021-10-28 LAB — VITAMIN B12: Vitamin B-12: 215 pg/mL (ref 180–914)

## 2021-10-28 LAB — LACTATE DEHYDROGENASE: LDH: 450 U/L — ABNORMAL HIGH (ref 98–192)

## 2021-10-28 LAB — FOLATE: Folate: 4.9 ng/mL — ABNORMAL LOW (ref >5.9)

## 2021-10-28 LAB — HEPATITIS B CORE ANTIBODY, TOTAL: Hep B Core Total Ab: NONREACTIVE

## 2021-10-28 LAB — APTT: aPTT: 30 seconds (ref 24–36)

## 2021-10-28 LAB — HEPATITIS B SURFACE ANTIBODY,QUALITATIVE: Hep B S Ab: NONREACTIVE

## 2021-10-28 LAB — PARATHYROID HORMONE, INTACT (NO CA): PTH: 348 pg/mL — ABNORMAL HIGH (ref 15–65)

## 2021-10-28 LAB — RETIC PANEL
Immature Retic Fract: 16.6 % — ABNORMAL HIGH (ref 2.3–15.9)
RBC.: 2.55 MIL/uL — ABNORMAL LOW (ref 3.87–5.11)
Retic Count, Absolute: 153.3 10*3/uL (ref 19.0–186.0)
Retic Ct Pct: 6 % — ABNORMAL HIGH (ref 0.4–3.1)
Reticulocyte Hemoglobin: 33.4 pg (ref >27.9)

## 2021-10-28 LAB — RHEUMATOID FACTOR: Rheumatoid fact SerPl-aCnc: <10 IU/mL (ref <14.0)

## 2021-10-28 LAB — C3 COMPLEMENT: C3 Complement: 66 mg/dL — ABNORMAL LOW (ref 82–167)

## 2021-10-28 LAB — HIV ANTIBODY (ROUTINE TESTING W REFLEX): HIV Screen 4th Generation wRfx: NONREACTIVE

## 2021-10-28 MED ORDER — AMLODIPINE BESYLATE 10 MG PO TABS
10.0000 mg | ORAL_TABLET | Freq: Every day | ORAL | Status: DC
  Administered 2021-10-28 – 2021-11-01 (×4): 10 mg via ORAL
  Filled 2021-10-28 (×6): qty 1

## 2021-10-28 MED ORDER — CARVEDILOL 6.25 MG PO TABS
6.2500 mg | ORAL_TABLET | Freq: Two times a day (BID) | ORAL | Status: DC
  Administered 2021-10-28 – 2021-11-01 (×7): 6.25 mg via ORAL
  Filled 2021-10-28 (×7): qty 1

## 2021-10-28 MED ORDER — SODIUM CHLORIDE 0.9 % IV SOLN
12.5000 mg | Freq: Four times a day (QID) | INTRAVENOUS | Status: DC | PRN
  Filled 2021-10-28: qty 0.5

## 2021-10-28 MED ORDER — SODIUM CHLORIDE 0.9% IV SOLUTION: Freq: Once | INTRAVENOUS | Status: DC

## 2021-10-28 MED ORDER — RENA-VITE PO TABS
1.0000 | ORAL_TABLET | Freq: Every day | ORAL | Status: DC
  Administered 2021-10-28 – 2021-11-01 (×5): 1 via ORAL
  Filled 2021-10-28 (×5): qty 1

## 2021-10-28 MED ORDER — FOLIC ACID 1 MG PO TABS
1.0000 mg | ORAL_TABLET | Freq: Every day | ORAL | Status: DC
  Administered 2021-10-28 – 2021-11-01 (×5): 1 mg via ORAL
  Filled 2021-10-28 (×5): qty 1

## 2021-10-28 MED ORDER — LOSARTAN POTASSIUM 50 MG PO TABS
100.0000 mg | ORAL_TABLET | Freq: Every day | ORAL | Status: DC
  Administered 2021-10-28 – 2021-11-01 (×4): 100 mg via ORAL
  Filled 2021-10-28 (×6): qty 2

## 2021-10-28 MED ORDER — TRAMADOL HCL 50 MG PO TABS
100.0000 mg | ORAL_TABLET | Freq: Four times a day (QID) | ORAL | Status: DC | PRN
  Administered 2021-10-28 – 2021-10-31 (×2): 100 mg via ORAL
  Filled 2021-10-28 (×2): qty 2

## 2021-10-28 MED ORDER — BUTALBITAL-APAP-CAFFEINE 50-325-40 MG PO TABS
2.0000 | ORAL_TABLET | Freq: Once | ORAL | Status: AC
  Administered 2021-10-28: 2 via ORAL
  Filled 2021-10-28: qty 2

## 2021-10-28 MED ORDER — NEPRO/CARBSTEADY PO LIQD
237.0000 mL | Freq: Two times a day (BID) | ORAL | Status: DC
  Administered 2021-10-29 – 2021-10-31 (×5): 237 mL via ORAL

## 2021-10-28 MED ORDER — VITAMIN B-12 1000 MCG PO TABS
1000.0000 ug | ORAL_TABLET | Freq: Every day | ORAL | Status: DC
  Administered 2021-10-28 – 2021-11-01 (×5): 1000 ug via ORAL
  Filled 2021-10-28 (×5): qty 1

## 2021-10-28 NOTE — Progress Notes (Signed)
New HD start, second treatment, tolerated well, no incidents. Catheter functioned well, maintained BFR. Targeted UF met, 0.5 liter removed. Pt scheduled for third tx on Friday.

## 2021-10-28 NOTE — Progress Notes (Signed)
Per MD will contact nephrology regarding BP and will contact this RN regarding plan.

## 2021-10-28 NOTE — Progress Notes (Signed)
Patient alert and oriented. Room air. No complaints of pain. Intermit headache, MD notified and orders placed. Patient unable to tolerate po this morning. Nausea and vomiting MD made aware. PRN's given this am for blood pressure. Unable to take po. Removed aline. HD completed. Report given to [facility].Continue to assess.

## 2021-10-28 NOTE — Progress Notes (Signed)
Spoke with Dr. Billie Ruddy. Notified MD BP during assessment 216/107. Labetalol given. Currently 202/108. Orders to give hydralazine 10 mg. Continue to assess.

## 2021-10-28 NOTE — Progress Notes (Signed)
Patients BP 130's. Per Dr Billie Ruddy, patient can go down to dialysis unit for dialysis. Continue to monitor.

## 2021-10-28 NOTE — Progress Notes (Signed)
   10/27/21 1300 10/27/21 1315 10/27/21 1330  Vitals  BP (!) 152/92 (!) 148/98 (!) 155/94  MAP (mmHg) 108 113 111  Pulse Rate 96 87 85  ECG Heart Rate 95 87 84  Resp 19 20 20   During Hemodialysis Assessment  Blood Flow Rate (mL/min) 150 mL/min 150 mL/min 150 mL/min  Arterial Pressure (mmHg) -40 mmHg -40 mmHg -40 mmHg  Venous Pressure (mmHg) 20 mmHg 20 mmHg 20 mmHg  Transmembrane Pressure (mmHg) 70 mmHg 70 mmHg 70 mmHg  Ultrafiltration Rate (mL/min) 670 mL/min 670 mL/min 670 mL/min  Dialysate Flow Rate (mL/min) 300 ml/min 300 ml/min 300 ml/min  Conductivity: Machine  13.7 13.7 13.7  HD Safety Checks Performed Yes Yes Yes  Dialysis Fluid Bolus Normal Saline  --   --   Bolus Amount (mL) 250 mL  --   --   Intra-Hemodialysis Comments Tx initiated Progressing as prescribed Progressing as prescribed    10/27/21 1345 10/27/21 1400 10/27/21 1415  Vitals  BP (!) 160/96 (!) 162/94 (!) 156/94  MAP (mmHg) 114 113 112  Pulse Rate 94 85 88  ECG Heart Rate 94 85 91  Resp (!) 22 20 (!) 23  During Hemodialysis Assessment  Blood Flow Rate (mL/min) 150 mL/min 150 mL/min 150 mL/min  Arterial Pressure (mmHg) -40 mmHg -40 mmHg -50 mmHg  Venous Pressure (mmHg) 20 mmHg 20 mmHg 20 mmHg  Transmembrane Pressure (mmHg) 40 mmHg 40 mmHg 40 mmHg  Ultrafiltration Rate (mL/min) 670 mL/min 670 mL/min 670 mL/min  Dialysate Flow Rate (mL/min) 300 ml/min 300 ml/min 300 ml/min  Conductivity: Machine  13.7 13.7 13.8  HD Safety Checks Performed Yes Yes Yes  Dialysis Fluid Bolus  --   --   --   Bolus Amount (mL)  --   --   --   Intra-Hemodialysis Comments Progressing as prescribed Progressing as prescribed Progressing as prescribed    10/27/21 1430  Vitals  BP (!) 158/93  MAP (mmHg) 111  Pulse Rate 91  ECG Heart Rate 89  Resp (!) 23  During Hemodialysis Assessment  Blood Flow Rate (mL/min)  --   Arterial Pressure (mmHg)  --   Venous Pressure (mmHg)  --   Transmembrane Pressure (mmHg)  --   Ultrafiltration  Rate (mL/min)  --   Dialysate Flow Rate (mL/min)  --   Conductivity: Machine   --   HD Safety Checks Performed  --   Dialysis Fluid Bolus Normal Saline  Bolus Amount (mL) 250 mL  Intra-Hemodialysis Comments Tx completed

## 2021-10-28 NOTE — Progress Notes (Signed)
PROGRESS NOTE    Joy Ray  NOB:[id number] DOB: 05-Oct-1966 DOA: 10/26/2021 PCP: Delsa Grana, PA-C  [id number]   Assessment & Plan:   Active Problems:   Acute respiratory failure with hypoxia ([place])   Joy Ray is a 55 y.o female with significant PMH of/as below who presented to the ED with chief complaints of progressive shortness of breath x2 days.   She checked her BP at home and noted it to be extremely elevated in the upper 947M systolic with associated mild to moderate headache.  Patient was advised by her PCP to go to the ED for further evaluation.  Patient called EMS today due to worsening symptoms associated with hypoxia and elevated blood pressure of 242/154 mm Hg. She received 10 mg of labetalol on route to the ED and was placed on NRB.   On arrival to the ED, she was hypothermic 94.2 (34.6) with blood pressure 214/125 mm Hg and pulse rate 52 beats/min, RR 28-32 breaths per minute., Sats 56% on NRB.  Stat chest x-ray was obtained and showed diffuse pulmonary edema concerning for flash pulmonary edema with her accelerated hypertension.  She was placed on BiPAP, started on nitro drip follow up by dose of IV Lasix, and nicardipine gtt.   Acute Hypoxic Respiratory Failure secondary to  Flash pulmonary edema  --BNP markedly elevated >45000pg/mL --pt received ceftriaxone and azithromycin x1 dose on presentation for presumed CAP, however, no strong evidence of PNA, so abx not continued. --already weaned down to RA this morning, after dialysis and BP control Plan: --iHD for volume removal   Hypertensive Emergency with Evidence of End Organ Dysfunction (Pulmonary Edema, AKI, Elevated troponin, headache) Severe elevations in blood pressure 214/125 mm Hg --off nitro gtt and nicardipine gtt this morning. Plan: --start amlodipine, coreg and losartan, per nephrology orders --if unable to take orals due to vomiting, will manage BP with PRN IV labetalol or PRN IV  hydralazine.   Current smoker  Elevated Troponin 2/2 demand ischemia --trop 180's flat.  Demand ischemia in the setting of severe HTN and pulm edema.   Severe Acute Kidney Injury or undiagnosed ESRD --renal US showed Echogenic kidneys compatible with medical renal disease. --started on dialysis Plan: --iHD per nephrology  High Anion Gap Metabolic Acidosis --2/2 kidney failure and severe uremia    Severe Anemia and thrombocytopenia --Hgb 6.1, plt 137 on presentation. --s/p 2u pRBC Plan: --monitor Hgb and transfuse for Hgb <7 --hematology consult, per nephro rec   DVT prophylaxis: SCD/Compression stockings Code Status: Full code  Family Communication:  Level of care: Telemetry Medical Dispo:   The patient is from: home Anticipated d/c is to: home Anticipated d/c date is: > 3 days Patient currently is not medically ready to d/c due to: newly ESRD, need PermCath placement   Subjective and Interval History:  RN reported pt having N/V, and vomited after taking pills.  Pt also complained of headache.     Objective: Vitals:   10/28/21 1515 10/28/21 1530 10/28/21 1545 10/28/21 1720  BP: (!) 145/86 (!) 158/81 (!) 154/78 (!) 157/79  Pulse: 75 83 82 83  Resp: 20 19 (!) 23 18  Temp:    98.1 F (36.7 C)  TempSrc:    Oral  SpO2: 95% 98% 96% 100%  Weight:      Height:        Intake/Output Summary (Last 24 hours) at 10/28/2021 1735 Last data filed at 10/28/2021 1530 Gross per 24 hour  Intake 730 ml  Output 650 ml  Net 80 ml   Filed Weights   10/27/21 1215 10/27/21 1438 10/28/21 0358  Weight: 53.5 kg 53.1 kg 61.5 kg    Examination:   Constitutional: NAD, AAOx3 HEENT: conjunctivae and lids normal, EOMI CV: No cyanosis.   RESP: normal respiratory effort, on RA Extremities: No effusions, edema in BLE SKIN: warm, dry Neuro: II - XII grossly intact.   Psych: subdued mood and affect.  Appropriate judgement and reason   Data Reviewed: I have personally reviewed  following labs and imaging studies  CBC: Recent Labs  Lab 10/26/21 1522 10/26/21 2148 10/26/21 2148 10/27/21 0233 10/27/21 0616 10/27/21 1041 10/27/21 1850 10/28/21 0000 10/28/21 0511 10/28/21 1125  WBC 9.4 11.6*  --  9.6 11.4*  --   --   --   --  12.6*  NEUTROABS 8.3* 10.7*  --  8.9*  --   --   --   --   --  11.6*  HGB 6.1* 6.0*   < > 5.5* 5.1* 7.2* 6.8* 6.2* 7.9* 8.0*  HCT 18.6* 18.1*   < > 15.9* 15.0* 21.4* 19.4* 17.7* 22.3* 23.2*  MCV 105* 104.0*  --  103.9* 96.2  --   --   --   --  90.6  PLT 137* 123*  --  89* 90*  --   --   --   --  114*   < > = values in this interval not displayed.   Basic Metabolic Panel: Recent Labs  Lab 10/26/21 2148 10/27/21 0233 10/27/21 0616 10/27/21 1253 10/28/21 0337 10/28/21 1125  NA 133* 133* 135  --  133* 134*  K 5.0 4.9 4.5  --  3.6 3.7  CL 99 100 96*  --  96* 96*  CO2 8* 10* 13*  --  18* 18*  GLUCOSE 236* 104* 92  --  104* 107*  BUN 143* 147* 166*  --  111* 99*  CREATININE 21.86* 22.60* 22.11*  --  16.05* 16.56*  CALCIUM 8.4* 7.9* 7.7*  --  7.4* 7.6*  MG  --  2.0 1.8  --  1.9  --   PHOS  --   --  12.1* 11.5* 8.7*  --    GFR: Estimated Creatinine Clearance: 3.8 mL/min (A) (by C-G formula based on SCr of 16.56 mg/dL (H)). Liver Function Tests: Recent Labs  Lab 10/26/21 2148 10/27/21 0233 10/28/21 0337 10/28/21 1125  AST 19 16  --  17  ALT 8 9  --  14  ALKPHOS 35* 34*  --  32*  BILITOT 0.9 0.9  --  0.9  PROT 5.8* 5.4*  --  5.3*  ALBUMIN 3.2* 2.7* 2.9* 2.8*   No results for input(s): LIPASE, AMYLASE in the last 168 hours. No results for input(s): AMMONIA in the last 168 hours. Coagulation Profile: Recent Labs  Lab 10/26/21 2148 10/28/21 1125  INR 1.2 1.1   Cardiac Enzymes: No results for input(s): CKTOTAL, CKMB, CKMBINDEX, TROPONINI in the last 168 hours. BNP (last 3 results) No results for input(s): PROBNP in the last 8760 hours. HbA1C: Recent Labs    10/26/21 1522  HGBA1C <4.2*   CBG: Recent Labs  Lab  10/27/21 0212 10/27/21 1956  GLUCAP 94 103*   Lipid Profile: Recent Labs    10/26/21 1522  CHOL 196  HDL 49  LDLCALC 121*  TRIG 148  CHOLHDL 4.0   Thyroid Function Tests: Recent Labs    10/27/21 0616  TSH 5.343*   Anemia Panel:  Recent Labs    10/27/21 0616 10/28/21 1125  VITAMINB12 208  --   FOLATE 7.0 4.9*  FERRITIN 387*  --   TIBC 262  --   IRON 32  --   RETICCTPCT 11.6* 6.0*   Sepsis Labs: Recent Labs  Lab 10/26/21 2148 10/27/21 0022  PROCALCITON  --  1.49  LATICACIDVEN 3.9* 1.9    Recent Results (from the past 240 hour(s))  Blood Culture (routine x 2)     Status: None (Preliminary result)   Collection Time: 10/26/21  9:48 PM   Specimen: BLOOD  Result Value Ref Range Status   Specimen Description BLOOD LEFT FOREARM  Final   Special Requests   Final    BOTTLES DRAWN AEROBIC AND ANAEROBIC Blood Culture adequate volume   Culture   Final    NO GROWTH 2 DAYS Performed at [facility], [address], [place], [place] [address]    Report Status PENDING  Incomplete  Resp Panel by RT-PCR (Flu A&B, Covid) Nasopharyngeal Swab     Status: None   Collection Time: 10/26/21  9:56 PM   Specimen: Nasopharyngeal Swab; Nasopharyngeal(NP) swabs in vial transport medium  Result Value Ref Range Status   SARS Coronavirus 2 by RT PCR NEGATIVE NEGATIVE Final    Comment: (NOTE) SARS-CoV-2 target nucleic acids are NOT DETECTED.  The SARS-CoV-2 RNA is generally detectable in upper respiratory specimens during the acute phase of infection. The lowest concentration of SARS-CoV-2 viral copies this assay can detect is 138 copies/mL. A negative result does not preclude SARS-Cov-2 infection and should not be used as the sole basis for treatment or other patient management decisions. A negative result may occur with  improper specimen collection/handling, submission of specimen other than nasopharyngeal swab, presence of viral mutation(s) within the areas targeted by  this assay, and inadequate number of viral copies(<138 copies/mL). A negative result must be combined with clinical observations, patient history, and epidemiological information. The expected result is Negative.  Fact Sheet for Patients:  EntrepreneurPulse.com.au  Fact Sheet for Healthcare Providers:  IncredibleEmployment.be  This test is no t yet approved or cleared by the [place] FDA and  has been authorized for detection and/or diagnosis of SARS-CoV-2 by FDA under an Emergency Use Authorization (EUA). This EUA will remain  in effect (meaning this test can be used) for the duration of the COVID-19 declaration under Section 564(b)(1) of the Act, 21 U.S.C.section 360bbb-3(b)(1), unless the authorization is terminated  or revoked sooner.       Influenza A by PCR NEGATIVE NEGATIVE Final   Influenza B by PCR NEGATIVE NEGATIVE Final    Comment: (NOTE) The Xpert Xpress SARS-CoV-2/FLU/RSV plus assay is intended as an aid in the diagnosis of influenza from Nasopharyngeal swab specimens and should not be used as a sole basis for treatment. Nasal washings and aspirates are unacceptable for Xpert Xpress SARS-CoV-2/FLU/RSV testing.  Fact Sheet for Patients: EntrepreneurPulse.com.au  Fact Sheet for Healthcare Providers: IncredibleEmployment.be  This test is not yet approved or cleared by the [place] FDA and has been authorized for detection and/or diagnosis of SARS-CoV-2 by FDA under an Emergency Use Authorization (EUA). This EUA will remain in effect (meaning this test can be used) for the duration of the COVID-19 declaration under Section 564(b)(1) of the Act, 21 U.S.C. section 360bbb-3(b)(1), unless the authorization is terminated or revoked.  Performed at [facility], [address] [place], [place] [address]   Blood Culture (routine x 2)  Status: Abnormal (Preliminary result)    Collection Time: 10/26/21  9:56 PM   Specimen: BLOOD  Result Value Ref Range Status   Specimen Description   Final    BLOOD RIGHT FOREARM Performed at [facility], [address] [place], [place] [address]    Special Requests   Final    BOTTLES DRAWN AEROBIC AND ANAEROBIC Blood Culture adequate volume Performed at [facility], [address] [place], [place] [address]    Culture  Setup Time   Final    GRAM POSITIVE COCCI Organism ID to follow ANAEROBIC BOTTLE ONLY CRITICAL RESULT CALLED TO, READ BACK BY AND VERIFIED WITH:    Culture (A)  Final    STAPHYLOCOCCUS EPIDERMIDIS THE SIGNIFICANCE OF ISOLATING THIS ORGANISM FROM A SINGLE SET OF BLOOD CULTURES WHEN MULTIPLE SETS ARE DRAWN IS UNCERTAIN. PLEASE NOTIFY THE MICROBIOLOGY DEPARTMENT WITHIN ONE WEEK IF SPECIATION AND SENSITIVITIES ARE REQUIRED. Performed at [facility], [place] [address], [place], [place] [address]    Report Status PENDING  Incomplete  Blood Culture ID Panel (Reflexed)     Status: Abnormal   Collection Time: 10/26/21  9:56 PM  Result Value Ref Range Status   Enterococcus faecalis NOT DETECTED NOT DETECTED Final   Enterococcus Faecium NOT DETECTED NOT DETECTED Final   Listeria monocytogenes NOT DETECTED NOT DETECTED Final   Staphylococcus species DETECTED (A) NOT DETECTED Final    Comment: CRITICAL RESULT CALLED TO, READ BACK BY AND VERIFIED WITH: SUSAN WATSON 10/27/21 1554 MU    Staphylococcus aureus (BCID) NOT DETECTED NOT DETECTED Final   Staphylococcus epidermidis DETECTED (A) NOT DETECTED Final    Comment: CRITICAL RESULT CALLED TO, READ BACK BY AND VERIFIED WITH: SUSAN WATSON 10/27/21 1554 MU    Staphylococcus lugdunensis NOT DETECTED NOT DETECTED Final   Streptococcus species NOT DETECTED NOT DETECTED Final   Streptococcus agalactiae NOT DETECTED NOT DETECTED Final   Streptococcus pneumoniae NOT DETECTED NOT DETECTED Final   Streptococcus pyogenes NOT DETECTED NOT DETECTED  Final   A.calcoaceticus-baumannii NOT DETECTED NOT DETECTED Final   Bacteroides fragilis NOT DETECTED NOT DETECTED Final   Enterobacterales NOT DETECTED NOT DETECTED Final   Enterobacter cloacae complex NOT DETECTED NOT DETECTED Final   Escherichia coli NOT DETECTED NOT DETECTED Final   Klebsiella aerogenes NOT DETECTED NOT DETECTED Final   Klebsiella oxytoca NOT DETECTED NOT DETECTED Final   Klebsiella pneumoniae NOT DETECTED NOT DETECTED Final   Proteus species NOT DETECTED NOT DETECTED Final   Salmonella species NOT DETECTED NOT DETECTED Final   Serratia marcescens NOT DETECTED NOT DETECTED Final   Haemophilus influenzae NOT DETECTED NOT DETECTED Final   Neisseria meningitidis NOT DETECTED NOT DETECTED Final   Pseudomonas aeruginosa NOT DETECTED NOT DETECTED Final   Stenotrophomonas maltophilia NOT DETECTED NOT DETECTED Final   Candida albicans NOT DETECTED NOT DETECTED Final   Candida auris NOT DETECTED NOT DETECTED Final   Candida glabrata NOT DETECTED NOT DETECTED Final   Candida krusei NOT DETECTED NOT DETECTED Final   Candida parapsilosis NOT DETECTED NOT DETECTED Final   Candida tropicalis NOT DETECTED NOT DETECTED Final   Cryptococcus neoformans/gattii NOT DETECTED NOT DETECTED Final   Methicillin resistance mecA/C NOT DETECTED NOT DETECTED Final    Comment: Performed at [facility], [address] [place], [place] [address]  MRSA Next Gen by PCR, Nasal     Status: None   Collection Time: 10/27/21  2:37 AM   Specimen: Nasal Mucosa; Nasal Swab  Result  Value Ref Range Status   MRSA by PCR Next Gen NOT DETECTED NOT DETECTED Final    Comment: (NOTE) The GeneXpert MRSA Assay (FDA approved for NASAL specimens only), is one component of a comprehensive MRSA colonization surveillance program. It is not intended to diagnose MRSA infection nor to guide or monitor treatment for MRSA infections. Test performance is not FDA approved in patients less than 55  years old. Performed at [facility], [address] [place], [place] [address]       Radiology Studies: US RENAL  Result Date: 10/27/2021 CLINICAL DATA:  Acute kidney injury. EXAM: RENAL / URINARY TRACT ULTRASOUND COMPLETE COMPARISON:  None. FINDINGS: Right Kidney: Renal measurements: 9.0 x 3.2 x 4.2 cm = volume: 63.9 mL. Increased parenchymal echogenicity. No mass or hydronephrosis visualized. Left Kidney: Renal measurements: 8.4 x 4.3 x 4.6 cm = volume: 88.7 mL. Increased parenchymal echogenicity. No mass or hydronephrosis visualized. Bladder: Appears normal for degree of bladder distention. Other: Incidental bilateral pleural effusions. IMPRESSION: 1. Echogenic kidneys compatible with medical renal disease. No hydronephrosis. 2. Bilateral pleural effusions. Electronically Signed   By: Logan Bores M.D.   On: 10/27/2021 07:48   DG Chest Port 1 View  Result Date: 10/27/2021 CLINICAL DATA:  Central line placement. EXAM: PORTABLE CHEST 1 VIEW COMPARISON:  October 26, 2021. FINDINGS: Interval placement of left internal jugular catheter with distal tip in expected position of the SVC. No pneumothorax is noted. IMPRESSION: Interval placement of left internal jugular catheter with distal tip in expected position of the SVC. Electronically Signed   By: Marijo Conception M.D.   On: 10/27/2021 09:38   DG Chest Portable 1 View  Result Date: 10/26/2021 CLINICAL DATA:  Shortness of breath and hypoxia EXAM: PORTABLE CHEST 1 VIEW COMPARISON:  Chest x-ray 10/26/2021 FINDINGS: The heart and mediastinal contours are unchanged. Aortic calcification. Marked interval worsening of diffuse interstitial and airspace opacities. No pulmonary edema. No pleural effusion. No pneumothorax. No acute osseous abnormality. IMPRESSION: Marked interval worsening of diffuse interstitial and airspace opacities. Findings suggestive of pulmonary edema. Superimposed infection/inflammation not excluded. Electronically Signed    By: Iven Finn M.D.   On: 10/26/2021 22:10   ECHOCARDIOGRAM COMPLETE  Result Date: 10/27/2021    ECHOCARDIOGRAM REPORT   Patient Name:   Joy Ray Date of Exam: 10/27/2021 Medical Rec #:  [id number]             Height:       68.0 in Accession #:    [id number]            Weight:       117.9 lb Date of Birth:  08/16/1966              BSA:          1.633 m Patient Age:    67 years              BP:           148/86 mmHg Patient Gender: F                     HR:           90 bpm. Exam Location:  [facility] Procedure: 2D Echo, Color Doppler and Cardiac Doppler Indications:     Elevated troponin  History:         Patient has no prior history of Echocardiogram examinations.  Risk Factors:Hypertension and Current Smoker.  Sonographer:     Charmayne Sheer Referring Phys:  [place] OUMA Diagnosing Phys: Kate Sable MD  Sonographer Comments: Technically difficult study due to poor echo windows. TDS due to small rib spaces. IMPRESSIONS  1. Left ventricular ejection fraction, by estimation, is 50 to 55%. The left ventricle has low normal function. The left ventricle has no regional wall motion abnormalities. There is mild left ventricular hypertrophy of the septal segment. Left ventricular diastolic parameters were normal.  2. Right ventricular systolic function is low normal. The right ventricular size is normal.  3. The mitral valve is normal in structure. No evidence of mitral valve regurgitation.  4. The aortic valve was not well visualized. Aortic valve regurgitation is not visualized.  5. The inferior vena cava is normal in size with <50% respiratory variability, suggesting right atrial pressure of 8 mmHg. FINDINGS  Left Ventricle: Left ventricular ejection fraction, by estimation, is 50 to 55%. The left ventricle has low normal function. The left ventricle has no regional wall motion abnormalities. The left ventricular internal cavity size was normal in size. There is mild left  ventricular hypertrophy of the septal segment. Left ventricular diastolic parameters were normal. Right Ventricle: The right ventricular size is normal. No increase in right ventricular wall thickness. Right ventricular systolic function is low normal. Left Atrium: Left atrial size was normal in size. Right Atrium: Right atrial size was normal in size. Pericardium: There is no evidence of pericardial effusion. Mitral Valve: The mitral valve is normal in structure. No evidence of mitral valve regurgitation. MV peak gradient, 3.9 mmHg. The mean mitral valve gradient is 2.0 mmHg. Tricuspid Valve: The tricuspid valve is not well visualized. Tricuspid valve regurgitation is trivial. Aortic Valve: The aortic valve was not well visualized. Aortic valve regurgitation is not visualized. Aortic valve mean gradient measures 6.0 mmHg. Aortic valve peak gradient measures 9.2 mmHg. Aortic valve area, by VTI measures 3.35 cm. Pulmonic Valve: The pulmonic valve was not well visualized. Pulmonic valve regurgitation is not visualized. Aorta: The aortic root is normal in size and structure. Venous: The inferior vena cava is normal in size with less than 50% respiratory variability, suggesting right atrial pressure of 8 mmHg. IAS/Shunts: No atrial level shunt detected by color flow Doppler.  LEFT VENTRICLE PLAX 2D LVIDd:         4.10 cm   Diastology LVIDs:         3.00 cm   LV e' medial:    6.85 cm/s LV PW:         1.10 cm   LV E/e' medial:  13.1 LV IVS:        1.00 cm   LV e' lateral:   6.96 cm/s LVOT diam:     2.20 cm   LV E/e' lateral: 12.9 LV SV:         85 LV SV Index:   52 LVOT Area:     3.80 cm  RIGHT VENTRICLE RV Basal diam:  3.20 cm LEFT ATRIUM             Index        RIGHT ATRIUM           Index LA diam:        3.80 cm 2.33 cm/m   RA Area:     12.00 cm LA Vol (A2C):   45.8 ml 28.05 ml/m  RA Volume:   27.50 ml  16.84 ml/m LA Vol (  A4C):   33.1 ml 20.27 ml/m LA Biplane Vol: 40.8 ml 24.99 ml/m  AORTIC VALVE AV Area  (Vmax):    3.28 cm AV Area (Vmean):   2.86 cm AV Area (VTI):     3.35 cm AV Vmax:           152.00 cm/s AV Vmean:          120.000 cm/s AV VTI:            0.254 m AV Peak Grad:      9.2 mmHg AV Mean Grad:      6.0 mmHg LVOT Vmax:         131.00 cm/s LVOT Vmean:        90.200 cm/s LVOT VTI:          0.224 m LVOT/AV VTI ratio: 0.88  AORTA Ao Root diam: 3.40 cm MITRAL VALVE MV Area (PHT): 4.04 cm    SHUNTS MV Area VTI:   3.35 cm    Systemic VTI:  0.22 m MV Peak grad:  3.9 mmHg    Systemic Diam: 2.20 cm MV Mean grad:  2.0 mmHg MV Vmax:       0.98 m/s MV Vmean:      66.4 cm/s MV Decel Time: 188 msec MV E velocity: 90.00 cm/s MV A velocity: 75.40 cm/s MV E/A ratio:  1.19 Kate Sable MD Electronically signed by Kate Sable MD Signature Date/Time: 10/27/2021/2:47:40 PM    Final      Scheduled Meds:  sodium chloride   Intravenous Once   sodium chloride   Intravenous Once   amLODipine  10 mg Oral Daily   budesonide (PULMICORT) nebulizer solution  0.25 mg Nebulization BID   butalbital-acetaminophen-caffeine  2 tablet Oral Once   carvedilol  6.25 mg Oral BID WC   Chlorhexidine Gluconate Cloth  6 each Topical Q0600   Chlorhexidine Gluconate Cloth  6 each Topical Q0600   feeding supplement (NEPRO CARB STEADY)  237 mL Oral BID BM   losartan  100 mg Oral Daily   melatonin  5 mg Oral QHS   multivitamin  1 tablet Oral QHS   Continuous Infusions:  promethazine (PHENERGAN) injection (IM or IVPB)       LOS: 2 days     Enzo Bi, MD [facility] If 7PM-7AM, please contact night-coverage 10/28/2021, 5:35 PM

## 2021-10-28 NOTE — Progress Notes (Signed)
Attempted to give patient her oral blood pressure meds. She vomited and pills came back up. Messaged prime doc. Continue to assess.

## 2021-10-28 NOTE — Progress Notes (Signed)
[facility]  ROUNDING NOTE   Subjective:  Patient underwent urgent dialysis treatment yesterday. She tolerated the procedure well. She is now extubated and awake, alert, and conversant. We a long conversation today about renal failure. Her EGFR was only 2. Since we were able to interview her today she states that she has been having significant dysphagia as well as fatigue and headaches. Hemoglobin was also quite low at 5.1. Unfortunately signs point to ESRD. We had a long discussion about ongoing renal placement therapy. It appears that at the moment she is favoring peritoneal dialysis that she would like to continue to work.   Objective:  Vital signs in last 24 hours:  Temp:  [98.1 F (36.7 C)-99.1 F (37.3 C)] 98.1 F (36.7 C) (11/10 0700) Pulse Rate:  [73-101] 73 (11/10 0821) Resp:  [16-29] 24 (11/10 0841) BP: (131-201)/(78-124) 165/96 (11/10 0841) SpO2:  [90 %-100 %] 100 % (11/10 0821) Arterial Line BP: (145-216)/(65-107) 178/92 (11/10 0821) Weight:  [53.1 kg-61.5 kg] 61.5 kg (11/10 0358)  Weight change: 0 kg Filed Weights   10/27/21 1215 10/27/21 1438 10/28/21 0358  Weight: 53.5 kg 53.1 kg 61.5 kg    Intake/Output: I/O last 3 completed shifts: In: 3074.2 [P.O.:980; I.V.:564.2; Blood:1180; IV Piggyback:350] Out: 600 [Urine:150; Other:450]   Intake/Output this shift:  No intake/output data recorded.  Physical Exam: General: No acute distress  Head: Normocephalic, atraumatic. Moist oral mucosal membranes  Eyes: Anicteric  Neck: Supple  Lungs:  Clear to auscultation, normal effort  Heart: S1S2 no rubs  Abdomen:  Soft, nontender, bowel sounds present  Extremities: Trace peripheral edema.  Neurologic: Awake, alert, following commands  Skin: No acute rash  Access: IJ temporary dialysis catheter    Basic Metabolic Panel: Recent Labs  Lab 10/26/21 2148 10/27/21 0233 10/27/21 0616 10/27/21 1253 10/28/21 0337  NA 133* 133* 135  --  133*  K  5.0 4.9 4.5  --  3.6  CL 99 100 96*  --  96*  CO2 8* 10* 13*  --  18*  GLUCOSE 236* 104* 92  --  104*  BUN 143* 147* 166*  --  111*  CREATININE 21.86* 22.60* 22.11*  --  16.05*  CALCIUM 8.4* 7.9* 7.7*  --  7.4*  MG  --  2.0 1.8  --  1.9  PHOS  --   --  12.1* 11.5* 8.7*    Liver Function Tests: Recent Labs  Lab 10/26/21 2148 10/27/21 0233 10/28/21 0337  AST 19 16  --   ALT 8 9  --   ALKPHOS 35* 34*  --   BILITOT 0.9 0.9  --   PROT 5.8* 5.4*  --   ALBUMIN 3.2* 2.7* 2.9*   No results for input(s): LIPASE, AMYLASE in the last 168 hours. No results for input(s): AMMONIA in the last 168 hours.  CBC: Recent Labs  Lab 10/26/21 1522 10/26/21 2148 10/26/21 2148 10/27/21 0233 10/27/21 0616 10/27/21 1041 10/27/21 1850 10/28/21 0000 10/28/21 0511  WBC 9.4 11.6*  --  9.6 11.4*  --   --   --   --   NEUTROABS 8.3* 10.7*  --  8.9*  --   --   --   --   --   HGB 6.1* 6.0*   < > 5.5* 5.1* 7.2* 6.8* 6.2* 7.9*  HCT 18.6* 18.1*   < > 15.9* 15.0* 21.4* 19.4* 17.7* 22.3*  MCV 105* 104.0*  --  103.9* 96.2  --   --   --   --  PLT 137* 123*  --  89* 90*  --   --   --   --    < > = values in this interval not displayed.    Cardiac Enzymes: No results for input(s): CKTOTAL, CKMB, CKMBINDEX, TROPONINI in the last 168 hours.  BNP: Invalid input(s): POCBNP  CBG: Recent Labs  Lab 10/27/21 0212 10/27/21 1956  GLUCAP 41 103*    Microbiology: Results for orders placed or performed during the hospital encounter of 10/26/21  Blood Culture (routine x 2)     Status: None (Preliminary result)   Collection Time: 10/26/21  9:48 PM   Specimen: BLOOD  Result Value Ref Range Status   Specimen Description BLOOD LEFT FOREARM  Final   Special Requests   Final    BOTTLES DRAWN AEROBIC AND ANAEROBIC Blood Culture adequate volume   Culture   Final    NO GROWTH 2 DAYS Performed at [facility], [address], [place],  [address]    Report Status PENDING  Incomplete  Resp  Panel by RT-PCR (Flu A&B, Covid) Nasopharyngeal Swab     Status: None   Collection Time: 10/26/21  9:56 PM   Specimen: Nasopharyngeal Swab; Nasopharyngeal(NP) swabs in vial transport medium  Result Value Ref Range Status   SARS Coronavirus 2 by RT PCR NEGATIVE NEGATIVE Final    Comment: (NOTE) SARS-CoV-2 target nucleic acids are NOT DETECTED.  The SARS-CoV-2 RNA is generally detectable in upper respiratory specimens during the acute phase of infection. The lowest concentration of SARS-CoV-2 viral copies this assay can detect is 138 copies/mL. A negative result does not preclude SARS-Cov-2 infection and should not be used as the sole basis for treatment or other patient management decisions. A negative result may occur with  improper specimen collection/handling, submission of specimen other than nasopharyngeal swab, presence of viral mutation(s) within the areas targeted by this assay, and inadequate number of viral copies(<138 copies/mL). A negative result must be combined with clinical observations, patient history, and epidemiological information. The expected result is Negative.  Fact Sheet for Patients:  EntrepreneurPulse.com.au  Fact Sheet for Healthcare Providers:  IncredibleEmployment.be  This test is no t yet approved or cleared by the [place] FDA and  has been authorized for detection and/or diagnosis of SARS-CoV-2 by FDA under an Emergency Use Authorization (EUA). This EUA will remain  in effect (meaning this test can be used) for the duration of the COVID-19 declaration under Section 564(b)(1) of the Act, 21 U.S.C.section 360bbb-3(b)(1), unless the authorization is terminated  or revoked sooner.       Influenza A by PCR NEGATIVE NEGATIVE Final   Influenza B by PCR NEGATIVE NEGATIVE Final    Comment: (NOTE) The Xpert Xpress SARS-CoV-2/FLU/RSV plus assay is intended as an aid in the diagnosis of influenza from Nasopharyngeal  swab specimens and should not be used as a sole basis for treatment. Nasal washings and aspirates are unacceptable for Xpert Xpress SARS-CoV-2/FLU/RSV testing.  Fact Sheet for Patients: EntrepreneurPulse.com.au  Fact Sheet for Healthcare Providers: IncredibleEmployment.be  This test is not yet approved or cleared by the [place] FDA and has been authorized for detection and/or diagnosis of SARS-CoV-2 by FDA under an Emergency Use Authorization (EUA). This EUA will remain in effect (meaning this test can be used) for the duration of the COVID-19 declaration under Section 564(b)(1) of the Act, 21 U.S.C. section 360bbb-3(b)(1), unless the authorization is terminated or revoked.  Performed at [facility], [address] [place],  [place] [address]   Blood Culture (routine x 2)     Status: None (Preliminary result)   Collection Time: 10/26/21  9:56 PM   Specimen: BLOOD  Result Value Ref Range Status   Specimen Description   Final    BLOOD RIGHT FOREARM Performed at [facility], [address] [place], [place] [address]    Special Requests   Final    BOTTLES DRAWN AEROBIC AND ANAEROBIC Blood Culture adequate volume Performed at [facility], [address] [place], [place] [address]    Culture  Setup Time   Final    GRAM POSITIVE COCCI Organism ID to follow ANAEROBIC BOTTLE ONLY CRITICAL RESULT CALLED TO, READ BACK BY AND VERIFIED WITH:    Culture GRAM POSITIVE COCCI  Final   Report Status PENDING  Incomplete  Blood Culture ID Panel (Reflexed)     Status: Abnormal   Collection Time: 10/26/21  9:56 PM  Result Value Ref Range Status   Enterococcus faecalis NOT DETECTED NOT DETECTED Final   Enterococcus Faecium NOT DETECTED NOT DETECTED Final   Listeria monocytogenes NOT DETECTED NOT DETECTED Final   Staphylococcus species DETECTED (A) NOT DETECTED Final    Comment: CRITICAL RESULT CALLED TO, READ BACK  BY AND VERIFIED WITH: SUSAN WATSON 10/27/21 1554 MU    Staphylococcus aureus (BCID) NOT DETECTED NOT DETECTED Final   Staphylococcus epidermidis DETECTED (A) NOT DETECTED Final    Comment: CRITICAL RESULT CALLED TO, READ BACK BY AND VERIFIED WITH: SUSAN WATSON 10/27/21 1554 MU    Staphylococcus lugdunensis NOT DETECTED NOT DETECTED Final   Streptococcus species NOT DETECTED NOT DETECTED Final   Streptococcus agalactiae NOT DETECTED NOT DETECTED Final   Streptococcus pneumoniae NOT DETECTED NOT DETECTED Final   Streptococcus pyogenes NOT DETECTED NOT DETECTED Final   A.calcoaceticus-baumannii NOT DETECTED NOT DETECTED Final   Bacteroides fragilis NOT DETECTED NOT DETECTED Final   Enterobacterales NOT DETECTED NOT DETECTED Final   Enterobacter cloacae complex NOT DETECTED NOT DETECTED Final   Escherichia coli NOT DETECTED NOT DETECTED Final   Klebsiella aerogenes NOT DETECTED NOT DETECTED Final   Klebsiella oxytoca NOT DETECTED NOT DETECTED Final   Klebsiella pneumoniae NOT DETECTED NOT DETECTED Final   Proteus species NOT DETECTED NOT DETECTED Final   Salmonella species NOT DETECTED NOT DETECTED Final   Serratia marcescens NOT DETECTED NOT DETECTED Final   Haemophilus influenzae NOT DETECTED NOT DETECTED Final   Neisseria meningitidis NOT DETECTED NOT DETECTED Final   Pseudomonas aeruginosa NOT DETECTED NOT DETECTED Final   Stenotrophomonas maltophilia NOT DETECTED NOT DETECTED Final   Candida albicans NOT DETECTED NOT DETECTED Final   Candida auris NOT DETECTED NOT DETECTED Final   Candida glabrata NOT DETECTED NOT DETECTED Final   Candida krusei NOT DETECTED NOT DETECTED Final   Candida parapsilosis NOT DETECTED NOT DETECTED Final   Candida tropicalis NOT DETECTED NOT DETECTED Final   Cryptococcus neoformans/gattii NOT DETECTED NOT DETECTED Final   Methicillin resistance mecA/C NOT DETECTED NOT DETECTED Final    Comment: Performed at [facility], [address]  [place], [place] [address]  MRSA Next Gen by PCR, Nasal     Status: None   Collection Time: 10/27/21  2:37 AM   Specimen: Nasal Mucosa; Nasal Swab  Result Value Ref Range Status   MRSA by PCR Next Gen NOT DETECTED NOT DETECTED Final    Comment: (NOTE) The GeneXpert MRSA Assay (FDA approved for NASAL specimens only), is one component of a comprehensive MRSA  colonization surveillance program. It is not intended to diagnose MRSA infection nor to guide or monitor treatment for MRSA infections. Test performance is not FDA approved in patients less than 72 years old. Performed at [facility], [address] [place], [place] [address]     Coagulation Studies: Recent Labs    10/26/21 2148  LABPROT 15.0  INR 1.2    Urinalysis: No results for input(s): COLORURINE, LABSPEC, PHURINE, GLUCOSEU, HGBUR, BILIRUBINUR, KETONESUR, PROTEINUR, UROBILINOGEN, NITRITE, LEUKOCYTESUR in the last 72 hours.  Invalid input(s): APPERANCEUR    Imaging: DG Chest 2 View  Result Date: 10/27/2021 CLINICAL DATA:  Dyspnea. EXAM: CHEST - 2 VIEW COMPARISON:  None. FINDINGS: The heart size and mediastinal contours are within normal limits. Bibasilar opacities are noted concerning for edema or scarring. Focal airspace opacity is noted in left lower lobe concerning for pneumonia. The visualized skeletal structures are unremarkable. IMPRESSION: Bibasilar opacities are noted concerning for edema or scarring. Focal airspace opacity is noted in left lower lobe concerning for pneumonia. Followup PA and lateral chest X-ray is recommended in 3-4 weeks following trial of antibiotic therapy to ensure resolution and exclude underlying malignancy. Electronically Signed   By: Marijo Conception M.D.   On: 10/27/2021 12:55   US RENAL  Result Date: 10/27/2021 CLINICAL DATA:  Acute kidney injury. EXAM: RENAL / URINARY TRACT ULTRASOUND COMPLETE COMPARISON:  None. FINDINGS: Right Kidney: Renal measurements: 9.0 x 3.2 x 4.2 cm =  volume: 63.9 mL. Increased parenchymal echogenicity. No mass or hydronephrosis visualized. Left Kidney: Renal measurements: 8.4 x 4.3 x 4.6 cm = volume: 88.7 mL. Increased parenchymal echogenicity. No mass or hydronephrosis visualized. Bladder: Appears normal for degree of bladder distention. Other: Incidental bilateral pleural effusions. IMPRESSION: 1. Echogenic kidneys compatible with medical renal disease. No hydronephrosis. 2. Bilateral pleural effusions. Electronically Signed   By: Logan Bores M.D.   On: 10/27/2021 07:48   DG Chest Port 1 View  Result Date: 10/27/2021 CLINICAL DATA:  Central line placement. EXAM: PORTABLE CHEST 1 VIEW COMPARISON:  October 26, 2021. FINDINGS: Interval placement of left internal jugular catheter with distal tip in expected position of the SVC. No pneumothorax is noted. IMPRESSION: Interval placement of left internal jugular catheter with distal tip in expected position of the SVC. Electronically Signed   By: Marijo Conception M.D.   On: 10/27/2021 09:38   DG Chest Portable 1 View  Result Date: 10/26/2021 CLINICAL DATA:  Shortness of breath and hypoxia EXAM: PORTABLE CHEST 1 VIEW COMPARISON:  Chest x-ray 10/26/2021 FINDINGS: The heart and mediastinal contours are unchanged. Aortic calcification. Marked interval worsening of diffuse interstitial and airspace opacities. No pulmonary edema. No pleural effusion. No pneumothorax. No acute osseous abnormality. IMPRESSION: Marked interval worsening of diffuse interstitial and airspace opacities. Findings suggestive of pulmonary edema. Superimposed infection/inflammation not excluded. Electronically Signed   By: Iven Finn M.D.   On: 10/26/2021 22:10   ECHOCARDIOGRAM COMPLETE  Result Date: 10/27/2021    ECHOCARDIOGRAM REPORT   Patient Name:   Joy Ray Date of Exam: 10/27/2021 Medical Rec #:  [id number]             Height:       68.0 in Accession #:    [id number]            Weight:       117.9 lb Date of Birth:   29-Nov-1966              BSA:  1.633 m Patient Age:    55 years              BP:           148/86 mmHg Patient Gender: F                     HR:           90 bpm. Exam Location:  [facility] Procedure: 2D Echo, Color Doppler and Cardiac Doppler Indications:     Elevated troponin  History:         Patient has no prior history of Echocardiogram examinations.                  Risk Factors:Hypertension and Current Smoker.  Sonographer:     Charmayne Sheer Referring Phys:  Sterling OUMA Diagnosing Phys: Kate Sable MD  Sonographer Comments: Technically difficult study due to poor echo windows. TDS due to small rib spaces. IMPRESSIONS  1. Left ventricular ejection fraction, by estimation, is 50 to 55%. The left ventricle has low normal function. The left ventricle has no regional wall motion abnormalities. There is mild left ventricular hypertrophy of the septal segment. Left ventricular diastolic parameters were normal.  2. Right ventricular systolic function is low normal. The right ventricular size is normal.  3. The mitral valve is normal in structure. No evidence of mitral valve regurgitation.  4. The aortic valve was not well visualized. Aortic valve regurgitation is not visualized.  5. The inferior vena cava is normal in size with <50% respiratory variability, suggesting right atrial pressure of 8 mmHg. FINDINGS  Left Ventricle: Left ventricular ejection fraction, by estimation, is 50 to 55%. The left ventricle has low normal function. The left ventricle has no regional wall motion abnormalities. The left ventricular internal cavity size was normal in size. There is mild left ventricular hypertrophy of the septal segment. Left ventricular diastolic parameters were normal. Right Ventricle: The right ventricular size is normal. No increase in right ventricular wall thickness. Right ventricular systolic function is low normal. Left Atrium: Left atrial size was normal in size. Right Atrium: Right atrial  size was normal in size. Pericardium: There is no evidence of pericardial effusion. Mitral Valve: The mitral valve is normal in structure. No evidence of mitral valve regurgitation. MV peak gradient, 3.9 mmHg. The mean mitral valve gradient is 2.0 mmHg. Tricuspid Valve: The tricuspid valve is not well visualized. Tricuspid valve regurgitation is trivial. Aortic Valve: The aortic valve was not well visualized. Aortic valve regurgitation is not visualized. Aortic valve mean gradient measures 6.0 mmHg. Aortic valve peak gradient measures 9.2 mmHg. Aortic valve area, by VTI measures 3.35 cm. Pulmonic Valve: The pulmonic valve was not well visualized. Pulmonic valve regurgitation is not visualized. Aorta: The aortic root is normal in size and structure. Venous: The inferior vena cava is normal in size with less than 50% respiratory variability, suggesting right atrial pressure of 8 mmHg. IAS/Shunts: No atrial level shunt detected by color flow Doppler.  LEFT VENTRICLE PLAX 2D LVIDd:         4.10 cm   Diastology LVIDs:         3.00 cm   LV e' medial:    6.85 cm/s LV PW:         1.10 cm   LV E/e' medial:  13.1 LV IVS:        1.00 cm   LV e' lateral:   6.96 cm/s LVOT diam:  2.20 cm   LV E/e' lateral: 12.9 LV SV:         85 LV SV Index:   52 LVOT Area:     3.80 cm  RIGHT VENTRICLE RV Basal diam:  3.20 cm LEFT ATRIUM             Index        RIGHT ATRIUM           Index LA diam:        3.80 cm 2.33 cm/m   RA Area:     12.00 cm LA Vol (A2C):   45.8 ml 28.05 ml/m  RA Volume:   27.50 ml  16.84 ml/m LA Vol (A4C):   33.1 ml 20.27 ml/m LA Biplane Vol: 40.8 ml 24.99 ml/m  AORTIC VALVE AV Area (Vmax):    3.28 cm AV Area (Vmean):   2.86 cm AV Area (VTI):     3.35 cm AV Vmax:           152.00 cm/s AV Vmean:          120.000 cm/s AV VTI:            0.254 m AV Peak Grad:      9.2 mmHg AV Mean Grad:      6.0 mmHg LVOT Vmax:         131.00 cm/s LVOT Vmean:        90.200 cm/s LVOT VTI:          0.224 m LVOT/AV VTI ratio: 0.88   AORTA Ao Root diam: 3.40 cm MITRAL VALVE MV Area (PHT): 4.04 cm    SHUNTS MV Area VTI:   3.35 cm    Systemic VTI:  0.22 m MV Peak grad:  3.9 mmHg    Systemic Diam: 2.20 cm MV Mean grad:  2.0 mmHg MV Vmax:       0.98 m/s MV Vmean:      66.4 cm/s MV Decel Time: 188 msec MV E velocity: 90.00 cm/s MV A velocity: 75.40 cm/s MV E/A ratio:  1.19 Kate Sable MD Electronically signed by Kate Sable MD Signature Date/Time: 10/27/2021/2:47:40 PM    Final      Medications:    sodium chloride     sodium chloride     niCARDipine Stopped (10/27/21 0958)   nitroGLYCERIN 100 mcg/min (10/27/21 0043)   sodium bicarbonate 150 mEq in D5W infusion Stopped (10/27/21 1521)    sodium chloride   Intravenous Once   sodium chloride   Intravenous Once   budesonide (PULMICORT) nebulizer solution  0.25 mg Nebulization BID   Chlorhexidine Gluconate Cloth  6 each Topical Q0600   Chlorhexidine Gluconate Cloth  6 each Topical Q0600   melatonin  5 mg Oral QHS   sodium chloride, sodium chloride, alteplase, docusate sodium, fentaNYL (SUBLIMAZE) injection, heparin, hydrALAZINE, ipratropium-albuterol, labetalol, lidocaine (PF), lidocaine-prilocaine, ondansetron (ZOFRAN) IV, pentafluoroprop-tetrafluoroeth, polyethylene glycol  Assessment/ Plan:  55 y.o. female with a PMHx of history with history of hypertensive urgency, tobacco abuse, allergic rhinitis, history of hemorrhoids, who was admitted to [facility] on 10/26/2021 for evaluation of progressive shortness of breath.   1.  End-stage renal disease.  Presenting creatinine was very high at 21.86.  Baseline creatinine 0.96 on 08/30/2018.  We have significant concern that this may potentially represent ESRD as patient found to have atrophic and echogenic kidneys.  Patient also significantly anemic upon presentation.  Suspect renal insufficiency now secondary to prolonged malignant hypertension.  Patient also with dyscrasia at  home. -We will plan for a second hemodialysis  treatment today.  She tolerated her first treatment well.  We had a long discussion about renal placement therapy as it appears that she has ESRD.  It appears that she would like to continue to work and therefore would like to have home therapy in the form of peritoneal dialysis.  I have discussed care of the patient with Dr. Dahlia Byes who we will plan for PD catheter placement on Monday.  In the meantime we can continue with hemodialysis for now.  2.  Acute respiratory failure.  Secondary to malignant hypertension.  Now off the ventilator.  3.  Malignant hypertension.  Wean off nicardipine drip.  We will now add losartan, amlodipine, and carvedilol to her medication regimen.  4.  Anemia/thrombocytopenia.  We suspect that the patient's anemia was secondary to underlying kidney disease plus some element of hemolysis from malignant hypertension.  Thrombocytopenia likely secondary to malignant hypertension as well.  Hemoglobin up to 7.9 posttransfusion.    LOS: 2 Sahmir Weatherbee 11/10/20229:34 AM

## 2021-10-28 NOTE — Consult Note (Signed)
Hematology/Oncology Consult Note [facility]  [phone] [phone]    Patient Care Team: Delsa Grana, Hershal Coria as PCP - General (Family Medicine)   Name of the patient: Joy Ray  [id number]  1966/01/23   Date of visit: 10/28/2021  History of Presenting Illness- Patient is 55 year old female with PMHx of hypertensive urgency, tobacco abuse, allergic rhinitis, and history of hemorrhoids, currently admitted to [facility] on 10/26/21 after developing progressive shortness of breath and feeling stressed which prompted her to check her blood pressure. Pressure was 242/154. She was treated in labetalol. Presenting creatinine in ER was 21.86. Platelets decreased to 90. Anemic with hemoglobin 5.1. She has started dialysis. Hematology consulted for anemia and thrombocytopenia. Patient has received dialysis. Feels generally weak and tired. Has received blood transfusion.  Per patient, she has a past medical history of anemia thought to be secondary to [facility] acid deficiency.  No bleeding disorders.  No family history or personal history of cancer.  She lives at home with her son.  Works at [employer].  Denies exposure to toxic substances.  ECOG PS- 2  Review of Systems  Constitutional:  Positive for malaise/fatigue. Negative for chills, fever and weight loss.  HENT:  Negative for hearing loss, nosebleeds, sore throat and tinnitus.   Eyes:  Negative for blurred vision and double vision.  Respiratory:  Negative for cough, hemoptysis, shortness of breath and wheezing.   Cardiovascular:  Negative for chest pain, palpitations and leg swelling.  Gastrointestinal:  Negative for abdominal pain, blood in stool, constipation, diarrhea, melena, nausea and vomiting.  Genitourinary:  Negative for dysuria and urgency.  Musculoskeletal:  Positive for back pain. Negative for falls, joint pain and myalgias.  Skin:  Negative for itching and rash.  Neurological:   Negative for dizziness, tingling, sensory change, loss of consciousness, weakness and headaches.  Endo/Heme/Allergies:  Negative for environmental allergies. Does not bruise/bleed easily.  Psychiatric/Behavioral:  Negative for depression. The patient is not nervous/anxious and does not have insomnia.    Allergies  Allergen Reactions   Ciprofloxacin     Pt denies    Patient Active Problem List   Diagnosis Date Noted   Acute respiratory failure with hypoxia ([place]) 10/26/2021   Asymptomatic hypertensive urgency 11/12/2019   History of smoking 10-25 pack years 04/18/2018   Colon cancer screening 04/05/2016   Allergic rhinitis 10/13/2015   H/O abnormal cervical Papanicolaou smear 10/13/2015   Hypertension goal BP (blood pressure) < 140/90 10/13/2015     Past Medical History:  Diagnosis Date   Allergy    Cigarette smoker    HTN, goal below 140/90    Second degree hemorrhoids      Past Surgical History:  Procedure Laterality Date   COLONOSCOPY WITH PROPOFOL N/A 04/21/2016   Procedure: COLONOSCOPY WITH PROPOFOL;  Surgeon: Lucilla Lame, MD;  Location: [place];  Service: Endoscopy;  Laterality: N/A;   TUBAL LIGATION  1994   After G1P1001    Social History   Socioeconomic History   Marital status: Single    Spouse name: Not on file   Number of children: 1   Years of education: Not on file   Highest education level: Not on file  Occupational History   Not on file  Tobacco Use   Smoking status: Light Smoker    Packs/day: 0.50    Years: 20.00    Pack years: 10.00    Types: Cigarettes    Last attempt to quit: 04/21/2016  Years since quitting: 5.5   Smokeless tobacco: Never   Tobacco comments:    pt has cut back to 1 or 2 cigs/day  Vaping Use   Vaping Use: Never used  Substance and Sexual Activity   Alcohol use: Yes    Alcohol/week: 1.0 standard drink    Types: 1 Glasses of wine per week    Comment:     Drug use: No   Sexual activity: Not Currently  Other  Topics Concern   Not on file  Social History Narrative   Not on file   Social Determinants of Health   Financial Resource Strain: Low Risk    Difficulty of Paying Living Expenses: Not hard at all  Food Insecurity: No Food Insecurity   Worried About Charity fundraiser in the Last Year: Never true   [place] in the Last Year: Never true  Transportation Needs: No Transportation Needs   Lack of Transportation (Medical): No   Lack of Transportation (Non-Medical): No  Physical Activity: Insufficiently Active   Days of Exercise per Week: 3 days   Minutes of Exercise per Session: 20 min  Stress: No Stress Concern Present   Feeling of Stress : Only a little  Social Connections: Socially Isolated   Frequency of Communication with Friends and Family: More than three times a week   Frequency of Social Gatherings with Friends and Family: More than three times a week   Attends Religious Services: Never   Marine scientist or Organizations: No   Attends Music therapist: Never   Marital Status: Never married  Human resources officer Violence: Not At Risk   Fear of Current or Ex-Partner: No   Emotionally Abused: No   Physically Abused: No   Sexually Abused: No    Family History  Problem Relation Age of Onset   Cancer Mother        breast   Breast cancer Mother 49   Breast cancer Maternal Aunt      Current Facility-Administered Medications:    0.9 %  sodium chloride infusion (Manually program via Guardrails IV Fluids), , Intravenous, Once, [place], [employer], NP   0.9 %  sodium chloride infusion (Manually program via [place]), , Intravenous, Once, [place], Bing Neighbors, NP, Held at 10/28/21 0128   0.9 %  sodium chloride infusion, 100 mL, Intravenous, PRN, Lateef, Munsoor, MD   0.9 %  sodium chloride infusion, 100 mL, Intravenous, PRN, Lateef, Munsoor, MD   alteplase (CATHFLO ACTIVASE) injection 2 mg, 2 mg, Intracatheter, Once PRN, Lateef, Munsoor,  MD   amLODipine (NORVASC) tablet 10 mg, 10 mg, Oral, Daily, Lateef, Munsoor, MD   budesonide (PULMICORT) nebulizer solution 0.25 mg, 0.25 mg, Nebulization, BID, [place], Bing Neighbors, NP, 0.25 mg at 10/28/21 0740   carvedilol (COREG) tablet 6.25 mg, 6.25 mg, Oral, BID WC, Lateef, Munsoor, MD   Chlorhexidine Gluconate Cloth 2 % PADS 6 each, 6 each, Topical, Q0600, Tyler Pita, MD, 6 each at 10/28/21 0510   Chlorhexidine Gluconate Cloth 2 % PADS 6 each, 6 each, Topical, Q0600, Lateef, Munsoor, MD   docusate sodium (COLACE) capsule 100 mg, 100 mg, Oral, BID PRN, Lang Snow, NP   feeding supplement (NEPRO CARB STEADY) liquid 237 mL, 237 mL, Oral, BID BM, Enzo Bi, MD   fentaNYL (SUBLIMAZE) injection 50 mcg, 50 mcg, Intravenous, Q4H PRN, Lang Snow, NP, 50 mcg at 10/28/21 0342   heparin injection 1,000 Units, 1,000 Units, Dialysis, PRN,  Anthonette Legato, MD, 1,000 Units at 10/27/21 1410   hydrALAZINE (APRESOLINE) injection 10 mg, 10 mg, Intravenous, Q4H PRN, Lang Snow, NP, 10 mg at 10/28/21 0809   ipratropium-albuterol (DUONEB) 0.5-2.5 (3) MG/3ML nebulizer solution 3 mL, 3 mL, Nebulization, Q4H PRN, [place], Bing Neighbors, NP   labetalol (NORMODYNE) injection 10 mg, 10 mg, Intravenous, Q2H PRN, Lang Snow, NP, 10 mg at 10/28/21 0732   lidocaine (PF) (XYLOCAINE) 1 % injection 5 mL, 5 mL, Intradermal, PRN, Lateef, Munsoor, MD   lidocaine-prilocaine (EMLA) cream 1 application, 1 application, Topical, PRN, Lateef, Munsoor, MD   losartan (COZAAR) tablet 100 mg, 100 mg, Oral, Daily, Lateef, Munsoor, MD   melatonin tablet 5 mg, 5 mg, Oral, QHS, [place], Bing Neighbors, NP, 5 mg at 10/27/21 2236   multivitamin (RENA-VIT) tablet 1 tablet, 1 tablet, Oral, QHS, Enzo Bi, MD   ondansetron [facility]) injection 4 mg, 4 mg, Intravenous, Q8H PRN, Flora Lipps, MD, 4 mg at 10/28/21 0839   pentafluoroprop-tetrafluoroeth (GEBAUERS) aerosol 1 application, 1  application, Topical, PRN, Lateef, Munsoor, MD   polyethylene glycol (MIRALAX / GLYCOLAX) packet 17 g, 17 g, Oral, Daily PRN, Lang Snow, NP   promethazine (PHENERGAN) 12.5 mg in sodium chloride 0.9 % 50 mL IVPB, 12.5 mg, Intravenous, Q6H PRN, Enzo Bi, MD  BP (!) 154/78   Pulse 82   Temp 98.1 F (36.7 C)   Resp (!) 23   Ht '5\' 8"'  (1.727 m)   Wt 135 lb 9.3 oz (61.5 kg)   SpO2 96%   BMI 20.62 kg/m      CMP Latest Ref Rng & Units 10/28/2021  Glucose 70 - 99 mg/dL 107(H)  BUN 6 - 20 mg/dL 99(H)  Creatinine 0.44 - 1.00 mg/dL 16.56(H)  Sodium 135 - 145 mmol/L 134(L)  Potassium 3.5 - 5.1 mmol/L 3.7  Chloride 98 - 111 mmol/L 96(L)  CO2 22 - 32 mmol/L 18(L)  Calcium 8.9 - 10.3 mg/dL 7.6(L)  Total Protein 6.5 - 8.1 g/dL 5.3(L)  Total Bilirubin 0.3 - 1.2 mg/dL 0.9  Alkaline Phos 38 - 126 U/L 32(L)  AST 15 - 41 U/L 17  ALT 0 - 44 U/L 14   CBC Latest Ref Rng & Units 10/28/2021  WBC 4.0 - 10.5 K/uL 12.6(H)  Hemoglobin 12.0 - 15.0 g/dL 8.0(L)  Hematocrit 36.0 - 46.0 % 23.2(L)  Platelets 150 - 400 K/uL 114(L)    DG Chest 2 View  Result Date: 10/27/2021 CLINICAL DATA:  Dyspnea. EXAM: CHEST - 2 VIEW COMPARISON:  None. FINDINGS: The heart size and mediastinal contours are within normal limits. Bibasilar opacities are noted concerning for edema or scarring. Focal airspace opacity is noted in left lower lobe concerning for pneumonia. The visualized skeletal structures are unremarkable. IMPRESSION: Bibasilar opacities are noted concerning for edema or scarring. Focal airspace opacity is noted in left lower lobe concerning for pneumonia. Followup PA and lateral chest X-ray is recommended in 3-4 weeks following trial of antibiotic therapy to ensure resolution and exclude underlying malignancy. Electronically Signed   By: Marijo Conception M.D.   On: 10/27/2021 12:55   US RENAL  Result Date: 10/27/2021 CLINICAL DATA:  Acute kidney injury. EXAM: RENAL / URINARY TRACT ULTRASOUND  COMPLETE COMPARISON:  None. FINDINGS: Right Kidney: Renal measurements: 9.0 x 3.2 x 4.2 cm = volume: 63.9 mL. Increased parenchymal echogenicity. No mass or hydronephrosis visualized. Left Kidney: Renal measurements: 8.4 x 4.3 x 4.6 cm = volume: 88.7 mL. Increased parenchymal echogenicity. No mass  or hydronephrosis visualized. Bladder: Appears normal for degree of bladder distention. Other: Incidental bilateral pleural effusions. IMPRESSION: 1. Echogenic kidneys compatible with medical renal disease. No hydronephrosis. 2. Bilateral pleural effusions. Electronically Signed   By: Logan Bores M.D.   On: 10/27/2021 07:48   DG Chest Port 1 View  Result Date: 10/27/2021 CLINICAL DATA:  Central line placement. EXAM: PORTABLE CHEST 1 VIEW COMPARISON:  October 26, 2021. FINDINGS: Interval placement of left internal jugular catheter with distal tip in expected position of the SVC. No pneumothorax is noted. IMPRESSION: Interval placement of left internal jugular catheter with distal tip in expected position of the SVC. Electronically Signed   By: Marijo Conception M.D.   On: 10/27/2021 09:38   DG Chest Portable 1 View  Result Date: 10/26/2021 CLINICAL DATA:  Shortness of breath and hypoxia EXAM: PORTABLE CHEST 1 VIEW COMPARISON:  Chest x-ray 10/26/2021 FINDINGS: The heart and mediastinal contours are unchanged. Aortic calcification. Marked interval worsening of diffuse interstitial and airspace opacities. No pulmonary edema. No pleural effusion. No pneumothorax. No acute osseous abnormality. IMPRESSION: Marked interval worsening of diffuse interstitial and airspace opacities. Findings suggestive of pulmonary edema. Superimposed infection/inflammation not excluded. Electronically Signed   By: Iven Finn M.D.   On: 10/26/2021 22:10   ECHOCARDIOGRAM COMPLETE  Result Date: 10/27/2021    ECHOCARDIOGRAM REPORT   Patient Name:   DORITA ROWLANDS Date of Exam: 10/27/2021 Medical Rec #:  [id number]              Height:       68.0 in Accession #:    [id number]            Weight:       117.9 lb Date of Birth:  1966-02-01              BSA:          1.633 m Patient Age:    16 years              BP:           148/86 mmHg Patient Gender: F                     HR:           90 bpm. Exam Location:  [facility] Procedure: 2D Echo, Color Doppler and Cardiac Doppler Indications:     Elevated troponin  History:         Patient has no prior history of Echocardiogram examinations.                  Risk Factors:Hypertension and Current Smoker.  Sonographer:     Charmayne Sheer Referring Phys:  [place] OUMA Diagnosing Phys: Kate Sable MD  Sonographer Comments: Technically difficult study due to poor echo windows. TDS due to small rib spaces. IMPRESSIONS  1. Left ventricular ejection fraction, by estimation, is 50 to 55%. The left ventricle has low normal function. The left ventricle has no regional wall motion abnormalities. There is mild left ventricular hypertrophy of the septal segment. Left ventricular diastolic parameters were normal.  2. Right ventricular systolic function is low normal. The right ventricular size is normal.  3. The mitral valve is normal in structure. No evidence of mitral valve regurgitation.  4. The aortic valve was not well visualized. Aortic valve regurgitation is not visualized.  5. The inferior vena cava is normal in size with <50% respiratory variability, suggesting right atrial pressure  of 8 mmHg. FINDINGS  Left Ventricle: Left ventricular ejection fraction, by estimation, is 50 to 55%. The left ventricle has low normal function. The left ventricle has no regional wall motion abnormalities. The left ventricular internal cavity size was normal in size. There is mild left ventricular hypertrophy of the septal segment. Left ventricular diastolic parameters were normal. Right Ventricle: The right ventricular size is normal. No increase in right ventricular wall thickness. Right ventricular systolic  function is low normal. Left Atrium: Left atrial size was normal in size. Right Atrium: Right atrial size was normal in size. Pericardium: There is no evidence of pericardial effusion. Mitral Valve: The mitral valve is normal in structure. No evidence of mitral valve regurgitation. MV peak gradient, 3.9 mmHg. The mean mitral valve gradient is 2.0 mmHg. Tricuspid Valve: The tricuspid valve is not well visualized. Tricuspid valve regurgitation is trivial. Aortic Valve: The aortic valve was not well visualized. Aortic valve regurgitation is not visualized. Aortic valve mean gradient measures 6.0 mmHg. Aortic valve peak gradient measures 9.2 mmHg. Aortic valve area, by VTI measures 3.35 cm. Pulmonic Valve: The pulmonic valve was not well visualized. Pulmonic valve regurgitation is not visualized. Aorta: The aortic root is normal in size and structure. Venous: The inferior vena cava is normal in size with less than 50% respiratory variability, suggesting right atrial pressure of 8 mmHg. IAS/Shunts: No atrial level shunt detected by color flow Doppler.  LEFT VENTRICLE PLAX 2D LVIDd:         4.10 cm   Diastology LVIDs:         3.00 cm   LV e' medial:    6.85 cm/s LV PW:         1.10 cm   LV E/e' medial:  13.1 LV IVS:        1.00 cm   LV e' lateral:   6.96 cm/s LVOT diam:     2.20 cm   LV E/e' lateral: 12.9 LV SV:         85 LV SV Index:   52 LVOT Area:     3.80 cm  RIGHT VENTRICLE RV Basal diam:  3.20 cm LEFT ATRIUM             Index        RIGHT ATRIUM           Index LA diam:        3.80 cm 2.33 cm/m   RA Area:     12.00 cm LA Vol (A2C):   45.8 ml 28.05 ml/m  RA Volume:   27.50 ml  16.84 ml/m LA Vol (A4C):   33.1 ml 20.27 ml/m LA Biplane Vol: 40.8 ml 24.99 ml/m  AORTIC VALVE AV Area (Vmax):    3.28 cm AV Area (Vmean):   2.86 cm AV Area (VTI):     3.35 cm AV Vmax:           152.00 cm/s AV Vmean:          120.000 cm/s AV VTI:            0.254 m AV Peak Grad:      9.2 mmHg AV Mean Grad:      6.0 mmHg LVOT Vmax:          131.00 cm/s LVOT Vmean:        90.200 cm/s LVOT VTI:          0.224 m LVOT/AV VTI ratio: 0.88  AORTA Ao Root diam: 3.40  cm MITRAL VALVE MV Area (PHT): 4.04 cm    SHUNTS MV Area VTI:   3.35 cm    Systemic VTI:  0.22 m MV Peak grad:  3.9 mmHg    Systemic Diam: 2.20 cm MV Mean grad:  2.0 mmHg MV Vmax:       0.98 m/s MV Vmean:      66.4 cm/s MV Decel Time: 188 msec MV E velocity: 90.00 cm/s MV A velocity: 75.40 cm/s MV E/A ratio:  1.19 Kate Sable MD Electronically signed by Kate Sable MD Signature Date/Time: 10/27/2021/2:47:40 PM    Final      Assessment and plan- Patient is a 55 y.o. female currently admitted with  Thrombocytopenia & Anemia - Initial peripheral smear revealed increased schistocytes with concern for hemolysis and TTP. Repeat smear similar.  Reticulocyte count improving today.  LDH was elevated at 450 consistent with hemolysis.  Haptoglobin pending.  PTT and PT/INR are normal.  Hep B negative.  Hep C pending.  HIV pending.  Suspect that etiology is related to malignant hypertension as schistocytes can appear in the setting.  Her bilirubin is normal.  Hemoglobin is improving.  MCV improving.  Platelets improving.  Reticulocyte improving. PLASMIC score for TTP - 3 therefore no recommendation for initiation of plasma exchange or ADAMTS-13 testing at this time.  Anemia-hemoglobin was 5.1 upon admission.  She is received 2 units of PRBCs.  Hemoglobin has improved to 8.  Suspect anemia likely related to renal disease.  Folate low.  Will start on daily supplement.  B12 on the low end.  Can consider oral B12. Recommend daily CBC with differential and daily reticulocyte panel. AKI- eGFR was 2. s/p hemodialysis yesterday and today. Malignant hypertension-now weaned off nicardipine drip.  Being transitioned to oral treatment.  Suspect hypertensive emergency likely cause of schistocytes on smear. Monitor.  Will continue to follow.  Delaney Meigs, DNP [place] [facility] 10/28/2021

## 2021-10-29 DIAGNOSIS — I10 Essential (primary) hypertension: Secondary | ICD-10-CM | POA: Diagnosis not present

## 2021-10-29 DIAGNOSIS — K432 Incisional hernia without obstruction or gangrene: Secondary | ICD-10-CM | POA: Diagnosis not present

## 2021-10-29 DIAGNOSIS — I169 Hypertensive crisis, unspecified: Secondary | ICD-10-CM | POA: Diagnosis not present

## 2021-10-29 DIAGNOSIS — Z720 Tobacco use: Secondary | ICD-10-CM

## 2021-10-29 DIAGNOSIS — N179 Acute kidney failure, unspecified: Secondary | ICD-10-CM | POA: Diagnosis not present

## 2021-10-29 LAB — CBC WITH DIFFERENTIAL/PLATELET
Abs Immature Granulocytes: 0.05 10*3/uL (ref 0.00–0.07)
Basophils Absolute: 0 10*3/uL (ref 0.0–0.1)
Basophils Relative: 0 %
Eosinophils Absolute: 0 10*3/uL (ref 0.0–0.5)
Eosinophils Relative: 0 %
HCT: 25.4 % — ABNORMAL LOW (ref 36.0–46.0)
Hemoglobin: 8.4 g/dL — ABNORMAL LOW (ref 12.0–15.0)
Immature Granulocytes: 1 %
Lymphocytes Relative: 9 %
Lymphs Abs: 0.8 10*3/uL (ref 0.7–4.0)
MCH: 31.7 pg (ref 26.0–34.0)
MCHC: 33.1 g/dL (ref 30.0–36.0)
MCV: 95.8 fL (ref 80.0–100.0)
Monocytes Absolute: 0.6 10*3/uL (ref 0.1–1.0)
Monocytes Relative: 6 %
Neutro Abs: 7.6 10*3/uL (ref 1.7–7.7)
Neutrophils Relative %: 84 %
Platelets: 157 10*3/uL (ref 150–400)
RBC: 2.65 MIL/uL — ABNORMAL LOW (ref 3.87–5.11)
RDW: 21.8 % — ABNORMAL HIGH (ref 11.5–15.5)
WBC: 9 10*3/uL (ref 4.0–10.5)
nRBC: 0 % (ref 0.0–0.2)

## 2021-10-29 LAB — CULTURE, BLOOD (ROUTINE X 2): Special Requests: ADEQUATE

## 2021-10-29 LAB — RETIC PANEL
Immature Retic Fract: 15.9 % (ref 2.3–15.9)
RBC.: 2.62 MIL/uL — ABNORMAL LOW (ref 3.87–5.11)
Retic Count, Absolute: 185.5 10*3/uL (ref 19.0–186.0)
Retic Ct Pct: 7.1 % — ABNORMAL HIGH (ref 0.4–3.1)
Reticulocyte Hemoglobin: 31.4 pg (ref >27.9)

## 2021-10-29 LAB — HEMOGLOBIN AND HEMATOCRIT, BLOOD
HCT: 24 % — ABNORMAL LOW (ref 36.0–46.0)
HCT: 24.1 % — ABNORMAL LOW (ref 36.0–46.0)
Hemoglobin: 7.9 g/dL — ABNORMAL LOW (ref 12.0–15.0)
Hemoglobin: 8.1 g/dL — ABNORMAL LOW (ref 12.0–15.0)

## 2021-10-29 LAB — HCV RNA DIAGNOSIS, NAA: HCV RNA, Quantitation: NOT DETECTED IU/mL

## 2021-10-29 LAB — HAPTOGLOBIN: Haptoglobin: 15 mg/dL — ABNORMAL LOW (ref 33–346)

## 2021-10-29 LAB — LACTATE DEHYDROGENASE: LDH: 339 U/L — ABNORMAL HIGH (ref 98–192)

## 2021-10-29 LAB — ADAMTS13 ACTIVITY: Adamts 13 Activity: 36.6 % — ABNORMAL LOW (ref >66.8)

## 2021-10-29 LAB — ADAMTS13 ACTIVITY REFLEX

## 2021-10-29 MED ORDER — HEPARIN SODIUM (PORCINE) 1000 UNIT/ML IJ SOLN
INTRAMUSCULAR | Status: AC
  Filled 2021-10-29: qty 4

## 2021-10-29 NOTE — Progress Notes (Signed)
   10/29/21 1717  Provider Notification  Provider Name/Title Dr. Billie Ruddy  Date Provider Notified 10/29/21  Time Provider Notified 1717  Notification Type Page  Notification Reason Other (Comment) (BP 123/79. Back from HD and due her daily losartan/amlodipine and BID Coreg. Do you want me to give all 3 with stated BP?)  Provider response Other (Comment) (Give Coreg, hold other 2. If BP good in the morning MD will discuss changing BP meds with Nephrology.)  Date of Provider Response 10/29/21  Time of Provider Response [phone]

## 2021-10-29 NOTE — Progress Notes (Signed)
Working on PD Urgent start at [employer]. Waiting on financial verification.

## 2021-10-29 NOTE — Progress Notes (Signed)
Mobility Specialist - Progress Note   10/29/21 1200  Mobility  Activity Off unit  Mobility performed by Mobility specialist    Pt off unit for HD tx. Will attempt session another date/time.    Kathee Delton Mobility Specialist 10/29/21, 12:15 PM

## 2021-10-29 NOTE — Consult Note (Signed)
Patient ID: Joy Ray, female   DOB: January 22, 1966, 55 y.o.   MRN: [id number]  HPI Joy Ray is a 55 y.o. female seen in consultation at the request of Dr. Holley Raring.  She presented a 3 days ago complaining of shortness of breath and found to have a hypertensive crisis as well and kidney failure requiring urgent HD.  Initially she was found to have diffuse flash pulmonary edema creatinine of 21.    SHe also had a history of tobacco abuse and hypertension.  Patient blood pressure in the ER was 242/154.  This is currently improved significantly. She also endorses some myalgia fatigue and chills. Condition has improved significantly from both pulmonary perspective and blood pressure perspective. Now is laying comfortably in the bed and having hemodialysis. A/Bs have stopped since low yield for ID process. Reports history of tubal ligation in the past.  No other abdominal operations. Labs reviewed hemoglobin of 8.4 white count is normal and platelets are 157,000.  Apparently she did have thrombocytopenia at some point time and has been seen by hematology.  INR was normal.  CMP showed some acidosis and a creatinine of 16.5 and a BUN of 99.  Albumin of 2.8 Currently she denies any shortness of breath or chest pain. She did have troponin leak likely from demand ischemia. She did have a chest x-ray that have personally reviewed showing some bilateral opacities concerning for edema HPI  Past Medical History:  Diagnosis Date   Allergy    Cigarette smoker    HTN, goal below 140/90    Second degree hemorrhoids     Past Surgical History:  Procedure Laterality Date   COLONOSCOPY WITH PROPOFOL N/A 04/21/2016   Procedure: COLONOSCOPY WITH PROPOFOL;  Surgeon: Lucilla Lame, MD;  Location: [facility];  Service: Endoscopy;  Laterality: N/A;   TUBAL LIGATION  1994   After G1P1001    Family History  Problem Relation Age of Onset   Cancer Mother        breast   Breast cancer Mother  31   Breast cancer Maternal Aunt     Social History Social History   Tobacco Use   Smoking status: Light Smoker    Packs/day: 0.50    Years: 20.00    Pack years: 10.00    Types: Cigarettes    Last attempt to quit: 04/21/2016    Years since quitting: 5.5   Smokeless tobacco: Never   Tobacco comments:    pt has cut back to 1 or 2 cigs/day  Vaping Use   Vaping Use: Never used  Substance Use Topics   Alcohol use: Yes    Alcohol/week: 1.0 standard drink    Types: 1 Glasses of wine per week    Comment:     Drug use: No    Allergies  Allergen Reactions   Ciprofloxacin     Pt denies    Current Facility-Administered Medications  Medication Dose Route Frequency Provider Last Rate Last Admin   0.9 %  sodium chloride infusion (Manually program via Guardrails IV Fluids)   Intravenous Once Lang Snow, NP       0.9 %  sodium chloride infusion (Manually program via Guardrails IV Fluids)   Intravenous Once Lang Snow, NP   Held at 10/28/21 0128   amLODipine (NORVASC) tablet 10 mg  10 mg Oral Daily Lateef, Munsoor, MD   10 mg at 10/28/21 1810   carvedilol (COREG) tablet 6.25 mg  6.25 mg Oral BID  WC Lateef, Munsoor, MD   6.25 mg at 10/28/21 1635   Chlorhexidine Gluconate Cloth 2 % PADS 6 each  6 each Topical Q0600 Lateef, Munsoor, MD       docusate sodium (COLACE) capsule 100 mg  100 mg Oral BID PRN Lang Snow, NP       feeding supplement (NEPRO CARB STEADY) liquid 237 mL  237 mL Oral BID BM Enzo Bi, MD       folic acid (FOLVITE) tablet 1 mg  1 mg Oral Daily Verlon Au, NP   1 mg at 10/28/21 2009   hydrALAZINE (APRESOLINE) injection 10 mg  10 mg Intravenous Q4H PRN Lang Snow, NP   10 mg at 10/28/21 0809   ipratropium-albuterol (DUONEB) 0.5-2.5 (3) MG/3ML nebulizer solution 3 mL  3 mL Nebulization Q4H PRN Lang Snow, NP       labetalol (NORMODYNE) injection 10 mg  10 mg Intravenous Q2H PRN Lang Snow, NP    10 mg at 10/28/21 0732   losartan (COZAAR) tablet 100 mg  100 mg Oral Daily Lateef, Munsoor, MD   100 mg at 10/28/21 1810   melatonin tablet 5 mg  5 mg Oral QHS Lang Snow, NP   5 mg at 10/28/21 2009   multivitamin (RENA-VIT) tablet 1 tablet  1 tablet Oral QHS Enzo Bi, MD   1 tablet at 10/28/21 2009   ondansetron (ZOFRAN) injection 4 mg  4 mg Intravenous Q8H PRN Flora Lipps, MD   4 mg at 10/28/21 0839   polyethylene glycol (MIRALAX / GLYCOLAX) packet 17 g  17 g Oral Daily PRN Lang Snow, NP       promethazine (PHENERGAN) 12.5 mg in sodium chloride 0.9 % 50 mL IVPB  12.5 mg Intravenous Q6H PRN Enzo Bi, MD       traMADol Veatrice Bourbon) tablet 100 mg  100 mg Oral Q6H PRN Enzo Bi, MD   100 mg at 10/28/21 1811   vitamin B-12 (CYANOCOBALAMIN) tablet 1,000 mcg  1,000 mcg Oral Daily Verlon Au, NP   1,000 mcg at 10/28/21 2009     Review of Systems Full ROS  was asked and was negative except for the information on the HPI  Physical Exam Blood pressure 121/71, pulse 74, temperature 97.7 F (36.5 C), temperature source Oral, resp. rate 17, height 5\' 8"  (1.727 m), weight 59.3 kg, SpO2 94 %. CONSTITUTIONAL: NAD, being dialyzed via left IJ catheter. EYES: Pupils are equal, round, Sclera are non-icteric. EARS, NOSE, MOUTH AND THROAT: She is wearing a mask, Hearing is intact to voice. LYMPH NODES:  Lymph nodes in the neck are normal. RESPIRATORY:  Lungs are clear. There is normal respiratory effort, with equal breath sounds bilaterally, and without pathologic use of accessory muscles. CARDIOVASCULAR: Heart is regular without murmurs, gallops, or rubs. GI: The abdomen is  soft, nontender, and nondistended.  Reducible incisional hernia umbilical area. There are no palpable masses. There is no hepatosplenomegaly. There are normal bowel sounds GU: Rectal deferred.   MUSCULOSKELETAL: Normal muscle strength and tone. No cyanosis or edema.   SKIN: Turgor is good and there are no  pathologic skin lesions or ulcers. NEUROLOGIC: Motor and sensation is grossly normal. Cranial nerves are grossly intact. PSYCH:  Oriented to person, place and time. Affect is normal.  Data Reviewed  I have personally reviewed the patient's imaging, laboratory findings and medical records.    Assessment/Plan 55 year old female with aggressive end-stage renal disease currently on hemodialysis.  She is very interested in pursuing peritoneal dialysis.  Discussed with patient in detail about this mode therapy.  Also discussed with her in detail about laparoscopic placement of PD catheter.  Given that she does have an incisional hernia I do recommend addressing this at the same time.  Discussed with the patient detail about the procedure.  Risks, benefits and possible implications including but not limited to: Bleeding, infection injury to adjacent organ, chronic pain and catheter malfunction. We will plan to perform this on Tuesday.   Caroleen Hamman, MD FACS General Surgeon 10/29/2021, 2:02 PM

## 2021-10-29 NOTE — Progress Notes (Signed)
PROGRESS NOTE    Joy Ray  ZOX:[id number] DOB: 1966-03-22 DOA: 10/26/2021 PCP: Delsa Grana, PA-C  [id number]   Assessment & Plan:   Active Problems:   Acute respiratory failure with hypoxia (HCC)   Acute kidney injury ([place])   Anemia due to folic acid deficiency   B12 deficiency   Thrombocytopenia (HCC)   Anemia in chronic kidney disease, on chronic dialysis ([place])   Joy Ray is a 55 y.o female with significant PMH of/as below who presented to the ED with chief complaints of progressive shortness of breath x2 days.   She checked her BP at home and noted it to be extremely elevated in the upper 811B systolic with associated mild to moderate headache.  Patient was advised by her PCP to go to the ED for further evaluation.  Patient called EMS today due to worsening symptoms associated with hypoxia and elevated blood pressure of 242/154 mm Hg. She received 10 mg of labetalol on route to the ED and was placed on NRB.   On arrival to the ED, she was hypothermic 94.2 (34.6) with blood pressure 214/125 mm Hg and pulse rate 52 beats/min, RR 28-32 breaths per minute., Sats 56% on NRB.  Stat chest x-ray was obtained and showed diffuse pulmonary edema concerning for flash pulmonary edema with her accelerated hypertension.  She was placed on BiPAP, started on nitro drip follow up by dose of IV Lasix, and nicardipine gtt.   Acute Hypoxic Respiratory Failure secondary to  Flash pulmonary edema  --BNP markedly elevated >45000pg/mL --pt received ceftriaxone and azithromycin x1 dose on presentation for presumed CAP, however, no strong evidence of PNA, so abx not continued. --already weaned down to RA this morning, after dialysis and BP control Plan: --iHD for volume removal   Hypertensive Emergency with Evidence of End Organ Dysfunction (Pulmonary Edema, AKI, Elevated troponin, headache) Severe elevations in blood pressure 214/125 mm Hg on presentation. --off nitro gtt and  nicardipine gtt next morning.  started on amlodipine, coreg and losartan, per nephrology orders Plan: --cont coreg --hold amlodipine and losartan for now since BP now wnl   Current smoker  Elevated Troponin 2/2 demand ischemia --trop 180's flat.  Demand ischemia in the setting of severe HTN and pulm edema.   Severe Acute Kidney Injury or undiagnosed ESRD --renal US showed Echogenic kidneys compatible with medical renal disease. --started on dialysis Plan: --iHD per nephrology --PD cath placement on Tuesday  High Anion Gap Metabolic Acidosis --2/2 kidney failure and severe uremia    Severe Anemia and thrombocytopenia --Hgb 6.1, plt 137 on presentation. --s/p 2u pRBC Plan: --monitor Hgb and plt   DVT prophylaxis: SCD/Compression stockings Code Status: Full code  Family Communication:  Level of care: Telemetry Medical Dispo:   The patient is from: home Anticipated d/c is to: home Anticipated d/c date is: > 3 days Patient currently is not medically ready to d/c due to: newly ESRD, need PD cath placement    Subjective and Interval History:  Pt reported feeling much better.  Headache resolved.  N/V resolved, able to eat now.  Tolerating dialysis.  BP decreasing.   Objective: Vitals:   10/29/21 1530 10/29/21 1545 10/29/21 1600 10/29/21 1712  BP: 118/74 124/75 133/75 123/79  Pulse:    87  Resp: (!) 21 (!) 22 15 17   Temp:    98.9 F (37.2 C)  TempSrc:    Oral  SpO2:      Weight:      Height:  Intake/Output Summary (Last 24 hours) at 10/29/2021 1735 Last data filed at 10/29/2021 1515 Gross per 24 hour  Intake 180 ml  Output 500 ml  Net -320 ml   Filed Weights   10/27/21 1438 10/28/21 0358 10/29/21 1203  Weight: 53.1 kg 61.5 kg 59.3 kg    Examination:   Constitutional: NAD, AAOx3 HEENT: conjunctivae and lids normal, EOMI CV: No cyanosis.   RESP: normal respiratory effort, on RA Extremities: No effusions, edema in BLE SKIN: warm, dry Neuro: II -  XII grossly intact.   Psych: Normal mood and affect.  Appropriate judgement and reason   Data Reviewed: I have personally reviewed following labs and imaging studies  CBC: Recent Labs  Lab 10/26/21 1522 10/26/21 2148 10/26/21 2148 10/27/21 0233 10/27/21 0616 10/27/21 1041 10/28/21 0511 10/28/21 1125 10/29/21 0019 10/29/21 0616 10/29/21 1331  WBC 9.4 11.6*  --  9.6 11.4*  --   --  12.6*  --   --  9.0  NEUTROABS 8.3* 10.7*  --  8.9*  --   --   --  11.6*  --   --  7.6  HGB 6.1* 6.0*   < > 5.5* 5.1*   < > 7.9* 8.0* 8.1* 7.9* 8.4*  HCT 18.6* 18.1*   < > 15.9* 15.0*   < > 22.3* 23.2* 24.0* 24.1* 25.4*  MCV 105* 104.0*  --  103.9* 96.2  --   --  90.6  --   --  95.8  PLT 137* 123*  --  89* 90*  --   --  114*  --   --  157   < > = values in this interval not displayed.   Basic Metabolic Panel: Recent Labs  Lab 10/26/21 2148 10/27/21 0233 10/27/21 0616 10/27/21 1253 10/28/21 0337 10/28/21 1125  NA 133* 133* 135  --  133* 134*  K 5.0 4.9 4.5  --  3.6 3.7  CL 99 100 96*  --  96* 96*  CO2 8* 10* 13*  --  18* 18*  GLUCOSE 236* 104* 92  --  104* 107*  BUN 143* 147* 166*  --  111* 99*  CREATININE 21.86* 22.60* 22.11*  --  16.05* 16.56*  CALCIUM 8.4* 7.9* 7.7*  --  7.4* 7.6*  MG  --  2.0 1.8  --  1.9  --   PHOS  --   --  12.1* 11.5* 8.7*  --    GFR: Estimated Creatinine Clearance: 3.7 mL/min (A) (by C-G formula based on SCr of 16.56 mg/dL (H)). Liver Function Tests: Recent Labs  Lab 10/26/21 2148 10/27/21 0233 10/28/21 0337 10/28/21 1125  AST 19 16  --  17  ALT 8 9  --  14  ALKPHOS 35* 34*  --  32*  BILITOT 0.9 0.9  --  0.9  PROT 5.8* 5.4*  --  5.3*  ALBUMIN 3.2* 2.7* 2.9* 2.8*   No results for input(s): LIPASE, AMYLASE in the last 168 hours. No results for input(s): AMMONIA in the last 168 hours. Coagulation Profile: Recent Labs  Lab 10/26/21 2148 10/28/21 1125  INR 1.2 1.1   Cardiac Enzymes: No results for input(s): CKTOTAL, CKMB, CKMBINDEX, TROPONINI in the  last 168 hours. BNP (last 3 results) No results for input(s): PROBNP in the last 8760 hours. HbA1C: No results for input(s): HGBA1C in the last 72 hours.  CBG: Recent Labs  Lab 10/27/21 0212 10/27/21 1956  GLUCAP 94 103*   Lipid Profile: No results for input(s): CHOL,  HDL, LDLCALC, TRIG, CHOLHDL, LDLDIRECT in the last 72 hours.  Thyroid Function Tests: Recent Labs    10/27/21 0616  TSH 5.343*   Anemia Panel: Recent Labs    10/27/21 0616 10/28/21 1125 10/29/21 1331  VITAMINB12 208 215  --   FOLATE 7.0 4.9*  --   FERRITIN 387*  --   --   TIBC 262  --   --   IRON 32  --   --   RETICCTPCT 11.6* 6.0* 7.1*   Sepsis Labs: Recent Labs  Lab 10/26/21 2148 10/27/21 0022  PROCALCITON  --  1.49  LATICACIDVEN 3.9* 1.9    Recent Results (from the past 240 hour(s))  Blood Culture (routine x 2)     Status: None (Preliminary result)   Collection Time: 10/26/21  9:48 PM   Specimen: BLOOD  Result Value Ref Range Status   Specimen Description BLOOD LEFT FOREARM  Final   Special Requests   Final    BOTTLES DRAWN AEROBIC AND ANAEROBIC Blood Culture adequate volume   Culture   Final    NO GROWTH 3 DAYS Performed at [facility], [address] [place], [place] [address]    Report Status PENDING  Incomplete  Resp Panel by RT-PCR (Flu A&B, Covid) Nasopharyngeal Swab     Status: None   Collection Time: 10/26/21  9:56 PM   Specimen: Nasopharyngeal Swab; Nasopharyngeal(NP) swabs in vial transport medium  Result Value Ref Range Status   SARS Coronavirus 2 by RT PCR NEGATIVE NEGATIVE Final    Comment: (NOTE) SARS-CoV-2 target nucleic acids are NOT DETECTED.  The SARS-CoV-2 RNA is generally detectable in upper respiratory specimens during the acute phase of infection. The lowest concentration of SARS-CoV-2 viral copies this assay can detect is 138 copies/mL. A negative result does not preclude SARS-Cov-2 infection and should not be used as the sole basis for treatment  or other patient management decisions. A negative result may occur with  improper specimen collection/handling, submission of specimen other than nasopharyngeal swab, presence of viral mutation(s) within the areas targeted by this assay, and inadequate number of viral copies(<138 copies/mL). A negative result must be combined with clinical observations, patient history, and epidemiological information. The expected result is Negative.  Fact Sheet for Patients:  EntrepreneurPulse.com.au  Fact Sheet for Healthcare Providers:  IncredibleEmployment.be  This test is no t yet approved or cleared by the [place] FDA and  has been authorized for detection and/or diagnosis of SARS-CoV-2 by FDA under an Emergency Use Authorization (EUA). This EUA will remain  in effect (meaning this test can be used) for the duration of the COVID-19 declaration under Section 564(b)(1) of the Act, 21 U.S.C.section 360bbb-3(b)(1), unless the authorization is terminated  or revoked sooner.       Influenza A by PCR NEGATIVE NEGATIVE Final   Influenza B by PCR NEGATIVE NEGATIVE Final    Comment: (NOTE) The Xpert Xpress SARS-CoV-2/FLU/RSV plus assay is intended as an aid in the diagnosis of influenza from Nasopharyngeal swab specimens and should not be used as a sole basis for treatment. Nasal washings and aspirates are unacceptable for Xpert Xpress SARS-CoV-2/FLU/RSV testing.  Fact Sheet for Patients: EntrepreneurPulse.com.au  Fact Sheet for Healthcare Providers: IncredibleEmployment.be  This test is not yet approved or cleared by the [place] FDA and has been authorized for detection and/or diagnosis of SARS-CoV-2 by FDA under an Emergency Use Authorization (EUA). This EUA will remain in effect (meaning this test can be used) for the duration  of the COVID-19 declaration under Section 564(b)(1) of the Act, 21 U.S.C. section  360bbb-3(b)(1), unless the authorization is terminated or revoked.  Performed at [facility], [address] [place], [place] [address]   Blood Culture (routine x 2)     Status: Abnormal   Collection Time: 10/26/21  9:56 PM   Specimen: BLOOD  Result Value Ref Range Status   Specimen Description   Final    BLOOD RIGHT FOREARM Performed at [facility], [address] [place], [place] [address]    Special Requests   Final    BOTTLES DRAWN AEROBIC AND ANAEROBIC Blood Culture adequate volume Performed at [facility], [address], [place], [place] [address]    Culture  Setup Time   Final    GRAM POSITIVE COCCI Organism ID to follow ANAEROBIC BOTTLE ONLY CRITICAL RESULT CALLED TO, READ BACK BY AND VERIFIED WITH:    Culture (A)  Final    STAPHYLOCOCCUS EPIDERMIDIS THE SIGNIFICANCE OF ISOLATING THIS ORGANISM FROM A SINGLE SET OF BLOOD CULTURES WHEN MULTIPLE SETS ARE DRAWN IS UNCERTAIN. PLEASE NOTIFY THE MICROBIOLOGY DEPARTMENT WITHIN ONE WEEK IF SPECIATION AND SENSITIVITIES ARE REQUIRED. Performed at [facility], [place] [address], [place], [place] [address]    Report Status 10/29/2021 FINAL  Final  Blood Culture ID Panel (Reflexed)     Status: Abnormal   Collection Time: 10/26/21  9:56 PM  Result Value Ref Range Status   Enterococcus faecalis NOT DETECTED NOT DETECTED Final   Enterococcus Faecium NOT DETECTED NOT DETECTED Final   Listeria monocytogenes NOT DETECTED NOT DETECTED Final   Staphylococcus species DETECTED (A) NOT DETECTED Final    Comment: CRITICAL RESULT CALLED TO, READ BACK BY AND VERIFIED WITH: SUSAN WATSON 10/27/21 1554 MU    Staphylococcus aureus (BCID) NOT DETECTED NOT DETECTED Final   Staphylococcus epidermidis DETECTED (A) NOT DETECTED Final    Comment: CRITICAL RESULT CALLED TO, READ BACK BY AND VERIFIED WITH: SUSAN WATSON 10/27/21 1554 MU    Staphylococcus lugdunensis NOT DETECTED NOT DETECTED Final   Streptococcus  species NOT DETECTED NOT DETECTED Final   Streptococcus agalactiae NOT DETECTED NOT DETECTED Final   Streptococcus pneumoniae NOT DETECTED NOT DETECTED Final   Streptococcus pyogenes NOT DETECTED NOT DETECTED Final   A.calcoaceticus-baumannii NOT DETECTED NOT DETECTED Final   Bacteroides fragilis NOT DETECTED NOT DETECTED Final   Enterobacterales NOT DETECTED NOT DETECTED Final   Enterobacter cloacae complex NOT DETECTED NOT DETECTED Final   Escherichia coli NOT DETECTED NOT DETECTED Final   Klebsiella aerogenes NOT DETECTED NOT DETECTED Final   Klebsiella oxytoca NOT DETECTED NOT DETECTED Final   Klebsiella pneumoniae NOT DETECTED NOT DETECTED Final   Proteus species NOT DETECTED NOT DETECTED Final   Salmonella species NOT DETECTED NOT DETECTED Final   Serratia marcescens NOT DETECTED NOT DETECTED Final   Haemophilus influenzae NOT DETECTED NOT DETECTED Final   Neisseria meningitidis NOT DETECTED NOT DETECTED Final   Pseudomonas aeruginosa NOT DETECTED NOT DETECTED Final   Stenotrophomonas maltophilia NOT DETECTED NOT DETECTED Final   Candida albicans NOT DETECTED NOT DETECTED Final   Candida auris NOT DETECTED NOT DETECTED Final   Candida glabrata NOT DETECTED NOT DETECTED Final   Candida krusei NOT DETECTED NOT DETECTED Final   Candida parapsilosis NOT DETECTED NOT DETECTED Final   Candida tropicalis NOT DETECTED NOT DETECTED Final   Cryptococcus neoformans/gattii NOT DETECTED NOT DETECTED Final   Methicillin resistance mecA/C NOT DETECTED NOT DETECTED Final  Comment: Performed at [facility], [address] [place], [place] [address]  MRSA Next Gen by PCR, Nasal     Status: None   Collection Time: 10/27/21  2:37 AM   Specimen: Nasal Mucosa; Nasal Swab  Result Value Ref Range Status   MRSA by PCR Next Gen NOT DETECTED NOT DETECTED Final    Comment: (NOTE) The GeneXpert MRSA Assay (FDA approved for NASAL specimens only), is one component of a comprehensive MRSA  colonization surveillance program. It is not intended to diagnose MRSA infection nor to guide or monitor treatment for MRSA infections. Test performance is not FDA approved in patients less than 64 years old. Performed at [facility], [address], [place],  [address]       Radiology Studies: No results found.   Scheduled Meds:  sodium chloride   Intravenous Once   sodium chloride   Intravenous Once   amLODipine  10 mg Oral Daily   carvedilol  6.25 mg Oral BID WC   Chlorhexidine Gluconate Cloth  6 each Topical Q0600   feeding supplement (NEPRO CARB STEADY)  237 mL Oral BID BM   folic acid  1 mg Oral Daily   heparin sodium (porcine)       losartan  100 mg Oral Daily   melatonin  5 mg Oral QHS   multivitamin  1 tablet Oral QHS   vitamin B-12  1,000 mcg Oral Daily   Continuous Infusions:  promethazine (PHENERGAN) injection (IM or IVPB)       LOS: 3 days     Enzo Bi, MD [facility] If 7PM-7AM, please contact night-coverage 10/29/2021, 5:35 PM

## 2021-10-29 NOTE — Progress Notes (Signed)
[facility]  ROUNDING NOTE   Subjective:  Patient appears to be doing much better after having had her second dialysis treatment yesterday. Blood pressure significantly improved today. Patient due for third dialysis treatment today.   Objective:  Vital signs in last 24 hours:  Temp:  [98.1 F (36.7 C)-99.1 F (37.3 C)] 98.7 F (37.1 C) (11/11 0750) Pulse Rate:  [72-166] 74 (11/11 0750) Resp:  [16-31] 18 (11/11 0750) BP: (108-158)/(73-102) 108/76 (11/11 0750) SpO2:  [94 %-100 %] 94 % (11/11 0750)  Weight change:  Filed Weights   10/27/21 1215 10/27/21 1438 10/28/21 0358  Weight: 53.5 kg 53.1 kg 61.5 kg    Intake/Output: I/O last 3 completed shifts: In: 730 [P.O.:350; Blood:380] Out: 650 [Urine:150; Other:500]   Intake/Output this shift:  Total I/O In: 180 [P.O.:180] Out: -   Physical Exam: General: No acute distress  Head: Normocephalic, atraumatic. Moist oral mucosal membranes  Eyes: Anicteric  Neck: Supple  Lungs:  Clear to auscultation, normal effort  Heart: S1S2 no rubs  Abdomen:  Soft, nontender, bowel sounds present  Extremities: Trace peripheral edema.  Neurologic: Awake, alert, following commands  Skin: No acute rash  Access: IJ temporary dialysis catheter    Basic Metabolic Panel: Recent Labs  Lab 10/26/21 2148 10/27/21 0233 10/27/21 0616 10/27/21 1253 10/28/21 0337 10/28/21 1125  NA 133* 133* 135  --  133* 134*  K 5.0 4.9 4.5  --  3.6 3.7  CL 99 100 96*  --  96* 96*  CO2 8* 10* 13*  --  18* 18*  GLUCOSE 236* 104* 92  --  104* 107*  BUN 143* 147* 166*  --  111* 99*  CREATININE 21.86* 22.60* 22.11*  --  16.05* 16.56*  CALCIUM 8.4* 7.9* 7.7*  --  7.4* 7.6*  MG  --  2.0 1.8  --  1.9  --   PHOS  --   --  12.1* 11.5* 8.7*  --      Liver Function Tests: Recent Labs  Lab 10/26/21 2148 10/27/21 0233 10/28/21 0337 10/28/21 1125  AST 19 16  --  17  ALT 8 9  --  14  ALKPHOS 35* 34*  --  32*  BILITOT 0.9 0.9  --  0.9  PROT  5.8* 5.4*  --  5.3*  ALBUMIN 3.2* 2.7* 2.9* 2.8*    No results for input(s): LIPASE, AMYLASE in the last 168 hours. No results for input(s): AMMONIA in the last 168 hours.  CBC: Recent Labs  Lab 10/26/21 1522 10/26/21 2148 10/26/21 2148 10/27/21 0233 10/27/21 0616 10/27/21 1041 10/28/21 0000 10/28/21 0511 10/28/21 1125 10/29/21 0019 10/29/21 0616  WBC 9.4 11.6*  --  9.6 11.4*  --   --   --  12.6*  --   --   NEUTROABS 8.3* 10.7*  --  8.9*  --   --   --   --  11.6*  --   --   HGB 6.1* 6.0*   < > 5.5* 5.1*   < > 6.2* 7.9* 8.0* 8.1* 7.9*  HCT 18.6* 18.1*   < > 15.9* 15.0*   < > 17.7* 22.3* 23.2* 24.0* 24.1*  MCV 105* 104.0*  --  103.9* 96.2  --   --   --  90.6  --   --   PLT 137* 123*  --  89* 90*  --   --   --  114*  --   --    < > =  values in this interval not displayed.     Cardiac Enzymes: No results for input(s): CKTOTAL, CKMB, CKMBINDEX, TROPONINI in the last 168 hours.  BNP: Invalid input(s): POCBNP  CBG: Recent Labs  Lab 10/27/21 0212 10/27/21 1956  GLUCAP 56 103*     Microbiology: Results for orders placed or performed during the hospital encounter of 10/26/21  Blood Culture (routine x 2)     Status: None (Preliminary result)   Collection Time: 10/26/21  9:48 PM   Specimen: BLOOD  Result Value Ref Range Status   Specimen Description BLOOD LEFT FOREARM  Final   Special Requests   Final    BOTTLES DRAWN AEROBIC AND ANAEROBIC Blood Culture adequate volume   Culture   Final    NO GROWTH 3 DAYS Performed at [facility], [address], [place], [place] [address]    Report Status PENDING  Incomplete  Resp Panel by RT-PCR (Flu A&B, Covid) Nasopharyngeal Swab     Status: None   Collection Time: 10/26/21  9:56 PM   Specimen: Nasopharyngeal Swab; Nasopharyngeal(NP) swabs in vial transport medium  Result Value Ref Range Status   SARS Coronavirus 2 by RT PCR NEGATIVE NEGATIVE Final    Comment: (NOTE) SARS-CoV-2 target nucleic acids are NOT  DETECTED.  The SARS-CoV-2 RNA is generally detectable in upper respiratory specimens during the acute phase of infection. The lowest concentration of SARS-CoV-2 viral copies this assay can detect is 138 copies/mL. A negative result does not preclude SARS-Cov-2 infection and should not be used as the sole basis for treatment or other patient management decisions. A negative result may occur with  improper specimen collection/handling, submission of specimen other than nasopharyngeal swab, presence of viral mutation(s) within the areas targeted by this assay, and inadequate number of viral copies(<138 copies/mL). A negative result must be combined with clinical observations, patient history, and epidemiological information. The expected result is Negative.  Fact Sheet for Patients:  EntrepreneurPulse.com.au  Fact Sheet for Healthcare Providers:  IncredibleEmployment.be  This test is no t yet approved or cleared by the [place] FDA and  has been authorized for detection and/or diagnosis of SARS-CoV-2 by FDA under an Emergency Use Authorization (EUA). This EUA will remain  in effect (meaning this test can be used) for the duration of the COVID-19 declaration under Section 564(b)(1) of the Act, 21 U.S.C.section 360bbb-3(b)(1), unless the authorization is terminated  or revoked sooner.       Influenza A by PCR NEGATIVE NEGATIVE Final   Influenza B by PCR NEGATIVE NEGATIVE Final    Comment: (NOTE) The Xpert Xpress SARS-CoV-2/FLU/RSV plus assay is intended as an aid in the diagnosis of influenza from Nasopharyngeal swab specimens and should not be used as a sole basis for treatment. Nasal washings and aspirates are unacceptable for Xpert Xpress SARS-CoV-2/FLU/RSV testing.  Fact Sheet for Patients: EntrepreneurPulse.com.au  Fact Sheet for Healthcare Providers: IncredibleEmployment.be  This test is not yet  approved or cleared by the [place] FDA and has been authorized for detection and/or diagnosis of SARS-CoV-2 by FDA under an Emergency Use Authorization (EUA). This EUA will remain in effect (meaning this test can be used) for the duration of the COVID-19 declaration under Section 564(b)(1) of the Act, 21 U.S.C. section 360bbb-3(b)(1), unless the authorization is terminated or revoked.  Performed at [facility], [address], [place], [place] [address]   Blood Culture (routine x 2)     Status: Abnormal   Collection Time: 10/26/21  9:56 PM  Specimen: BLOOD  Result Value Ref Range Status   Specimen Description   Final    BLOOD RIGHT FOREARM Performed at [facility], [address] [place], [place] [address]    Special Requests   Final    BOTTLES DRAWN AEROBIC AND ANAEROBIC Blood Culture adequate volume Performed at [facility], [address] [place], [place] [address]    Culture  Setup Time   Final    GRAM POSITIVE COCCI Organism ID to follow ANAEROBIC BOTTLE ONLY CRITICAL RESULT CALLED TO, READ BACK BY AND VERIFIED WITH:    Culture (A)  Final    STAPHYLOCOCCUS EPIDERMIDIS THE SIGNIFICANCE OF ISOLATING THIS ORGANISM FROM A SINGLE SET OF BLOOD CULTURES WHEN MULTIPLE SETS ARE DRAWN IS UNCERTAIN. PLEASE NOTIFY THE MICROBIOLOGY DEPARTMENT WITHIN ONE WEEK IF SPECIATION AND SENSITIVITIES ARE REQUIRED. Performed at [facility] [facility], [place] [address] [place], [place] [address]    Report Status 10/29/2021 FINAL  Final  Blood Culture ID Panel (Reflexed)     Status: Abnormal   Collection Time: 10/26/21  9:56 PM  Result Value Ref Range Status   Enterococcus faecalis NOT DETECTED NOT DETECTED Final   Enterococcus Faecium NOT DETECTED NOT DETECTED Final   Listeria monocytogenes NOT DETECTED NOT DETECTED Final   Staphylococcus species DETECTED (A) NOT DETECTED Final    Comment: CRITICAL RESULT CALLED TO, READ BACK BY AND VERIFIED WITH: SUSAN  WATSON 10/27/21 1554 MU    Staphylococcus aureus (BCID) NOT DETECTED NOT DETECTED Final   Staphylococcus epidermidis DETECTED (A) NOT DETECTED Final    Comment: CRITICAL RESULT CALLED TO, READ BACK BY AND VERIFIED WITH: SUSAN WATSON 10/27/21 1554 MU    Staphylococcus lugdunensis NOT DETECTED NOT DETECTED Final   Streptococcus species NOT DETECTED NOT DETECTED Final   Streptococcus agalactiae NOT DETECTED NOT DETECTED Final   Streptococcus pneumoniae NOT DETECTED NOT DETECTED Final   Streptococcus pyogenes NOT DETECTED NOT DETECTED Final   A.calcoaceticus-baumannii NOT DETECTED NOT DETECTED Final   Bacteroides fragilis NOT DETECTED NOT DETECTED Final   Enterobacterales NOT DETECTED NOT DETECTED Final   Enterobacter cloacae complex NOT DETECTED NOT DETECTED Final   Escherichia coli NOT DETECTED NOT DETECTED Final   Klebsiella aerogenes NOT DETECTED NOT DETECTED Final   Klebsiella oxytoca NOT DETECTED NOT DETECTED Final   Klebsiella pneumoniae NOT DETECTED NOT DETECTED Final   Proteus species NOT DETECTED NOT DETECTED Final   Salmonella species NOT DETECTED NOT DETECTED Final   Serratia marcescens NOT DETECTED NOT DETECTED Final   Haemophilus influenzae NOT DETECTED NOT DETECTED Final   Neisseria meningitidis NOT DETECTED NOT DETECTED Final   Pseudomonas aeruginosa NOT DETECTED NOT DETECTED Final   Stenotrophomonas maltophilia NOT DETECTED NOT DETECTED Final   Candida albicans NOT DETECTED NOT DETECTED Final   Candida auris NOT DETECTED NOT DETECTED Final   Candida glabrata NOT DETECTED NOT DETECTED Final   Candida krusei NOT DETECTED NOT DETECTED Final   Candida parapsilosis NOT DETECTED NOT DETECTED Final   Candida tropicalis NOT DETECTED NOT DETECTED Final   Cryptococcus neoformans/gattii NOT DETECTED NOT DETECTED Final   Methicillin resistance mecA/C NOT DETECTED NOT DETECTED Final    Comment: Performed at [facility], [address] [place], [place] [address]  MRSA  Next Gen by PCR, Nasal     Status: None   Collection Time: 10/27/21  2:37 AM   Specimen: Nasal Mucosa; Nasal Swab  Result Value Ref Range Status   MRSA by PCR Next Gen NOT DETECTED  NOT DETECTED Final    Comment: (NOTE) The GeneXpert MRSA Assay (FDA approved for NASAL specimens only), is one component of a comprehensive MRSA colonization surveillance program. It is not intended to diagnose MRSA infection nor to guide or monitor treatment for MRSA infections. Test performance is not FDA approved in patients less than 45 years old. Performed at [facility], [address] [place], [place] [address]     Coagulation Studies: Recent Labs    10/26/21 03-24-2147 10/28/21 1125  LABPROT 15.0 14.2  INR 1.2 1.1     Urinalysis: No results for input(s): COLORURINE, LABSPEC, PHURINE, GLUCOSEU, HGBUR, BILIRUBINUR, KETONESUR, PROTEINUR, UROBILINOGEN, NITRITE, LEUKOCYTESUR in the last 72 hours.  Invalid input(s): APPERANCEUR    Imaging: ECHOCARDIOGRAM COMPLETE  Result Date: 10/27/2021    ECHOCARDIOGRAM REPORT   Patient Name:   CHAE SHUSTER Date of Exam: 10/27/2021 Medical Rec #:  [id number]             Height:       68.0 in Accession #:    [id number]            Weight:       117.9 lb Date of Birth:  07-Apr-1966              BSA:          1.633 m Patient Age:    89 years              BP:           148/86 mmHg Patient Gender: F                     HR:           90 bpm. Exam Location:  [facility] Procedure: 2D Echo, Color Doppler and Cardiac Doppler Indications:     Elevated troponin  History:         Patient has no prior history of Echocardiogram examinations.                  Risk Factors:Hypertension and Current Smoker.  Sonographer:     Charmayne Sheer Referring Phys:  [place] OUMA Diagnosing Phys: Kate Sable MD  Sonographer Comments: Technically difficult study due to poor echo windows. TDS due to small rib spaces. IMPRESSIONS  1. Left ventricular ejection fraction, by  estimation, is 50 to 55%. The left ventricle has low normal function. The left ventricle has no regional wall motion abnormalities. There is mild left ventricular hypertrophy of the septal segment. Left ventricular diastolic parameters were normal.  2. Right ventricular systolic function is low normal. The right ventricular size is normal.  3. The mitral valve is normal in structure. No evidence of mitral valve regurgitation.  4. The aortic valve was not well visualized. Aortic valve regurgitation is not visualized.  5. The inferior vena cava is normal in size with <50% respiratory variability, suggesting right atrial pressure of 8 mmHg. FINDINGS  Left Ventricle: Left ventricular ejection fraction, by estimation, is 50 to 55%. The left ventricle has low normal function. The left ventricle has no regional wall motion abnormalities. The left ventricular internal cavity size was normal in size. There is mild left ventricular hypertrophy of the septal segment. Left ventricular diastolic parameters were normal. Right Ventricle: The right ventricular size is normal. No increase in right ventricular wall thickness. Right ventricular systolic function is low normal. Left Atrium: Left atrial size was normal in size. Right Atrium: Right atrial  size was normal in size. Pericardium: There is no evidence of pericardial effusion. Mitral Valve: The mitral valve is normal in structure. No evidence of mitral valve regurgitation. MV peak gradient, 3.9 mmHg. The mean mitral valve gradient is 2.0 mmHg. Tricuspid Valve: The tricuspid valve is not well visualized. Tricuspid valve regurgitation is trivial. Aortic Valve: The aortic valve was not well visualized. Aortic valve regurgitation is not visualized. Aortic valve mean gradient measures 6.0 mmHg. Aortic valve peak gradient measures 9.2 mmHg. Aortic valve area, by VTI measures 3.35 cm. Pulmonic Valve: The pulmonic valve was not well visualized. Pulmonic valve regurgitation is not  visualized. Aorta: The aortic root is normal in size and structure. Venous: The inferior vena cava is normal in size with less than 50% respiratory variability, suggesting right atrial pressure of 8 mmHg. IAS/Shunts: No atrial level shunt detected by color flow Doppler.  LEFT VENTRICLE PLAX 2D LVIDd:         4.10 cm   Diastology LVIDs:         3.00 cm   LV e' medial:    6.85 cm/s LV PW:         1.10 cm   LV E/e' medial:  13.1 LV IVS:        1.00 cm   LV e' lateral:   6.96 cm/s LVOT diam:     2.20 cm   LV E/e' lateral: 12.9 LV SV:         85 LV SV Index:   52 LVOT Area:     3.80 cm  RIGHT VENTRICLE RV Basal diam:  3.20 cm LEFT ATRIUM             Index        RIGHT ATRIUM           Index LA diam:        3.80 cm 2.33 cm/m   RA Area:     12.00 cm LA Vol (A2C):   45.8 ml 28.05 ml/m  RA Volume:   27.50 ml  16.84 ml/m LA Vol (A4C):   33.1 ml 20.27 ml/m LA Biplane Vol: 40.8 ml 24.99 ml/m  AORTIC VALVE AV Area (Vmax):    3.28 cm AV Area (Vmean):   2.86 cm AV Area (VTI):     3.35 cm AV Vmax:           152.00 cm/s AV Vmean:          120.000 cm/s AV VTI:            0.254 m AV Peak Grad:      9.2 mmHg AV Mean Grad:      6.0 mmHg LVOT Vmax:         131.00 cm/s LVOT Vmean:        90.200 cm/s LVOT VTI:          0.224 m LVOT/AV VTI ratio: 0.88  AORTA Ao Root diam: 3.40 cm MITRAL VALVE MV Area (PHT): 4.04 cm    SHUNTS MV Area VTI:   3.35 cm    Systemic VTI:  0.22 m MV Peak grad:  3.9 mmHg    Systemic Diam: 2.20 cm MV Mean grad:  2.0 mmHg MV Vmax:       0.98 m/s MV Vmean:      66.4 cm/s MV Decel Time: 188 msec MV E velocity: 90.00 cm/s MV A velocity: 75.40 cm/s MV E/A ratio:  1.19 Kate Sable MD Electronically signed by Kate Sable MD Signature  Date/Time: 10/27/2021/2:47:40 PM    Final      Medications:    promethazine (PHENERGAN) injection (IM or IVPB)      sodium chloride   Intravenous Once   sodium chloride   Intravenous Once   amLODipine  10 mg Oral Daily   carvedilol  6.25 mg Oral BID WC    Chlorhexidine Gluconate Cloth  6 each Topical Q0600   Chlorhexidine Gluconate Cloth  6 each Topical Q0600   feeding supplement (NEPRO CARB STEADY)  237 mL Oral BID BM   folic acid  1 mg Oral Daily   losartan  100 mg Oral Daily   melatonin  5 mg Oral QHS   multivitamin  1 tablet Oral QHS   vitamin B-12  1,000 mcg Oral Daily   docusate sodium, hydrALAZINE, ipratropium-albuterol, labetalol, ondansetron (ZOFRAN) IV, polyethylene glycol, promethazine (PHENERGAN) injection (IM or IVPB), traMADol  Assessment/ Plan:  55 y.o. female with a PMHx of history with history of hypertensive urgency, tobacco abuse, allergic rhinitis, history of hemorrhoids, who was admitted to [facility] on 10/26/2021 for evaluation of progressive shortness of breath.   1.  End-stage renal disease.  Presenting creatinine was very high at 21.86.  Baseline creatinine 0.96 on 08/30/2018.  We have significant concern that this may likely represents ESRD as patient found to have atrophic and echogenic kidneys.  Patient also significantly anemic upon presentation.  Suspect renal insufficiency now secondary to prolonged malignant hypertension.  Patient also with dysgeusia at home. -Patient underwent second hemodialysis treatment yesterday.  We are planning for third dialysis treatment today.  She has started to feel better.  She confirmed her desire for peritoneal dialysis.  We are planning for PD catheter placement on Monday.  Thereafter she can start urgent start PD as an outpatient.  This was discussed with outpatient PD staff.  2.  Acute respiratory failure.  Much improved post extubation.  3.  Malignant hypertension.  Blood pressure much improved at 108/76.  Continue amlodipine, carvedilol, and losartan.  4.  Anemia/thrombocytopenia.  We suspect that the patient's anemia was secondary to underlying kidney disease plus some element of hemolysis from malignant hypertension.  Thrombocytopenia likely secondary to malignant hypertension as  well.  Hemoglobin remained stable at 7.9.  She did receive blood transfusion.  Platelets up slightly to 114,000.  5.  Secondary hyperparathyroidism.  2 days ago serum phosphorus was quite high at 11.5.  Now down to 8.7.  Continue to monitor serum phosphorus.    LOS: 3 Elon Eoff 11/11/202210:41 AM

## 2021-10-30 ENCOUNTER — Encounter: Payer: Self-pay | Admitting: Hospitalist

## 2021-10-30 DIAGNOSIS — D696 Thrombocytopenia, unspecified: Secondary | ICD-10-CM | POA: Diagnosis not present

## 2021-10-30 DIAGNOSIS — E538 Deficiency of other specified B group vitamins: Secondary | ICD-10-CM | POA: Diagnosis not present

## 2021-10-30 DIAGNOSIS — D529 Folate deficiency anemia, unspecified: Secondary | ICD-10-CM | POA: Diagnosis not present

## 2021-10-30 LAB — CBC WITH DIFFERENTIAL/PLATELET
Abs Immature Granulocytes: 0.05 10*3/uL (ref 0.00–0.07)
Basophils Absolute: 0 10*3/uL (ref 0.0–0.1)
Basophils Relative: 0 %
Eosinophils Absolute: 0 10*3/uL (ref 0.0–0.5)
Eosinophils Relative: 0 %
HCT: 26.8 % — ABNORMAL LOW (ref 36.0–46.0)
Hemoglobin: 8.8 g/dL — ABNORMAL LOW (ref 12.0–15.0)
Immature Granulocytes: 1 %
Lymphocytes Relative: 9 %
Lymphs Abs: 1 10*3/uL (ref 0.7–4.0)
MCH: 32.2 pg (ref 26.0–34.0)
MCHC: 32.8 g/dL (ref 30.0–36.0)
MCV: 98.2 fL (ref 80.0–100.0)
Monocytes Absolute: 0.6 10*3/uL (ref 0.1–1.0)
Monocytes Relative: 6 %
Neutro Abs: 8.7 10*3/uL — ABNORMAL HIGH (ref 1.7–7.7)
Neutrophils Relative %: 84 %
Platelets: 180 10*3/uL (ref 150–400)
RBC: 2.73 MIL/uL — ABNORMAL LOW (ref 3.87–5.11)
RDW: 20.9 % — ABNORMAL HIGH (ref 11.5–15.5)
WBC: 10.3 10*3/uL (ref 4.0–10.5)
nRBC: 0 % (ref 0.0–0.2)

## 2021-10-30 LAB — TYPE AND SCREEN
ABO/RH(D): O POS
Antibody Screen: NEGATIVE
Unit division: 0
Unit division: 0
Unit division: 0

## 2021-10-30 LAB — BASIC METABOLIC PANEL
Anion gap: 11 (ref 5–15)
BUN: 29 mg/dL — ABNORMAL HIGH (ref 6–20)
CO2: 27 mmol/L (ref 22–32)
Calcium: 7.7 mg/dL — ABNORMAL LOW (ref 8.9–10.3)
Chloride: 100 mmol/L (ref 98–111)
Creatinine, Ser: 7.02 mg/dL — ABNORMAL HIGH (ref 0.44–1.00)
GFR, Estimated: 6 mL/min — ABNORMAL LOW (ref >60)
Glucose, Bld: 106 mg/dL — ABNORMAL HIGH (ref 70–99)
Potassium: 3.3 mmol/L — ABNORMAL LOW (ref 3.5–5.1)
Sodium: 138 mmol/L (ref 135–145)

## 2021-10-30 LAB — MAGNESIUM: Magnesium: 1.8 mg/dL (ref 1.7–2.4)

## 2021-10-30 LAB — BPAM RBC
Blood Product Expiration Date: 202212132359
Blood Product Expiration Date: 202212132359
Blood Product Expiration Date: 202212132359
ISSUE DATE / TIME: 202211090523
ISSUE DATE / TIME: 202211100111
Unit Type and Rh: 5100
Unit Type and Rh: 5100
Unit Type and Rh: 5100

## 2021-10-30 NOTE — Plan of Care (Signed)
Continuing with plan of care. 

## 2021-10-30 NOTE — Progress Notes (Signed)
PROGRESS NOTE    Joy Ray  HUT:[id number] DOB: 1966-04-18 DOA: 10/26/2021 PCP: Delsa Grana, PA-C  [id number]   Assessment & Plan:   Active Problems:   Acute respiratory failure with hypoxia (HCC)   Acute kidney injury ([place])   Anemia due to folic acid deficiency   B12 deficiency   Thrombocytopenia (HCC)   Anemia in chronic kidney disease, on chronic dialysis ([place])   Joy Ray is a 55 y.o female with significant PMH of/as below who presented to the ED with chief complaints of progressive shortness of breath x2 days.   She checked her BP at home and noted it to be extremely elevated in the upper 656C systolic with associated mild to moderate headache.  Patient was advised by her PCP to go to the ED for further evaluation.  Patient called EMS today due to worsening symptoms associated with hypoxia and elevated blood pressure of 242/154 mm Hg. She received 10 mg of labetalol on route to the ED and was placed on NRB.   On arrival to the ED, she was hypothermic 94.2 (34.6) with blood pressure 214/125 mm Hg and pulse rate 52 beats/min, RR 28-32 breaths per minute., Sats 56% on NRB.  Stat chest x-ray was obtained and showed diffuse pulmonary edema concerning for flash pulmonary edema with her accelerated hypertension.  She was placed on BiPAP, started on nitro drip follow up by dose of IV Lasix, and nicardipine gtt.   Acute Hypoxic Respiratory Failure secondary to  Flash pulmonary edema  --BNP markedly elevated >45000pg/mL --pt received ceftriaxone and azithromycin x1 dose on presentation for presumed CAP, however, no strong evidence of PNA, so abx not continued. --already weaned down to RA this morning, after dialysis and BP control Plan: --iHD for volume removal   Hypertensive Emergency with Evidence of End Organ Dysfunction (Pulmonary Edema, AKI, Elevated troponin, headache) Severe elevations in blood pressure 214/125 mm Hg on presentation. --off nitro gtt and  nicardipine gtt next morning.  started on amlodipine, coreg and losartan, per nephrology orders Plan: --cont coreg, amlodipine and losartan   Current smoker  Elevated Troponin 2/2 demand ischemia --trop 180's flat.  Demand ischemia in the setting of severe HTN and pulm edema.   Severe Acute Kidney Injury or undiagnosed ESRD --renal US showed Echogenic kidneys compatible with medical renal disease. --started on dialysis Plan: --iHD per nephrology --PD cath placement on Tuesday  High Anion Gap Metabolic Acidosis --2/2 kidney failure and severe uremia    Severe Anemia and thrombocytopenia --Hgb 6.1, plt 137 on presentation. --s/p 2u pRBC Plan: --monitor Hgb   DVT prophylaxis: SCD/Compression stockings Code Status: Full code  Family Communication: family updated at bedside today Level of care: Telemetry Medical Dispo:   The patient is from: home Anticipated d/c is to: home Anticipated d/c date is: 2-3 days Patient currently is not medically ready to d/c due to: newly ESRD, need PD cath placement    Subjective and Interval History:  No headache, no N/V, however, has not been eating well due to not liking the food.    Objective: Vitals:   10/29/21 1712 10/29/21 2034 10/30/21 0500 10/30/21 0754  BP: 123/79 120/73  (!) 154/96  Pulse: 87 86  78  Resp: 17 18  20   Temp: 98.9 F (37.2 C) 98.6 F (37 C)  98.5 F (36.9 C)  TempSrc: Oral Oral  Oral  SpO2:  94%  98%  Weight:   57.9 kg   Height:  Intake/Output Summary (Last 24 hours) at 10/30/2021 1653 Last data filed at 10/29/2021 1845 Gross per 24 hour  Intake 180 ml  Output --  Net 180 ml   Filed Weights   10/28/21 0358 10/29/21 1203 10/30/21 0500  Weight: 61.5 kg 59.3 kg 57.9 kg    Examination:   Constitutional: NAD, AAOx3 HEENT: conjunctivae and lids normal, EOMI CV: No cyanosis.   RESP: normal respiratory effort, on RA Extremities: No effusions, edema in BLE SKIN: warm, dry Neuro: II - XII  grossly intact.   Psych: Normal mood and affect.  Appropriate judgement and reason   Data Reviewed: I have personally reviewed following labs and imaging studies  CBC: Recent Labs  Lab 10/26/21 2148 10/27/21 0233 10/27/21 0616 10/27/21 1041 10/28/21 1125 10/29/21 0019 10/29/21 0616 10/29/21 1331 10/30/21 0708  WBC 11.6* 9.6 11.4*  --  12.6*  --   --  9.0 10.3  NEUTROABS 10.7* 8.9*  --   --  11.6*  --   --  7.6 8.7*  HGB 6.0* 5.5* 5.1*   < > 8.0* 8.1* 7.9* 8.4* 8.8*  HCT 18.1* 15.9* 15.0*   < > 23.2* 24.0* 24.1* 25.4* 26.8*  MCV 104.0* 103.9* 96.2  --  90.6  --   --  95.8 98.2  PLT 123* 89* 90*  --  114*  --   --  157 180   < > = values in this interval not displayed.   Basic Metabolic Panel: Recent Labs  Lab 10/27/21 0233 10/27/21 0616 10/27/21 1253 10/28/21 0337 10/28/21 1125 10/30/21 0708  NA 133* 135  --  133* 134* 138  K 4.9 4.5  --  3.6 3.7 3.3*  CL 100 96*  --  96* 96* 100  CO2 10* 13*  --  18* 18* 27  GLUCOSE 104* 92  --  104* 107* 106*  BUN 147* 166*  --  111* 99* 29*  CREATININE 22.60* 22.11*  --  16.05* 16.56* 7.02*  CALCIUM 7.9* 7.7*  --  7.4* 7.6* 7.7*  MG 2.0 1.8  --  1.9  --  1.8  PHOS  --  12.1* 11.5* 8.7*  --   --    GFR: Estimated Creatinine Clearance: 8.5 mL/min (A) (by C-G formula based on SCr of 7.02 mg/dL (H)). Liver Function Tests: Recent Labs  Lab 10/26/21 2148 10/27/21 0233 10/28/21 0337 10/28/21 1125  AST 19 16  --  17  ALT 8 9  --  14  ALKPHOS 35* 34*  --  32*  BILITOT 0.9 0.9  --  0.9  PROT 5.8* 5.4*  --  5.3*  ALBUMIN 3.2* 2.7* 2.9* 2.8*   No results for input(s): LIPASE, AMYLASE in the last 168 hours. No results for input(s): AMMONIA in the last 168 hours. Coagulation Profile: Recent Labs  Lab 10/26/21 2148 10/28/21 1125  INR 1.2 1.1   Cardiac Enzymes: No results for input(s): CKTOTAL, CKMB, CKMBINDEX, TROPONINI in the last 168 hours. BNP (last 3 results) No results for input(s): PROBNP in the last 8760  hours. HbA1C: No results for input(s): HGBA1C in the last 72 hours.  CBG: Recent Labs  Lab 10/27/21 0212 10/27/21 1956  GLUCAP 94 103*   Lipid Profile: No results for input(s): CHOL, HDL, LDLCALC, TRIG, CHOLHDL, LDLDIRECT in the last 72 hours.  Thyroid Function Tests: No results for input(s): TSH, T4TOTAL, FREET4, T3FREE, THYROIDAB in the last 72 hours.  Anemia Panel: Recent Labs    10/28/21 1125 10/29/21 1331  VITAMINB12 215  --   FOLATE 4.9*  --   RETICCTPCT 6.0* 7.1*   Sepsis Labs: Recent Labs  Lab 10/26/21 2148 10/27/21 0022  PROCALCITON  --  1.49  LATICACIDVEN 3.9* 1.9    Recent Results (from the past 240 hour(s))  Blood Culture (routine x 2)     Status: None (Preliminary result)   Collection Time: 10/26/21  9:48 PM   Specimen: BLOOD  Result Value Ref Range Status   Specimen Description BLOOD LEFT FOREARM  Final   Special Requests   Final    BOTTLES DRAWN AEROBIC AND ANAEROBIC Blood Culture adequate volume   Culture   Final    NO GROWTH 4 DAYS Performed at [facility], [address] [place], [place] [address]    Report Status PENDING  Incomplete  Resp Panel by RT-PCR (Flu A&B, Covid) Nasopharyngeal Swab     Status: None   Collection Time: 10/26/21  9:56 PM   Specimen: Nasopharyngeal Swab; Nasopharyngeal(NP) swabs in vial transport medium  Result Value Ref Range Status   SARS Coronavirus 2 by RT PCR NEGATIVE NEGATIVE Final    Comment: (NOTE) SARS-CoV-2 target nucleic acids are NOT DETECTED.  The SARS-CoV-2 RNA is generally detectable in upper respiratory specimens during the acute phase of infection. The lowest concentration of SARS-CoV-2 viral copies this assay can detect is 138 copies/mL. A negative result does not preclude SARS-Cov-2 infection and should not be used as the sole basis for treatment or other patient management decisions. A negative result may occur with  improper specimen collection/handling, submission of specimen  other than nasopharyngeal swab, presence of viral mutation(s) within the areas targeted by this assay, and inadequate number of viral copies(<138 copies/mL). A negative result must be combined with clinical observations, patient history, and epidemiological information. The expected result is Negative.  Fact Sheet for Patients:  EntrepreneurPulse.com.au  Fact Sheet for Healthcare Providers:  IncredibleEmployment.be  This test is no t yet approved or cleared by the [place] FDA and  has been authorized for detection and/or diagnosis of SARS-CoV-2 by FDA under an Emergency Use Authorization (EUA). This EUA will remain  in effect (meaning this test can be used) for the duration of the COVID-19 declaration under Section 564(b)(1) of the Act, 21 U.S.C.section 360bbb-3(b)(1), unless the authorization is terminated  or revoked sooner.       Influenza A by PCR NEGATIVE NEGATIVE Final   Influenza B by PCR NEGATIVE NEGATIVE Final    Comment: (NOTE) The Xpert Xpress SARS-CoV-2/FLU/RSV plus assay is intended as an aid in the diagnosis of influenza from Nasopharyngeal swab specimens and should not be used as a sole basis for treatment. Nasal washings and aspirates are unacceptable for Xpert Xpress SARS-CoV-2/FLU/RSV testing.  Fact Sheet for Patients: EntrepreneurPulse.com.au  Fact Sheet for Healthcare Providers: IncredibleEmployment.be  This test is not yet approved or cleared by the [place] FDA and has been authorized for detection and/or diagnosis of SARS-CoV-2 by FDA under an Emergency Use Authorization (EUA). This EUA will remain in effect (meaning this test can be used) for the duration of the COVID-19 declaration under Section 564(b)(1) of the Act, 21 U.S.C. section 360bbb-3(b)(1), unless the authorization is terminated or revoked.  Performed at [facility], [address],  [place], [place] [address]   Blood Culture (routine x 2)     Status: Abnormal   Collection Time: 10/26/21  9:56 PM   Specimen: BLOOD  Result Value Ref Range Status   Specimen Description  Final    BLOOD RIGHT FOREARM Performed at [facility], [address] [place], [place] [address]    Special Requests   Final    BOTTLES DRAWN AEROBIC AND ANAEROBIC Blood Culture adequate volume Performed at [facility], [address] [place], [place] [address]    Culture  Setup Time   Final    GRAM POSITIVE COCCI Organism ID to follow ANAEROBIC BOTTLE ONLY CRITICAL RESULT CALLED TO, READ BACK BY AND VERIFIED WITH:    Culture (A)  Final    STAPHYLOCOCCUS EPIDERMIDIS THE SIGNIFICANCE OF ISOLATING THIS ORGANISM FROM A SINGLE SET OF BLOOD CULTURES WHEN MULTIPLE SETS ARE DRAWN IS UNCERTAIN. PLEASE NOTIFY THE MICROBIOLOGY DEPARTMENT WITHIN ONE WEEK IF SPECIATION AND SENSITIVITIES ARE REQUIRED. Performed at [facility], [place] [address], [place], [place] [address]    Report Status 10/29/2021 FINAL  Final  Blood Culture ID Panel (Reflexed)     Status: Abnormal   Collection Time: 10/26/21  9:56 PM  Result Value Ref Range Status   Enterococcus faecalis NOT DETECTED NOT DETECTED Final   Enterococcus Faecium NOT DETECTED NOT DETECTED Final   Listeria monocytogenes NOT DETECTED NOT DETECTED Final   Staphylococcus species DETECTED (A) NOT DETECTED Final    Comment: CRITICAL RESULT CALLED TO, READ BACK BY AND VERIFIED WITH: SUSAN WATSON 10/27/21 1554 MU    Staphylococcus aureus (BCID) NOT DETECTED NOT DETECTED Final   Staphylococcus epidermidis DETECTED (A) NOT DETECTED Final    Comment: CRITICAL RESULT CALLED TO, READ BACK BY AND VERIFIED WITH: SUSAN WATSON 10/27/21 1554 MU    Staphylococcus lugdunensis NOT DETECTED NOT DETECTED Final   Streptococcus species NOT DETECTED NOT DETECTED Final   Streptococcus agalactiae NOT DETECTED NOT DETECTED Final   Streptococcus pneumoniae NOT  DETECTED NOT DETECTED Final   Streptococcus pyogenes NOT DETECTED NOT DETECTED Final   A.calcoaceticus-baumannii NOT DETECTED NOT DETECTED Final   Bacteroides fragilis NOT DETECTED NOT DETECTED Final   Enterobacterales NOT DETECTED NOT DETECTED Final   Enterobacter cloacae complex NOT DETECTED NOT DETECTED Final   Escherichia coli NOT DETECTED NOT DETECTED Final   Klebsiella aerogenes NOT DETECTED NOT DETECTED Final   Klebsiella oxytoca NOT DETECTED NOT DETECTED Final   Klebsiella pneumoniae NOT DETECTED NOT DETECTED Final   Proteus species NOT DETECTED NOT DETECTED Final   Salmonella species NOT DETECTED NOT DETECTED Final   Serratia marcescens NOT DETECTED NOT DETECTED Final   Haemophilus influenzae NOT DETECTED NOT DETECTED Final   Neisseria meningitidis NOT DETECTED NOT DETECTED Final   Pseudomonas aeruginosa NOT DETECTED NOT DETECTED Final   Stenotrophomonas maltophilia NOT DETECTED NOT DETECTED Final   Candida albicans NOT DETECTED NOT DETECTED Final   Candida auris NOT DETECTED NOT DETECTED Final   Candida glabrata NOT DETECTED NOT DETECTED Final   Candida krusei NOT DETECTED NOT DETECTED Final   Candida parapsilosis NOT DETECTED NOT DETECTED Final   Candida tropicalis NOT DETECTED NOT DETECTED Final   Cryptococcus neoformans/gattii NOT DETECTED NOT DETECTED Final   Methicillin resistance mecA/C NOT DETECTED NOT DETECTED Final    Comment: Performed at [facility], [address] [place], [place] [address]  MRSA Next Gen by PCR, Nasal     Status: None   Collection Time: 10/27/21  2:37 AM   Specimen: Nasal Mucosa; Nasal Swab  Result Value Ref Range Status   MRSA by PCR Next Gen NOT DETECTED NOT DETECTED Final    Comment: (NOTE) The GeneXpert MRSA Assay (FDA approved for  NASAL specimens only), is one component of a comprehensive MRSA colonization surveillance program. It is not intended to diagnose MRSA infection nor to guide or monitor treatment for MRSA  infections. Test performance is not FDA approved in patients less than 41 years old. Performed at [facility], [address] [place], [place] [address]       Radiology Studies: No results found.   Scheduled Meds:  sodium chloride   Intravenous Once   sodium chloride   Intravenous Once   amLODipine  10 mg Oral Daily   carvedilol  6.25 mg Oral BID WC   Chlorhexidine Gluconate Cloth  6 each Topical Q0600   feeding supplement (NEPRO CARB STEADY)  237 mL Oral BID BM   folic acid  1 mg Oral Daily   losartan  100 mg Oral Daily   melatonin  5 mg Oral QHS   multivitamin  1 tablet Oral QHS   vitamin B-12  1,000 mcg Oral Daily   Continuous Infusions:  promethazine (PHENERGAN) injection (IM or IVPB)       LOS: 4 days     Enzo Bi, MD [facility] If 7PM-7AM, please contact night-coverage 10/30/2021, 4:53 PM

## 2021-10-30 NOTE — Progress Notes (Signed)
[facility]  PROGRESS NOTE   Subjective:   Feels much better today. Had stable dialysis via left IJ dialysis catheter.  Objective:  Vital signs in last 24 hours:  Temp:  [98.5 F (36.9 C)-98.9 F (37.2 C)] 98.5 F (36.9 C) (11/12 0754) Pulse Rate:  [78-87] 78 (11/12 0754) Resp:  [15-22] 20 (11/12 0754) BP: (118-154)/(73-96) 154/96 (11/12 0754) SpO2:  [94 %-98 %] 98 % (11/12 0754) Weight:  [57.9 kg] 57.9 kg (11/12 0500)  Weight change:  Filed Weights   10/28/21 0358 10/29/21 1203 10/30/21 0500  Weight: 61.5 kg 59.3 kg 57.9 kg    Intake/Output: I/O last 3 completed shifts: In: 360 [P.O.:360] Out: 500 [Other:500]   Intake/Output this shift:  No intake/output data recorded.  Physical Exam: General:  No acute distress  Head:  Normocephalic, atraumatic. Moist oral mucosal membranes  Eyes:  Anicteric  Neck:  Supple  Lungs:   Clear to auscultation, normal effort  Heart:  S1S2 no rubs  Abdomen:   Soft, nontender, bowel sounds present  Extremities:  peripheral edema.  Neurologic:  Awake, alert, following commands  Skin:  No lesions  Access:     Basic Metabolic Panel: Recent Labs  Lab 10/27/21 0233 10/27/21 0616 10/27/21 1253 10/28/21 0337 10/28/21 1125 10/30/21 0708  NA 133* 135  --  133* 134* 138  K 4.9 4.5  --  3.6 3.7 3.3*  CL 100 96*  --  96* 96* 100  CO2 10* 13*  --  18* 18* 27  GLUCOSE 104* 92  --  104* 107* 106*  BUN 147* 166*  --  111* 99* 29*  CREATININE 22.60* 22.11*  --  16.05* 16.56* 7.02*  CALCIUM 7.9* 7.7*  --  7.4* 7.6* 7.7*  MG 2.0 1.8  --  1.9  --  1.8  PHOS  --  12.1* 11.5* 8.7*  --   --     CBC: Recent Labs  Lab 10/26/21 2148 10/27/21 0233 10/27/21 0616 10/27/21 1041 10/28/21 1125 10/29/21 0019 10/29/21 0616 10/29/21 1331 10/30/21 0708  WBC 11.6* 9.6 11.4*  --  12.6*  --   --  9.0 10.3  NEUTROABS 10.7* 8.9*  --   --  11.6*  --   --  7.6 8.7*  HGB 6.0* 5.5* 5.1*   < > 8.0* 8.1* 7.9* 8.4* 8.8*  HCT 18.1* 15.9*  15.0*   < > 23.2* 24.0* 24.1* 25.4* 26.8*  MCV 104.0* 103.9* 96.2  --  90.6  --   --  95.8 98.2  PLT 123* 89* 90*  --  114*  --   --  157 180   < > = values in this interval not displayed.     Urinalysis: No results for input(s): COLORURINE, LABSPEC, PHURINE, GLUCOSEU, HGBUR, BILIRUBINUR, KETONESUR, PROTEINUR, UROBILINOGEN, NITRITE, LEUKOCYTESUR in the last 72 hours.  Invalid input(s): APPERANCEUR    Imaging: No results found.   Medications:    promethazine (PHENERGAN) injection (IM or IVPB)      sodium chloride   Intravenous Once   sodium chloride   Intravenous Once   amLODipine  10 mg Oral Daily   carvedilol  6.25 mg Oral BID WC   Chlorhexidine Gluconate Cloth  6 each Topical Q0600   feeding supplement (NEPRO CARB STEADY)  237 mL Oral BID BM   folic acid  1 mg Oral Daily   losartan  100 mg Oral Daily   melatonin  5 mg Oral QHS   multivitamin  1  tablet Oral QHS   vitamin B-12  1,000 mcg Oral Daily    Assessment/ Plan:     Active Problems:   Acute respiratory failure with hypoxia (HCC)   Acute kidney injury ([place])   Anemia due to folic acid deficiency   B12 deficiency   Thrombocytopenia (HCC)   Anemia in chronic kidney disease, on chronic dialysis [facility])  55 year old female with history of hypertension now with hypertensive urgency, acute kidney injury on the top of chronic kidney disease and was started on hemodialysis.  #1: Acute kidney injury on CKD: Patient was initiated on dialysis and had 3 treatments so far.  She feels much better at this time.  Renal indicis are improving.  #2: Acute respiratory failure: She is now extubated and feels much better.  #3: Malignant hypertension: Blood pressure is much better controlled at this time.  We will continue the amlodipine, carvedilol and losartan at the present doses.  #4: Anemia: Patient is s/p 1 unit of PRBC.  We will continue anemia protocols.  #5: Secondary hyperparathyroidism: We will continue to monitor closely  and start on binders as needed.  Will follow closely.   LOS: [place], [facility] 11/12/20223:21 PM

## 2021-10-30 NOTE — Progress Notes (Signed)
Joy Ray   DOB:August 06, 1966   EP#:[id number]   YSA#:[id number]  Subjective: Remains hospitalized. Tolerating dialysis well. No new complaints. Tired and hungry but otherwise feels well.    Objective:  Vitals:   10/29/21 2034 10/30/21 0754  BP: 120/73 (!) 154/96  Pulse: 86 78  Resp: 18 20  Temp: 98.6 F (37 C) 98.5 F (36.9 C)  SpO2: 94% 98%    Body mass index is 19.41 kg/m.  Intake/Output Summary (Last 24 hours) at 10/30/2021 1416 Last data filed at 10/29/2021 1845 Gross per 24 hour  Intake 180 ml  Output 500 ml  Net -320 ml    General: Well-developed, well-nourished, no acute distress. Eyes: Pink conjunctiva, anicteric sclera. Lungs: Clear to auscultation bilaterally.  No audible wheezing or coughing Heart: Regular rate and rhythm.  Abdomen: Soft, nontender, nondistended.  Musculoskeletal: No edema, cyanosis, or clubbing. Neuro: Alert, answering all questions appropriately. Cranial nerves grossly intact. Skin: No rashes or petechiae noted. Psych: Normal affect.   CBG (last 3)  Recent Labs    10/27/21 1956  GLUCAP 103*     Labs:  Lab Results  Component Value Date   WBC 10.3 10/30/2021   HGB 8.8 (L) 10/30/2021   HCT 26.8 (L) 10/30/2021   MCV 98.2 10/30/2021   PLT 180 10/30/2021   NEUTROABS 8.7 (H) 32/35/5732    Basic Metabolic Panel: Recent Labs  Lab 10/27/21 0233 10/27/21 0616 10/27/21 1253 10/28/21 0337 10/28/21 1125 10/30/21 0708  NA 133* 135  --  133* 134* 138  K 4.9 4.5  --  3.6 3.7 3.3*  CL 100 96*  --  96* 96* 100  CO2 10* 13*  --  18* 18* 27  GLUCOSE 104* 92  --  104* 107* 106*  BUN 147* 166*  --  111* 99* 29*  CREATININE 22.60* 22.11*  --  16.05* 16.56* 7.02*  CALCIUM 7.9* 7.7*  --  7.4* 7.6* 7.7*  MG 2.0 1.8  --  1.9  --  1.8  PHOS  --  12.1* 11.5* 8.7*  --   --    GFR Estimated Creatinine Clearance: 8.5 mL/min (A) (by C-G formula based on SCr of 7.02 mg/dL (H)). Liver Function Tests: Recent Labs  Lab 10/26/21 2148  10/27/21 0233 10/28/21 0337 10/28/21 1125  AST 19 16  --  17  ALT 8 9  --  14  ALKPHOS 35* 34*  --  32*  BILITOT 0.9 0.9  --  0.9  PROT 5.8* 5.4*  --  5.3*  ALBUMIN 3.2* 2.7* 2.9* 2.8*   No results for input(s): LIPASE, AMYLASE in the last 168 hours. No results for input(s): AMMONIA in the last 168 hours. Coagulation profile Recent Labs  Lab 10/26/21 2148 10/28/21 1125  INR 1.2 1.1    CBC: Recent Labs  Lab 10/26/21 2148 10/27/21 0233 10/27/21 0616 10/27/21 1041 10/28/21 1125 10/29/21 0019 10/29/21 0616 10/29/21 1331 10/30/21 0708  WBC 11.6* 9.6 11.4*  --  12.6*  --   --  9.0 10.3  NEUTROABS 10.7* 8.9*  --   --  11.6*  --   --  7.6 8.7*  HGB 6.0* 5.5* 5.1*   < > 8.0* 8.1* 7.9* 8.4* 8.8*  HCT 18.1* 15.9* 15.0*   < > 23.2* 24.0* 24.1* 25.4* 26.8*  MCV 104.0* 103.9* 96.2  --  90.6  --   --  95.8 98.2  PLT 123* 89* 90*  --  114*  --   --  157 180   < > =  values in this interval not displayed.   Anemia work up Recent Labs    10/28/21 1125 10/29/21 1331  VITAMINB12 215  --   FOLATE 4.9*  --   RETICCTPCT 6.0* 7.1*   Microbiology Recent Results (from the past 240 hour(s))  Blood Culture (routine x 2)     Status: None (Preliminary result)   Collection Time: 10/26/21  9:48 PM   Specimen: BLOOD  Result Value Ref Range Status   Specimen Description BLOOD LEFT FOREARM  Final   Special Requests   Final    BOTTLES DRAWN AEROBIC AND ANAEROBIC Blood Culture adequate volume   Culture   Final    NO GROWTH 4 DAYS Performed at [facility], [address] [place], [place] [address]    Report Status PENDING  Incomplete  Resp Panel by RT-PCR (Flu A&B, Covid) Nasopharyngeal Swab     Status: None   Collection Time: 10/26/21  9:56 PM   Specimen: Nasopharyngeal Swab; Nasopharyngeal(NP) swabs in vial transport medium  Result Value Ref Range Status   SARS Coronavirus 2 by RT PCR NEGATIVE NEGATIVE Final    Comment: (NOTE) SARS-CoV-2 target nucleic acids are NOT  DETECTED.  The SARS-CoV-2 RNA is generally detectable in upper respiratory specimens during the acute phase of infection. The lowest concentration of SARS-CoV-2 viral copies this assay can detect is 138 copies/mL. A negative result does not preclude SARS-Cov-2 infection and should not be used as the sole basis for treatment or other patient management decisions. A negative result may occur with  improper specimen collection/handling, submission of specimen other than nasopharyngeal swab, presence of viral mutation(s) within the areas targeted by this assay, and inadequate number of viral copies(<138 copies/mL). A negative result must be combined with clinical observations, patient history, and epidemiological information. The expected result is Negative.  Fact Sheet for Patients:  EntrepreneurPulse.com.au  Fact Sheet for Healthcare Providers:  IncredibleEmployment.be  This test is no t yet approved or cleared by the [place] FDA and  has been authorized for detection and/or diagnosis of SARS-CoV-2 by FDA under an Emergency Use Authorization (EUA). This EUA will remain  in effect (meaning this test can be used) for the duration of the COVID-19 declaration under Section 564(b)(1) of the Act, 21 U.S.C.section 360bbb-3(b)(1), unless the authorization is terminated  or revoked sooner.       Influenza A by PCR NEGATIVE NEGATIVE Final   Influenza B by PCR NEGATIVE NEGATIVE Final    Comment: (NOTE) The Xpert Xpress SARS-CoV-2/FLU/RSV plus assay is intended as an aid in the diagnosis of influenza from Nasopharyngeal swab specimens and should not be used as a sole basis for treatment. Nasal washings and aspirates are unacceptable for Xpert Xpress SARS-CoV-2/FLU/RSV testing.  Fact Sheet for Patients: EntrepreneurPulse.com.au  Fact Sheet for Healthcare Providers: IncredibleEmployment.be  This test is not yet  approved or cleared by the [place] FDA and has been authorized for detection and/or diagnosis of SARS-CoV-2 by FDA under an Emergency Use Authorization (EUA). This EUA will remain in effect (meaning this test can be used) for the duration of the COVID-19 declaration under Section 564(b)(1) of the Act, 21 U.S.C. section 360bbb-3(b)(1), unless the authorization is terminated or revoked.  Performed at [facility], [address], [place], [place] [address]   Blood Culture (routine x 2)     Status: Abnormal   Collection Time: 10/26/21  9:56 PM   Specimen: BLOOD  Result Value Ref Range Status   Specimen Description  Final    BLOOD RIGHT FOREARM Performed at [facility], [address] [place], [place] [address]    Special Requests   Final    BOTTLES DRAWN AEROBIC AND ANAEROBIC Blood Culture adequate volume Performed at [facility], [address] [place], [place] [address]    Culture  Setup Time   Final    GRAM POSITIVE COCCI Organism ID to follow ANAEROBIC BOTTLE ONLY CRITICAL RESULT CALLED TO, READ BACK BY AND VERIFIED WITH:    Culture (A)  Final    STAPHYLOCOCCUS EPIDERMIDIS THE SIGNIFICANCE OF ISOLATING THIS ORGANISM FROM A SINGLE SET OF BLOOD CULTURES WHEN MULTIPLE SETS ARE DRAWN IS UNCERTAIN. PLEASE NOTIFY THE MICROBIOLOGY DEPARTMENT WITHIN ONE WEEK IF SPECIATION AND SENSITIVITIES ARE REQUIRED. Performed at [facility], [place] [address] [place], [place] [address]    Report Status 10/29/2021 FINAL  Final  Blood Culture ID Panel (Reflexed)     Status: Abnormal   Collection Time: 10/26/21  9:56 PM  Result Value Ref Range Status   Enterococcus faecalis NOT DETECTED NOT DETECTED Final   Enterococcus Faecium NOT DETECTED NOT DETECTED Final   Listeria monocytogenes NOT DETECTED NOT DETECTED Final   Staphylococcus species DETECTED (A) NOT DETECTED Final    Comment: CRITICAL RESULT CALLED TO, READ BACK BY AND VERIFIED WITH: SUSAN  WATSON 10/27/21 1554 MU    Staphylococcus aureus (BCID) NOT DETECTED NOT DETECTED Final   Staphylococcus epidermidis DETECTED (A) NOT DETECTED Final    Comment: CRITICAL RESULT CALLED TO, READ BACK BY AND VERIFIED WITH: SUSAN WATSON 10/27/21 1554 MU    Staphylococcus lugdunensis NOT DETECTED NOT DETECTED Final   Streptococcus species NOT DETECTED NOT DETECTED Final   Streptococcus agalactiae NOT DETECTED NOT DETECTED Final   Streptococcus pneumoniae NOT DETECTED NOT DETECTED Final   Streptococcus pyogenes NOT DETECTED NOT DETECTED Final   A.calcoaceticus-baumannii NOT DETECTED NOT DETECTED Final   Bacteroides fragilis NOT DETECTED NOT DETECTED Final   Enterobacterales NOT DETECTED NOT DETECTED Final   Enterobacter cloacae complex NOT DETECTED NOT DETECTED Final   Escherichia coli NOT DETECTED NOT DETECTED Final   Klebsiella aerogenes NOT DETECTED NOT DETECTED Final   Klebsiella oxytoca NOT DETECTED NOT DETECTED Final   Klebsiella pneumoniae NOT DETECTED NOT DETECTED Final   Proteus species NOT DETECTED NOT DETECTED Final   Salmonella species NOT DETECTED NOT DETECTED Final   Serratia marcescens NOT DETECTED NOT DETECTED Final   Haemophilus influenzae NOT DETECTED NOT DETECTED Final   Neisseria meningitidis NOT DETECTED NOT DETECTED Final   Pseudomonas aeruginosa NOT DETECTED NOT DETECTED Final   Stenotrophomonas maltophilia NOT DETECTED NOT DETECTED Final   Candida albicans NOT DETECTED NOT DETECTED Final   Candida auris NOT DETECTED NOT DETECTED Final   Candida glabrata NOT DETECTED NOT DETECTED Final   Candida krusei NOT DETECTED NOT DETECTED Final   Candida parapsilosis NOT DETECTED NOT DETECTED Final   Candida tropicalis NOT DETECTED NOT DETECTED Final   Cryptococcus neoformans/gattii NOT DETECTED NOT DETECTED Final   Methicillin resistance mecA/C NOT DETECTED NOT DETECTED Final    Comment: Performed at [facility], [address] [place], [place] [address]  MRSA  Next Gen by PCR, Nasal     Status: None   Collection Time: 10/27/21  2:37 AM   Specimen: Nasal Mucosa; Nasal Swab  Result Value Ref Range Status   MRSA by PCR Next Gen NOT DETECTED NOT DETECTED Final    Comment: (NOTE) The GeneXpert MRSA Assay (FDA approved  for NASAL specimens only), is one component of a comprehensive MRSA colonization surveillance program. It is not intended to diagnose MRSA infection nor to guide or monitor treatment for MRSA infections. Test performance is not FDA approved in patients less than 67 years old. Performed at [facility], [address], [place], [place] [address]       Studies:  No results found.  Assessment & Plan:  55 y.o. female  Schistocytes on peripheral smear- likely related to malignant hypertension/hypertensive emergency. Low risk for TTP. Reticulocytosis continues to improve. MCV improving. ADAMTS 13 pending.  Thrombocytopenia- platelet count nadired to 89,000, now uptrending. PT, PTT normal. Folate was decreased and started on folate supplement. Vitaimin b12 low normal. Started b12 supplement ESRD- managed by nephrology. Started hemodialysis with plan for peritoneal dialysis prior to discharge Normocytic anemia- s/p pRBC transfusion. Etiology related to ESRD.     Verlon Au, NP Medical Oncology [facility]

## 2021-10-31 LAB — CBC
HCT: 26.8 % — ABNORMAL LOW (ref 36.0–46.0)
Hemoglobin: 8.4 g/dL — ABNORMAL LOW (ref 12.0–15.0)
MCH: 30.5 pg (ref 26.0–34.0)
MCHC: 31.3 g/dL (ref 30.0–36.0)
MCV: 97.5 fL (ref 80.0–100.0)
Platelets: 239 10*3/uL (ref 150–400)
RBC: 2.75 MIL/uL — ABNORMAL LOW (ref 3.87–5.11)
RDW: 20.2 % — ABNORMAL HIGH (ref 11.5–15.5)
WBC: 9.1 10*3/uL (ref 4.0–10.5)
nRBC: 0 % (ref 0.0–0.2)

## 2021-10-31 LAB — CULTURE, BLOOD (ROUTINE X 2)
Culture: NO GROWTH
Special Requests: ADEQUATE

## 2021-10-31 LAB — BASIC METABOLIC PANEL
Anion gap: 8 (ref 5–15)
BUN: 40 mg/dL — ABNORMAL HIGH (ref 6–20)
CO2: 25 mmol/L (ref 22–32)
Calcium: 7.4 mg/dL — ABNORMAL LOW (ref 8.9–10.3)
Chloride: 102 mmol/L (ref 98–111)
Creatinine, Ser: 8.64 mg/dL — ABNORMAL HIGH (ref 0.44–1.00)
GFR, Estimated: 5 mL/min — ABNORMAL LOW (ref >60)
Glucose, Bld: 116 mg/dL — ABNORMAL HIGH (ref 70–99)
Potassium: 3 mmol/L — ABNORMAL LOW (ref 3.5–5.1)
Sodium: 135 mmol/L (ref 135–145)

## 2021-10-31 LAB — LACTATE DEHYDROGENASE: LDH: 269 U/L — ABNORMAL HIGH (ref 98–192)

## 2021-10-31 LAB — RETIC PANEL
Immature Retic Fract: 10.4 % (ref 2.3–15.9)
RBC.: 2.64 MIL/uL — ABNORMAL LOW (ref 3.87–5.11)
Retic Count, Absolute: 151.3 10*3/uL (ref 19.0–186.0)
Retic Ct Pct: 5.7 % — ABNORMAL HIGH (ref 0.4–3.1)
Reticulocyte Hemoglobin: 28.9 pg (ref >27.9)

## 2021-10-31 LAB — MAGNESIUM: Magnesium: 1.8 mg/dL (ref 1.7–2.4)

## 2021-10-31 MED ORDER — POTASSIUM CHLORIDE 20 MEQ PO PACK
40.0000 meq | PACK | Freq: Once | ORAL | Status: AC
  Administered 2021-10-31: 40 meq via ORAL
  Filled 2021-10-31: qty 2

## 2021-10-31 NOTE — Progress Notes (Signed)
Patient's morning B/P reading is 154/102; all other V/S WNL and patient is asymptomatic.  Dr. Billie Ruddy notified of B/P elevation.

## 2021-10-31 NOTE — Progress Notes (Addendum)
[facility]  PROGRESS NOTE   Subjective:   Patient seen at bedside.  Today. Last dialysis was on Friday.  Objective:  Vital signs in last 24 hours:  Temp:  [97.8 F (36.6 C)-99.6 F (37.6 C)] 97.8 F (36.6 C) (11/13 0803) Pulse Rate:  [64-92] 64 (11/13 0803) Resp:  [12-20] 12 (11/13 0803) BP: (129-154)/(78-102) 154/102 (11/13 0803) SpO2:  [61 %-100 %] 94 % (11/13 0803) Weight:  [53.3 kg] 53.3 kg (11/13 0420)  Weight change: -6 kg Filed Weights   10/29/21 1203 10/30/21 0500 10/31/21 0420  Weight: 59.3 kg 57.9 kg 53.3 kg    Intake/Output: No intake/output data recorded.   Intake/Output this shift:  No intake/output data recorded.  Physical Exam: General:  No acute distress  Head:  Normocephalic, atraumatic. Moist oral mucosal membranes  Eyes:  Anicteric  Neck:  Supple  Lungs:   Clear to auscultation, normal effort  Heart:  S1S2 no rubs  Abdomen:   Soft, nontender, bowel sounds present  Extremities:  peripheral edema.  Neurologic:  Awake, alert, following commands  Skin:  No lesions  Access:     Basic Metabolic Panel: Recent Labs  Lab 10/27/21 0233 10/27/21 0616 10/27/21 1253 10/28/21 0337 10/28/21 1125 10/30/21 0708 10/31/21 0506  NA 133* 135  --  133* 134* 138 135  K 4.9 4.5  --  3.6 3.7 3.3* 3.0*  CL 100 96*  --  96* 96* 100 102  CO2 10* 13*  --  18* 18* 27 25  GLUCOSE 104* 92  --  104* 107* 106* 116*  BUN 147* 166*  --  111* 99* 29* 40*  CREATININE 22.60* 22.11*  --  16.05* 16.56* 7.02* 8.64*  CALCIUM 7.9* 7.7*  --  7.4* 7.6* 7.7* 7.4*  MG 2.0 1.8  --  1.9  --  1.8 1.8  PHOS  --  12.1* 11.5* 8.7*  --   --   --     CBC: Recent Labs  Lab 10/26/21 2148 10/27/21 0233 10/27/21 0616 10/27/21 1041 10/28/21 1125 10/29/21 0019 10/29/21 0616 10/29/21 1331 10/30/21 0708 10/31/21 0506  WBC 11.6* 9.6 11.4*  --  12.6*  --   --  9.0 10.3 9.1  NEUTROABS 10.7* 8.9*  --   --  11.6*  --   --  7.6 8.7*  --   HGB 6.0* 5.5* 5.1*   < > 8.0*  8.1* 7.9* 8.4* 8.8* 8.4*  HCT 18.1* 15.9* 15.0*   < > 23.2* 24.0* 24.1* 25.4* 26.8* 26.8*  MCV 104.0* 103.9* 96.2  --  90.6  --   --  95.8 98.2 97.5  PLT 123* 89* 90*  --  114*  --   --  157 180 239   < > = values in this interval not displayed.     Urinalysis: No results for input(s): COLORURINE, LABSPEC, PHURINE, GLUCOSEU, HGBUR, BILIRUBINUR, KETONESUR, PROTEINUR, UROBILINOGEN, NITRITE, LEUKOCYTESUR in the last 72 hours.  Invalid input(s): APPERANCEUR    Imaging: No results found.   Medications:    promethazine (PHENERGAN) injection (IM or IVPB)      sodium chloride   Intravenous Once   sodium chloride   Intravenous Once   amLODipine  10 mg Oral Daily   carvedilol  6.25 mg Oral BID WC   Chlorhexidine Gluconate Cloth  6 each Topical Q0600   feeding supplement (NEPRO CARB STEADY)  237 mL Oral BID BM   folic acid  1 mg Oral Daily   losartan  100 mg Oral Daily   melatonin  5 mg Oral QHS   multivitamin  1 tablet Oral QHS   potassium chloride  40 mEq Oral Once   vitamin B-12  1,000 mcg Oral Daily    Assessment/ Plan:     Active Problems:   Acute respiratory failure with hypoxia (HCC)   Acute kidney injury ([place])   Anemia due to folic acid deficiency   B12 deficiency   Thrombocytopenia (HCC)   Anemia in chronic kidney disease, on chronic dialysis [facility])  55 year old female with history of hypertension now with hypertensive urgency, acute kidney injury on the top of chronic kidney disease and was started on hemodialysis.   #1: ESRD: Patient was initiated on dialysis and had 3 treatments so far.  She feels much better at this time. Scheduled for dialysis tomorrow.   #2: Acute respiratory failure: She is now extubated and feels much better.   #3: Malignant hypertension: Blood pressure is much better controlled at this time.  We will continue the amlodipine, carvedilol and losartan at the present doses.   #4: Anemia: Patient is s/p 1 unit of PRBC.  We will continue anemia  protocols.   #5: Secondary hyperparathyroidism: We will continue to monitor closely and start on binders as needed.  #6: Hypokalemia: Supplement potassium today.   Will follow closely.   LOS: [place], [facility] 11/13/20222:07 PM

## 2021-10-31 NOTE — Progress Notes (Signed)
PROGRESS NOTE    Joy Ray  QAS:[id number] DOB: May 15, 1966 DOA: 10/26/2021 PCP: Delsa Grana, PA-C  [id number]   Assessment & Plan:   Active Problems:   Acute respiratory failure with hypoxia (HCC)   Acute kidney injury ([place])   Anemia due to folic acid deficiency   B12 deficiency   Thrombocytopenia (HCC)   Anemia in chronic kidney disease, on chronic dialysis ([place])   Joy Ray is a 55 y.o female with significant PMH of/as below who presented to the ED with chief complaints of progressive shortness of breath x2 days.   She checked her BP at home and noted it to be extremely elevated in the upper 798X systolic with associated mild to moderate headache.  Patient was advised by her PCP to go to the ED for further evaluation.  Patient called EMS today due to worsening symptoms associated with hypoxia and elevated blood pressure of 242/154 mm Hg. She received 10 mg of labetalol on route to the ED and was placed on NRB.   On arrival to the ED, she was hypothermic 94.2 (34.6) with blood pressure 214/125 mm Hg and pulse rate 52 beats/min, RR 28-32 breaths per minute., Sats 56% on NRB.  Stat chest x-ray was obtained and showed diffuse pulmonary edema concerning for flash pulmonary edema with her accelerated hypertension.  She was placed on BiPAP, started on nitro drip follow up by dose of IV Lasix, and nicardipine gtt.   Acute Hypoxic Respiratory Failure secondary to  Flash pulmonary edema  --BNP markedly elevated >45000pg/mL --pt received ceftriaxone and azithromycin x1 dose on presentation for presumed CAP, however, no strong evidence of PNA, so abx not continued. --already weaned down to RA this morning, after dialysis and BP control Plan: --iHD for volume removal   Hypertensive Emergency with Evidence of End Organ Dysfunction (Pulmonary Edema, AKI, Elevated troponin, headache) Severe elevations in blood pressure 214/125 mm Hg on presentation. --off nitro gtt and  nicardipine gtt next morning.  started on amlodipine, coreg and losartan, per nephrology orders Plan: --cont coreg, amlodipine and losartan   Current smoker  Elevated Troponin 2/2 demand ischemia --trop 180's flat.  Demand ischemia in the setting of severe HTN and pulm edema.   Severe Acute Kidney Injury or undiagnosed ESRD --renal US showed Echogenic kidneys compatible with medical renal disease. --started on dialysis Plan: --iHD per nephrology --PD cath placement on Tuesday  High Anion Gap Metabolic Acidosis --2/2 kidney failure and severe uremia    Severe Anemia and thrombocytopenia --Hgb 6.1, plt 137 on presentation. --s/p 2u pRBC.  Hgb stable ~8. Plan: --monitor Hgb   DVT prophylaxis: SCD/Compression stockings Code Status: Full code  Family Communication:  Level of care: Telemetry Medical Dispo:   The patient is from: home Anticipated d/c is to: home Anticipated d/c date is: 2-3 days Patient currently is not medically ready to d/c due to: newly ESRD, need PD cath placement    Subjective and Interval History:  Pt reported eating a little bit of each meal.  No pain.  No new complaints.   Objective: Vitals:   10/30/21 2133 10/31/21 0417 10/31/21 0420 10/31/21 0803  BP: (!) 148/81 (!) 153/94  (!) 154/102  Pulse: 84 86  64  Resp: 20 20  12   Temp: 98.2 F (36.8 C) 99.6 F (37.6 C)  97.8 F (36.6 C)  TempSrc: Oral Oral  Oral  SpO2: 100% 100%  94%  Weight:   53.3 kg   Height:  No intake or output data in the 24 hours ending 10/31/21 1505  Filed Weights   10/29/21 1203 10/30/21 0500 10/31/21 0420  Weight: 59.3 kg 57.9 kg 53.3 kg    Examination:   Constitutional: NAD, AAOx3 HEENT: conjunctivae and lids normal, EOMI CV: No cyanosis.   RESP: normal respiratory effort, on RA Extremities: No effusions, edema in BLE SKIN: warm, dry Neuro: II - XII grossly intact.   Psych: Normal mood and affect.  Appropriate judgement and reason   Data Reviewed: I  have personally reviewed following labs and imaging studies  CBC: Recent Labs  Lab 10/26/21 2148 10/27/21 0233 10/27/21 0616 10/27/21 1041 10/28/21 1125 10/29/21 0019 10/29/21 0616 10/29/21 1331 10/30/21 0708 10/31/21 0506  WBC 11.6* 9.6 11.4*  --  12.6*  --   --  9.0 10.3 9.1  NEUTROABS 10.7* 8.9*  --   --  11.6*  --   --  7.6 8.7*  --   HGB 6.0* 5.5* 5.1*   < > 8.0* 8.1* 7.9* 8.4* 8.8* 8.4*  HCT 18.1* 15.9* 15.0*   < > 23.2* 24.0* 24.1* 25.4* 26.8* 26.8*  MCV 104.0* 103.9* 96.2  --  90.6  --   --  95.8 98.2 97.5  PLT 123* 89* 90*  --  114*  --   --  157 180 239   < > = values in this interval not displayed.   Basic Metabolic Panel: Recent Labs  Lab 10/27/21 0233 10/27/21 0616 10/27/21 1253 10/28/21 0337 10/28/21 1125 10/30/21 0708 10/31/21 0506  NA 133* 135  --  133* 134* 138 135  K 4.9 4.5  --  3.6 3.7 3.3* 3.0*  CL 100 96*  --  96* 96* 100 102  CO2 10* 13*  --  18* 18* 27 25  GLUCOSE 104* 92  --  104* 107* 106* 116*  BUN 147* 166*  --  111* 99* 29* 40*  CREATININE 22.60* 22.11*  --  16.05* 16.56* 7.02* 8.64*  CALCIUM 7.9* 7.7*  --  7.4* 7.6* 7.7* 7.4*  MG 2.0 1.8  --  1.9  --  1.8 1.8  PHOS  --  12.1* 11.5* 8.7*  --   --   --    GFR: Estimated Creatinine Clearance: 6.3 mL/min (A) (by C-G formula based on SCr of 8.64 mg/dL (H)). Liver Function Tests: Recent Labs  Lab 10/26/21 2148 10/27/21 0233 10/28/21 0337 10/28/21 1125  AST 19 16  --  17  ALT 8 9  --  14  ALKPHOS 35* 34*  --  32*  BILITOT 0.9 0.9  --  0.9  PROT 5.8* 5.4*  --  5.3*  ALBUMIN 3.2* 2.7* 2.9* 2.8*   No results for input(s): LIPASE, AMYLASE in the last 168 hours. No results for input(s): AMMONIA in the last 168 hours. Coagulation Profile: Recent Labs  Lab 10/26/21 2148 10/28/21 1125  INR 1.2 1.1   Cardiac Enzymes: No results for input(s): CKTOTAL, CKMB, CKMBINDEX, TROPONINI in the last 168 hours. BNP (last 3 results) No results for input(s): PROBNP in the last 8760  hours. HbA1C: No results for input(s): HGBA1C in the last 72 hours.  CBG: Recent Labs  Lab 10/27/21 0212 10/27/21 1956  GLUCAP 94 103*   Lipid Profile: No results for input(s): CHOL, HDL, LDLCALC, TRIG, CHOLHDL, LDLDIRECT in the last 72 hours.  Thyroid Function Tests: No results for input(s): TSH, T4TOTAL, FREET4, T3FREE, THYROIDAB in the last 72 hours.  Anemia Panel: Recent Labs  10/29/21 1331 10/31/21 0506  RETICCTPCT 7.1* 5.7*   Sepsis Labs: Recent Labs  Lab 10/26/21 2148 10/27/21 0022  PROCALCITON  --  1.49  LATICACIDVEN 3.9* 1.9    Recent Results (from the past 240 hour(s))  Blood Culture (routine x 2)     Status: None   Collection Time: 10/26/21  9:48 PM   Specimen: BLOOD  Result Value Ref Range Status   Specimen Description BLOOD LEFT FOREARM  Final   Special Requests   Final    BOTTLES DRAWN AEROBIC AND ANAEROBIC Blood Culture adequate volume   Culture   Final    NO GROWTH 5 DAYS Performed at [facility], [address] [place], [place] [address]    Report Status 10/31/2021 FINAL  Final  Resp Panel by RT-PCR (Flu A&B, Covid) Nasopharyngeal Swab     Status: None   Collection Time: 10/26/21  9:56 PM   Specimen: Nasopharyngeal Swab; Nasopharyngeal(NP) swabs in vial transport medium  Result Value Ref Range Status   SARS Coronavirus 2 by RT PCR NEGATIVE NEGATIVE Final    Comment: (NOTE) SARS-CoV-2 target nucleic acids are NOT DETECTED.  The SARS-CoV-2 RNA is generally detectable in upper respiratory specimens during the acute phase of infection. The lowest concentration of SARS-CoV-2 viral copies this assay can detect is 138 copies/mL. A negative result does not preclude SARS-Cov-2 infection and should not be used as the sole basis for treatment or other patient management decisions. A negative result may occur with  improper specimen collection/handling, submission of specimen other than nasopharyngeal swab, presence of viral mutation(s)  within the areas targeted by this assay, and inadequate number of viral copies(<138 copies/mL). A negative result must be combined with clinical observations, patient history, and epidemiological information. The expected result is Negative.  Fact Sheet for Patients:  EntrepreneurPulse.com.au  Fact Sheet for Healthcare Providers:  IncredibleEmployment.be  This test is no t yet approved or cleared by the [place] FDA and  has been authorized for detection and/or diagnosis of SARS-CoV-2 by FDA under an Emergency Use Authorization (EUA). This EUA will remain  in effect (meaning this test can be used) for the duration of the COVID-19 declaration under Section 564(b)(1) of the Act, 21 U.S.C.section 360bbb-3(b)(1), unless the authorization is terminated  or revoked sooner.       Influenza A by PCR NEGATIVE NEGATIVE Final   Influenza B by PCR NEGATIVE NEGATIVE Final    Comment: (NOTE) The Xpert Xpress SARS-CoV-2/FLU/RSV plus assay is intended as an aid in the diagnosis of influenza from Nasopharyngeal swab specimens and should not be used as a sole basis for treatment. Nasal washings and aspirates are unacceptable for Xpert Xpress SARS-CoV-2/FLU/RSV testing.  Fact Sheet for Patients: EntrepreneurPulse.com.au  Fact Sheet for Healthcare Providers: IncredibleEmployment.be  This test is not yet approved or cleared by the [place] FDA and has been authorized for detection and/or diagnosis of SARS-CoV-2 by FDA under an Emergency Use Authorization (EUA). This EUA will remain in effect (meaning this test can be used) for the duration of the COVID-19 declaration under Section 564(b)(1) of the Act, 21 U.S.C. section 360bbb-3(b)(1), unless the authorization is terminated or revoked.  Performed at [facility], [address] [place], [place] [address]   Blood Culture (routine x 2)     Status:  Abnormal   Collection Time: 10/26/21  9:56 PM   Specimen: BLOOD  Result Value Ref Range Status   Specimen Description   Final    BLOOD RIGHT  FOREARM Performed at [facility], [address] [place], [place] [address]    Special Requests   Final    BOTTLES DRAWN AEROBIC AND ANAEROBIC Blood Culture adequate volume Performed at [facility], [address] [place], [place] [address]    Culture  Setup Time   Final    GRAM POSITIVE COCCI Organism ID to follow ANAEROBIC BOTTLE ONLY CRITICAL RESULT CALLED TO, READ BACK BY AND VERIFIED WITH:    Culture (A)  Final    STAPHYLOCOCCUS EPIDERMIDIS THE SIGNIFICANCE OF ISOLATING THIS ORGANISM FROM A SINGLE SET OF BLOOD CULTURES WHEN MULTIPLE SETS ARE DRAWN IS UNCERTAIN. PLEASE NOTIFY THE MICROBIOLOGY DEPARTMENT WITHIN ONE WEEK IF SPECIATION AND SENSITIVITIES ARE REQUIRED. Performed at [facility], [place] [address] [place], [place] [address]    Report Status 10/29/2021 FINAL  Final  Blood Culture ID Panel (Reflexed)     Status: Abnormal   Collection Time: 10/26/21  9:56 PM  Result Value Ref Range Status   Enterococcus faecalis NOT DETECTED NOT DETECTED Final   Enterococcus Faecium NOT DETECTED NOT DETECTED Final   Listeria monocytogenes NOT DETECTED NOT DETECTED Final   Staphylococcus species DETECTED (A) NOT DETECTED Final    Comment: CRITICAL RESULT CALLED TO, READ BACK BY AND VERIFIED WITH: SUSAN WATSON 10/27/21 1554 MU    Staphylococcus aureus (BCID) NOT DETECTED NOT DETECTED Final   Staphylococcus epidermidis DETECTED (A) NOT DETECTED Final    Comment: CRITICAL RESULT CALLED TO, READ BACK BY AND VERIFIED WITH: SUSAN WATSON 10/27/21 1554 MU    Staphylococcus lugdunensis NOT DETECTED NOT DETECTED Final   Streptococcus species NOT DETECTED NOT DETECTED Final   Streptococcus agalactiae NOT DETECTED NOT DETECTED Final   Streptococcus pneumoniae NOT DETECTED NOT DETECTED Final   Streptococcus pyogenes NOT DETECTED  NOT DETECTED Final   A.calcoaceticus-baumannii NOT DETECTED NOT DETECTED Final   Bacteroides fragilis NOT DETECTED NOT DETECTED Final   Enterobacterales NOT DETECTED NOT DETECTED Final   Enterobacter cloacae complex NOT DETECTED NOT DETECTED Final   Escherichia coli NOT DETECTED NOT DETECTED Final   Klebsiella aerogenes NOT DETECTED NOT DETECTED Final   Klebsiella oxytoca NOT DETECTED NOT DETECTED Final   Klebsiella pneumoniae NOT DETECTED NOT DETECTED Final   Proteus species NOT DETECTED NOT DETECTED Final   Salmonella species NOT DETECTED NOT DETECTED Final   Serratia marcescens NOT DETECTED NOT DETECTED Final   Haemophilus influenzae NOT DETECTED NOT DETECTED Final   Neisseria meningitidis NOT DETECTED NOT DETECTED Final   Pseudomonas aeruginosa NOT DETECTED NOT DETECTED Final   Stenotrophomonas maltophilia NOT DETECTED NOT DETECTED Final   Candida albicans NOT DETECTED NOT DETECTED Final   Candida auris NOT DETECTED NOT DETECTED Final   Candida glabrata NOT DETECTED NOT DETECTED Final   Candida krusei NOT DETECTED NOT DETECTED Final   Candida parapsilosis NOT DETECTED NOT DETECTED Final   Candida tropicalis NOT DETECTED NOT DETECTED Final   Cryptococcus neoformans/gattii NOT DETECTED NOT DETECTED Final   Methicillin resistance mecA/C NOT DETECTED NOT DETECTED Final    Comment: Performed at [facility], [address] [place], [place] [address]  MRSA Next Gen by PCR, Nasal     Status: None   Collection Time: 10/27/21  2:37 AM   Specimen: Nasal Mucosa; Nasal Swab  Result Value Ref Range Status   MRSA by PCR Next Gen NOT DETECTED NOT DETECTED Final    Comment: (NOTE) The GeneXpert MRSA Assay (FDA approved for NASAL specimens only), is one component  of a comprehensive MRSA colonization surveillance program. It is not intended to diagnose MRSA infection nor to guide or monitor treatment for MRSA infections. Test performance is not FDA approved in patients less than 44  years old. Performed at [facility], [address] [place], [place] [address]       Radiology Studies: No results found.   Scheduled Meds:  sodium chloride   Intravenous Once   sodium chloride   Intravenous Once   amLODipine  10 mg Oral Daily   carvedilol  6.25 mg Oral BID WC   Chlorhexidine Gluconate Cloth  6 each Topical Q0600   feeding supplement (NEPRO CARB STEADY)  237 mL Oral BID BM   folic acid  1 mg Oral Daily   losartan  100 mg Oral Daily   melatonin  5 mg Oral QHS   multivitamin  1 tablet Oral QHS   potassium chloride  40 mEq Oral Once   vitamin B-12  1,000 mcg Oral Daily   Continuous Infusions:  promethazine (PHENERGAN) injection (IM or IVPB)       LOS: 5 days     Enzo Bi, MD [facility] If 7PM-7AM, please contact night-coverage 10/31/2021, 3:05 PM

## 2021-10-31 NOTE — Plan of Care (Signed)
Continuing with plan of care. 

## 2021-11-01 DIAGNOSIS — D529 Folate deficiency anemia, unspecified: Secondary | ICD-10-CM | POA: Diagnosis not present

## 2021-11-01 DIAGNOSIS — E538 Deficiency of other specified B group vitamins: Secondary | ICD-10-CM | POA: Diagnosis not present

## 2021-11-01 DIAGNOSIS — E43 Unspecified severe protein-calorie malnutrition: Secondary | ICD-10-CM | POA: Insufficient documentation

## 2021-11-01 DIAGNOSIS — D696 Thrombocytopenia, unspecified: Secondary | ICD-10-CM | POA: Diagnosis not present

## 2021-11-01 DIAGNOSIS — N186 End stage renal disease: Secondary | ICD-10-CM | POA: Diagnosis not present

## 2021-11-01 LAB — CBC
HCT: 26.2 % — ABNORMAL LOW (ref 36.0–46.0)
Hemoglobin: 8.6 g/dL — ABNORMAL LOW (ref 12.0–15.0)
MCH: 31.6 pg (ref 26.0–34.0)
MCHC: 32.8 g/dL (ref 30.0–36.0)
MCV: 96.3 fL (ref 80.0–100.0)
Platelets: 248 10*3/uL (ref 150–400)
RBC: 2.72 MIL/uL — ABNORMAL LOW (ref 3.87–5.11)
RDW: 19.7 % — ABNORMAL HIGH (ref 11.5–15.5)
WBC: 7.6 10*3/uL (ref 4.0–10.5)
nRBC: 0 % (ref 0.0–0.2)

## 2021-11-01 LAB — BASIC METABOLIC PANEL
Anion gap: 17 — ABNORMAL HIGH (ref 5–15)
BUN: 51 mg/dL — ABNORMAL HIGH (ref 6–20)
CO2: 18 mmol/L — ABNORMAL LOW (ref 22–32)
Calcium: 8.6 mg/dL — ABNORMAL LOW (ref 8.9–10.3)
Chloride: 105 mmol/L (ref 98–111)
Creatinine, Ser: 10.9 mg/dL — ABNORMAL HIGH (ref 0.44–1.00)
GFR, Estimated: 4 mL/min — ABNORMAL LOW (ref >60)
Glucose, Bld: 97 mg/dL (ref 70–99)
Potassium: 4.8 mmol/L (ref 3.5–5.1)
Sodium: 140 mmol/L (ref 135–145)

## 2021-11-01 LAB — RETIC PANEL
Immature Retic Fract: 6.5 % (ref 2.3–15.9)
RBC.: 2.75 MIL/uL — ABNORMAL LOW (ref 3.87–5.11)
Retic Count, Absolute: 140.5 10*3/uL (ref 19.0–186.0)
Retic Ct Pct: 5.1 % — ABNORMAL HIGH (ref 0.4–3.1)
Reticulocyte Hemoglobin: 28.7 pg (ref >27.9)

## 2021-11-01 LAB — LACTATE DEHYDROGENASE: LDH: 354 U/L — ABNORMAL HIGH (ref 98–192)

## 2021-11-01 LAB — MAGNESIUM: Magnesium: 1.9 mg/dL (ref 1.7–2.4)

## 2021-11-01 MED ORDER — CEFAZOLIN SODIUM-DEXTROSE 1-4 GM/50ML-% IV SOLN
1.0000 g | INTRAVENOUS | Status: AC
  Administered 2021-11-02: 1 g via INTRAVENOUS

## 2021-11-01 MED ORDER — CHLORHEXIDINE GLUCONATE CLOTH 2 % EX PADS: 6.0000 | MEDICATED_PAD | Freq: Every day | CUTANEOUS | Status: DC

## 2021-11-01 MED ORDER — CEFAZOLIN (ANCEF) 1 G IV SOLR: 1.0000 g | INTRAVENOUS | Status: DC

## 2021-11-01 MED ORDER — BOOST / RESOURCE BREEZE PO LIQD CUSTOM
1.0000 | Freq: Three times a day (TID) | ORAL | Status: DC
  Administered 2021-11-01: 1 via ORAL

## 2021-11-01 MED ORDER — HEPARIN SODIUM (PORCINE) 1000 UNIT/ML IJ SOLN
INTRAMUSCULAR | Status: AC
  Filled 2021-11-01: qty 1

## 2021-11-01 NOTE — Progress Notes (Signed)
Joy Ray   DOB:Feb 02, 1966   SW#:[id number]   GYK#:[id number]  Subjective: Patient seen in hemodialysis. Tolerating well. Feels much better. Eager to go home.    Objective:  Vitals:   11/01/21 1300 11/01/21 1325  BP: (!) 159/88 (!) 174/92  Pulse:  77  Resp: (!) 23 18  Temp: 98.9 F (37.2 C) 98.4 F (36.9 C)  SpO2: 100% 98%    Body mass index is 17.87 kg/m.  Intake/Output Summary (Last 24 hours) at 11/01/2021 1515 Last data filed at 11/01/2021 1300 Gross per 24 hour  Intake --  Output 500 ml  Net -500 ml    General: Well-developed. Thin build. In hospital bed.  Lungs: No audible wheezing or coughing Heart: Regular rate and rhythm.  Abdomen: Soft, nontender, nondistended.  Musculoskeletal: No edema, cyanosis, or clubbing. Neuro: Alert, answering all questions appropriately. Cranial nerves grossly intact. Skin: No rashes or petechiae noted. Psych: Normal affect.  Labs:  Lab Results  Component Value Date   WBC 7.6 11/01/2021   HGB 8.6 (L) 11/01/2021   HCT 26.2 (L) 11/01/2021   MCV 96.3 11/01/2021   PLT 248 11/01/2021   NEUTROABS 8.7 (H) 79/39/0300    Basic Metabolic Panel: Recent Labs  Lab 10/27/21 0616 10/27/21 1253 10/28/21 0337 10/28/21 1125 10/30/21 0708 10/31/21 0506 11/01/21 0711  NA 135  --  133* 134* 138 135 140  K 4.5  --  3.6 3.7 3.3* 3.0* 4.8  CL 96*  --  96* 96* 100 102 105  CO2 13*  --  18* 18* 27 25 18*  GLUCOSE 92  --  104* 107* 106* 116* 97  BUN 166*  --  111* 99* 29* 40* 51*  CREATININE 22.11*  --  16.05* 16.56* 7.02* 8.64* 10.90*  CALCIUM 7.7*  --  7.4* 7.6* 7.7* 7.4* 8.6*  MG 1.8  --  1.9  --  1.8 1.8 1.9  PHOS 12.1* 11.5* 8.7*  --   --   --   --     GFR Estimated Creatinine Clearance: 5 mL/min (A) (by C-G formula based on SCr of 10.9 mg/dL (H)). Liver Function Tests: Recent Labs  Lab 10/26/21 2148 10/27/21 0233 10/28/21 0337 10/28/21 1125  AST 19 16  --  17  ALT 8 9  --  14  ALKPHOS 35* 34*  --  32*  BILITOT 0.9  0.9  --  0.9  PROT 5.8* 5.4*  --  5.3*  ALBUMIN 3.2* 2.7* 2.9* 2.8*    Coagulation profile Recent Labs  Lab 10/26/21 2148 10/28/21 1125  INR 1.2 1.1     CBC: Recent Labs  Lab 10/26/21 2148 10/27/21 0233 10/27/21 0616 10/28/21 1125 10/29/21 0019 10/29/21 0616 10/29/21 1331 10/30/21 0708 10/31/21 0506 11/01/21 0711  WBC 11.6* 9.6   < > 12.6*  --   --  9.0 10.3 9.1 7.6  NEUTROABS 10.7* 8.9*  --  11.6*  --   --  7.6 8.7*  --   --   HGB 6.0* 5.5*   < > 8.0*   < > 7.9* 8.4* 8.8* 8.4* 8.6*  HCT 18.1* 15.9*   < > 23.2*   < > 24.1* 25.4* 26.8* 26.8* 26.2*  MCV 104.0* 103.9*   < > 90.6  --   --  95.8 98.2 97.5 96.3  PLT 123* 89*   < > 114*  --   --  157 180 239 248   < > = values in this interval not displayed.  Anemia work up Recent Labs    10/31/21 0506 11/01/21 0845  RETICCTPCT 5.7* 5.1*    Microbiology Recent Results (from the past 240 hour(s))  Blood Culture (routine x 2)     Status: None   Collection Time: 10/26/21  9:48 PM   Specimen: BLOOD  Result Value Ref Range Status   Specimen Description BLOOD LEFT FOREARM  Final   Special Requests   Final    BOTTLES DRAWN AEROBIC AND ANAEROBIC Blood Culture adequate volume   Culture   Final    NO GROWTH 5 DAYS Performed at [facility], [address] [place], [place] [address]    Report Status 10/31/2021 FINAL  Final  Resp Panel by RT-PCR (Flu A&B, Covid) Nasopharyngeal Swab     Status: None   Collection Time: 10/26/21  9:56 PM   Specimen: Nasopharyngeal Swab; Nasopharyngeal(NP) swabs in vial transport medium  Result Value Ref Range Status   SARS Coronavirus 2 by RT PCR NEGATIVE NEGATIVE Final    Comment: (NOTE) SARS-CoV-2 target nucleic acids are NOT DETECTED.  The SARS-CoV-2 RNA is generally detectable in upper respiratory specimens during the acute phase of infection. The lowest concentration of SARS-CoV-2 viral copies this assay can detect is 138 copies/mL. A negative result does not preclude  SARS-Cov-2 infection and should not be used as the sole basis for treatment or other patient management decisions. A negative result may occur with  improper specimen collection/handling, submission of specimen other than nasopharyngeal swab, presence of viral mutation(s) within the areas targeted by this assay, and inadequate number of viral copies(<138 copies/mL). A negative result must be combined with clinical observations, patient history, and epidemiological information. The expected result is Negative.  Fact Sheet for Patients:  EntrepreneurPulse.com.au  Fact Sheet for Healthcare Providers:  IncredibleEmployment.be  This test is no t yet approved or cleared by the [place] FDA and  has been authorized for detection and/or diagnosis of SARS-CoV-2 by FDA under an Emergency Use Authorization (EUA). This EUA will remain  in effect (meaning this test can be used) for the duration of the COVID-19 declaration under Section 564(b)(1) of the Act, 21 U.S.C.section 360bbb-3(b)(1), unless the authorization is terminated  or revoked sooner.       Influenza A by PCR NEGATIVE NEGATIVE Final   Influenza B by PCR NEGATIVE NEGATIVE Final    Comment: (NOTE) The Xpert Xpress SARS-CoV-2/FLU/RSV plus assay is intended as an aid in the diagnosis of influenza from Nasopharyngeal swab specimens and should not be used as a sole basis for treatment. Nasal washings and aspirates are unacceptable for Xpert Xpress SARS-CoV-2/FLU/RSV testing.  Fact Sheet for Patients: EntrepreneurPulse.com.au  Fact Sheet for Healthcare Providers: IncredibleEmployment.be  This test is not yet approved or cleared by the [place] FDA and has been authorized for detection and/or diagnosis of SARS-CoV-2 by FDA under an Emergency Use Authorization (EUA). This EUA will remain in effect (meaning this test can be used) for the duration of  the COVID-19 declaration under Section 564(b)(1) of the Act, 21 U.S.C. section 360bbb-3(b)(1), unless the authorization is terminated or revoked.  Performed at [facility], [address] [place], [place] [address]   Blood Culture (routine x 2)     Status: Abnormal   Collection Time: 10/26/21  9:56 PM   Specimen: BLOOD  Result Value Ref Range Status   Specimen Description   Final    BLOOD RIGHT FOREARM Performed at [facility], [address] [place],  [address]  Special Requests   Final    BOTTLES DRAWN AEROBIC AND ANAEROBIC Blood Culture adequate volume Performed at [facility], [address] [place], [place] [address]    Culture  Setup Time   Final    GRAM POSITIVE COCCI Organism ID to follow ANAEROBIC BOTTLE ONLY CRITICAL RESULT CALLED TO, READ BACK BY AND VERIFIED WITH:    Culture (A)  Final    STAPHYLOCOCCUS EPIDERMIDIS THE SIGNIFICANCE OF ISOLATING THIS ORGANISM FROM A SINGLE SET OF BLOOD CULTURES WHEN MULTIPLE SETS ARE DRAWN IS UNCERTAIN. PLEASE NOTIFY THE MICROBIOLOGY DEPARTMENT WITHIN ONE WEEK IF SPECIATION AND SENSITIVITIES ARE REQUIRED. Performed at [facility], [place] [address] [place], [place] [address]    Report Status 10/29/2021 FINAL  Final  Blood Culture ID Panel (Reflexed)     Status: Abnormal   Collection Time: 10/26/21  9:56 PM  Result Value Ref Range Status   Enterococcus faecalis NOT DETECTED NOT DETECTED Final   Enterococcus Faecium NOT DETECTED NOT DETECTED Final   Listeria monocytogenes NOT DETECTED NOT DETECTED Final   Staphylococcus species DETECTED (A) NOT DETECTED Final    Comment: CRITICAL RESULT CALLED TO, READ BACK BY AND VERIFIED WITH: SUSAN WATSON 10/27/21 1554 MU    Staphylococcus aureus (BCID) NOT DETECTED NOT DETECTED Final   Staphylococcus epidermidis DETECTED (A) NOT DETECTED Final    Comment: CRITICAL RESULT CALLED TO, READ BACK BY AND VERIFIED WITH: SUSAN WATSON 10/27/21 1554 MU     Staphylococcus lugdunensis NOT DETECTED NOT DETECTED Final   Streptococcus species NOT DETECTED NOT DETECTED Final   Streptococcus agalactiae NOT DETECTED NOT DETECTED Final   Streptococcus pneumoniae NOT DETECTED NOT DETECTED Final   Streptococcus pyogenes NOT DETECTED NOT DETECTED Final   A.calcoaceticus-baumannii NOT DETECTED NOT DETECTED Final   Bacteroides fragilis NOT DETECTED NOT DETECTED Final   Enterobacterales NOT DETECTED NOT DETECTED Final   Enterobacter cloacae complex NOT DETECTED NOT DETECTED Final   Escherichia coli NOT DETECTED NOT DETECTED Final   Klebsiella aerogenes NOT DETECTED NOT DETECTED Final   Klebsiella oxytoca NOT DETECTED NOT DETECTED Final   Klebsiella pneumoniae NOT DETECTED NOT DETECTED Final   Proteus species NOT DETECTED NOT DETECTED Final   Salmonella species NOT DETECTED NOT DETECTED Final   Serratia marcescens NOT DETECTED NOT DETECTED Final   Haemophilus influenzae NOT DETECTED NOT DETECTED Final   Neisseria meningitidis NOT DETECTED NOT DETECTED Final   Pseudomonas aeruginosa NOT DETECTED NOT DETECTED Final   Stenotrophomonas maltophilia NOT DETECTED NOT DETECTED Final   Candida albicans NOT DETECTED NOT DETECTED Final   Candida auris NOT DETECTED NOT DETECTED Final   Candida glabrata NOT DETECTED NOT DETECTED Final   Candida krusei NOT DETECTED NOT DETECTED Final   Candida parapsilosis NOT DETECTED NOT DETECTED Final   Candida tropicalis NOT DETECTED NOT DETECTED Final   Cryptococcus neoformans/gattii NOT DETECTED NOT DETECTED Final   Methicillin resistance mecA/C NOT DETECTED NOT DETECTED Final    Comment: Performed at [facility], [address] [place], [place] [address]  MRSA Next Gen by PCR, Nasal     Status: None   Collection Time: 10/27/21  2:37 AM   Specimen: Nasal Mucosa; Nasal Swab  Result Value Ref Range Status   MRSA by PCR Next Gen NOT DETECTED NOT DETECTED Final    Comment: (NOTE) The GeneXpert MRSA Assay (FDA  approved for NASAL specimens only), is one component of a comprehensive MRSA colonization surveillance program. It is not intended to diagnose MRSA infection  nor to guide or monitor treatment for MRSA infections. Test performance is not FDA approved in patients less than 63 years old. Performed at [facility], [address], [place], [place] [address]     Studies:  No results found.  Assessment & Plan:  55 y.o. female  Schistocytes on peripheral smear- likely related to malignant hypertension/hypertensive emergency. [id number] reduced but not consistent with TTP. Reticulocytosis improving. MCV improving.  Thrombocytopenia- platelet count nadired to 89,000, now normal. PT, PTT normal. Folate was decreased and started on folate supplement. Vitaimin b12 low normal. Started b12 supplement ESRD- managed by nephrology. Started hemodialysis with plan for peritoneal dialysis prior to discharge. She is scheduled for insertion of peritoneal dialysis catheter.  Normocytic anemia- s/p pRBC transfusion. Etiology related to ESRD.  Anemia- likely related to ESRD. On folate and b12. For deficiencies.    Verlon Au, NP Medical Oncology and H[facility]

## 2021-11-01 NOTE — Progress Notes (Signed)
PROGRESS NOTE    Joy Ray  IOE:[id number] DOB: 1966-08-22 DOA: 10/26/2021 PCP: Delsa Grana, PA-C  [id number]   Assessment & Plan:   Active Problems:   Acute respiratory failure with hypoxia (HCC)   Acute kidney injury ([place])   Anemia due to folic acid deficiency   B12 deficiency   Thrombocytopenia (HCC)   Anemia in chronic kidney disease, on chronic dialysis ([place])   Joy Ray is a 55 y.o female with significant PMH of/as below who presented to the ED with chief complaints of progressive shortness of breath x2 days.   She checked her BP at home and noted it to be extremely elevated in the upper 182X systolic with associated mild to moderate headache.  Patient was advised by her PCP to go to the ED for further evaluation.  Patient called EMS today due to worsening symptoms associated with hypoxia and elevated blood pressure of 242/154 mm Hg. She received 10 mg of labetalol on route to the ED and was placed on NRB.   On arrival to the ED, she was hypothermic 94.2 (34.6) with blood pressure 214/125 mm Hg and pulse rate 52 beats/min, RR 28-32 breaths per minute., Sats 56% on NRB.  Stat chest x-ray was obtained and showed diffuse pulmonary edema concerning for flash pulmonary edema with her accelerated hypertension.  She was placed on BiPAP, started on nitro drip follow up by dose of IV Lasix, and nicardipine gtt.   Acute Hypoxic Respiratory Failure secondary to  Flash pulmonary edema  --BNP markedly elevated >45000pg/mL --pt received ceftriaxone and azithromycin x1 dose on presentation for presumed CAP, however, no strong evidence of PNA, so abx not continued. --already weaned down to RA this morning, after dialysis and BP control Plan: --iHD for volume removal   Hypertensive Emergency with Evidence of End Organ Dysfunction (Pulmonary Edema, AKI, Elevated troponin, headache) Severe elevations in blood pressure 214/125 mm Hg on presentation. --off nitro gtt and  nicardipine gtt next morning.  started on amlodipine, coreg and losartan, per nephrology orders Plan: --cont coreg, amlodipine and losartan   Current smoker  Elevated Troponin 2/2 demand ischemia --trop 180's flat.  Demand ischemia in the setting of severe HTN and pulm edema.   Severe Acute Kidney Injury or undiagnosed ESRD --renal US showed Echogenic kidneys compatible with medical renal disease. --started on dialysis Plan: --iHD per nephrology --PD cath placement on Tuesday  High Anion Gap Metabolic Acidosis --2/2 kidney failure and severe uremia    Severe Anemia and thrombocytopenia --Hgb 6.1, plt 137 on presentation. --s/p 2u pRBC.  Hgb stable ~8. --Schistocytes on smear most likely secondary to malignant hypertension. Plan: --monitor Hgb  Severe malnutrition in context of chronic illness --supplements per dietician    DVT prophylaxis: SCD/Compression stockings Code Status: Full code  Family Communication:  Level of care: Telemetry Medical Dispo:   The patient is from: home Anticipated d/c is to: home Anticipated d/c date is: tomorrow Patient currently is not medically ready to d/c due to: newly ESRD, need PD cath placement    Subjective and Interval History:  Pt reported eating better.  Had dialysis today.   Objective: Vitals:   11/01/21 1300 11/01/21 1315 11/01/21 1325 11/01/21 1515  BP: (!) 159/88  (!) 174/92 124/77  Pulse:   77 84  Resp: (!) 23  18 16   Temp: 98.9 F (37.2 C)  98.4 F (36.9 C) 98.2 F (36.8 C)  TempSrc: Oral  Oral Oral  SpO2: 100%  98% 92%  Weight:  53.3 kg    Height:        Intake/Output Summary (Last 24 hours) at 11/01/2021 1607 Last data filed at 11/01/2021 1300 Gross per 24 hour  Intake 120 ml  Output 500 ml  Net -380 ml    Filed Weights   10/31/21 0420 11/01/21 0930 11/01/21 1315  Weight: 53.3 kg 53.7 kg 53.3 kg    Examination:   Constitutional: NAD, AAOx3 HEENT: conjunctivae and lids normal, EOMI CV: No  cyanosis.   RESP: normal respiratory effort, on RA Extremities: No effusions, edema in BLE SKIN: warm, dry Neuro: II - XII grossly intact.   Psych: Normal mood and affect.  Appropriate judgement and reason   Data Reviewed: I have personally reviewed following labs and imaging studies  CBC: Recent Labs  Lab 10/26/21 2148 10/27/21 0233 10/27/21 0616 10/28/21 1125 10/29/21 0019 10/29/21 0616 10/29/21 1331 10/30/21 0708 10/31/21 0506 11/01/21 0711  WBC 11.6* 9.6   < > 12.6*  --   --  9.0 10.3 9.1 7.6  NEUTROABS 10.7* 8.9*  --  11.6*  --   --  7.6 8.7*  --   --   HGB 6.0* 5.5*   < > 8.0*   < > 7.9* 8.4* 8.8* 8.4* 8.6*  HCT 18.1* 15.9*   < > 23.2*   < > 24.1* 25.4* 26.8* 26.8* 26.2*  MCV 104.0* 103.9*   < > 90.6  --   --  95.8 98.2 97.5 96.3  PLT 123* 89*   < > 114*  --   --  157 180 239 248   < > = values in this interval not displayed.   Basic Metabolic Panel: Recent Labs  Lab 10/27/21 0616 10/27/21 1253 10/28/21 0337 10/28/21 1125 10/30/21 0708 10/31/21 0506 11/01/21 0711  NA 135  --  133* 134* 138 135 140  K 4.5  --  3.6 3.7 3.3* 3.0* 4.8  CL 96*  --  96* 96* 100 102 105  CO2 13*  --  18* 18* 27 25 18*  GLUCOSE 92  --  104* 107* 106* 116* 97  BUN 166*  --  111* 99* 29* 40* 51*  CREATININE 22.11*  --  16.05* 16.56* 7.02* 8.64* 10.90*  CALCIUM 7.7*  --  7.4* 7.6* 7.7* 7.4* 8.6*  MG 1.8  --  1.9  --  1.8 1.8 1.9  PHOS 12.1* 11.5* 8.7*  --   --   --   --    GFR: Estimated Creatinine Clearance: 5 mL/min (A) (by C-G formula based on SCr of 10.9 mg/dL (H)). Liver Function Tests: Recent Labs  Lab 10/26/21 2148 10/27/21 0233 10/28/21 0337 10/28/21 1125  AST 19 16  --  17  ALT 8 9  --  14  ALKPHOS 35* 34*  --  32*  BILITOT 0.9 0.9  --  0.9  PROT 5.8* 5.4*  --  5.3*  ALBUMIN 3.2* 2.7* 2.9* 2.8*   No results for input(s): LIPASE, AMYLASE in the last 168 hours. No results for input(s): AMMONIA in the last 168 hours. Coagulation Profile: Recent Labs  Lab  10/26/21 2148 10/28/21 1125  INR 1.2 1.1   Cardiac Enzymes: No results for input(s): CKTOTAL, CKMB, CKMBINDEX, TROPONINI in the last 168 hours. BNP (last 3 results) No results for input(s): PROBNP in the last 8760 hours. HbA1C: No results for input(s): HGBA1C in the last 72 hours.  CBG: Recent Labs  Lab 10/27/21 0212 10/27/21 1956  GLUCAP 94 103*  Lipid Profile: No results for input(s): CHOL, HDL, LDLCALC, TRIG, CHOLHDL, LDLDIRECT in the last 72 hours.  Thyroid Function Tests: No results for input(s): TSH, T4TOTAL, FREET4, T3FREE, THYROIDAB in the last 72 hours.  Anemia Panel: Recent Labs    10/31/21 0506 11/01/21 0845  RETICCTPCT 5.7* 5.1*   Sepsis Labs: Recent Labs  Lab 10/26/21 2148 10/27/21 0022  PROCALCITON  --  1.49  LATICACIDVEN 3.9* 1.9    Recent Results (from the past 240 hour(s))  Blood Culture (routine x 2)     Status: None   Collection Time: 10/26/21  9:48 PM   Specimen: BLOOD  Result Value Ref Range Status   Specimen Description BLOOD LEFT FOREARM  Final   Special Requests   Final    BOTTLES DRAWN AEROBIC AND ANAEROBIC Blood Culture adequate volume   Culture   Final    NO GROWTH 5 DAYS Performed at [facility], [address] [place], [place] [address]    Report Status 10/31/2021 FINAL  Final  Resp Panel by RT-PCR (Flu A&B, Covid) Nasopharyngeal Swab     Status: None   Collection Time: 10/26/21  9:56 PM   Specimen: Nasopharyngeal Swab; Nasopharyngeal(NP) swabs in vial transport medium  Result Value Ref Range Status   SARS Coronavirus 2 by RT PCR NEGATIVE NEGATIVE Final    Comment: (NOTE) SARS-CoV-2 target nucleic acids are NOT DETECTED.  The SARS-CoV-2 RNA is generally detectable in upper respiratory specimens during the acute phase of infection. The lowest concentration of SARS-CoV-2 viral copies this assay can detect is 138 copies/mL. A negative result does not preclude SARS-Cov-2 infection and should not be used as the  sole basis for treatment or other patient management decisions. A negative result may occur with  improper specimen collection/handling, submission of specimen other than nasopharyngeal swab, presence of viral mutation(s) within the areas targeted by this assay, and inadequate number of viral copies(<138 copies/mL). A negative result must be combined with clinical observations, patient history, and epidemiological information. The expected result is Negative.  Fact Sheet for Patients:  EntrepreneurPulse.com.au  Fact Sheet for Healthcare Providers:  IncredibleEmployment.be  This test is no t yet approved or cleared by the [place] FDA and  has been authorized for detection and/or diagnosis of SARS-CoV-2 by FDA under an Emergency Use Authorization (EUA). This EUA will remain  in effect (meaning this test can be used) for the duration of the COVID-19 declaration under Section 564(b)(1) of the Act, 21 U.S.C.section 360bbb-3(b)(1), unless the authorization is terminated  or revoked sooner.       Influenza A by PCR NEGATIVE NEGATIVE Final   Influenza B by PCR NEGATIVE NEGATIVE Final    Comment: (NOTE) The Xpert Xpress SARS-CoV-2/FLU/RSV plus assay is intended as an aid in the diagnosis of influenza from Nasopharyngeal swab specimens and should not be used as a sole basis for treatment. Nasal washings and aspirates are unacceptable for Xpert Xpress SARS-CoV-2/FLU/RSV testing.  Fact Sheet for Patients: EntrepreneurPulse.com.au  Fact Sheet for Healthcare Providers: IncredibleEmployment.be  This test is not yet approved or cleared by the [place] FDA and has been authorized for detection and/or diagnosis of SARS-CoV-2 by FDA under an Emergency Use Authorization (EUA). This EUA will remain in effect (meaning this test can be used) for the duration of the COVID-19 declaration under Section 564(b)(1) of the  Act, 21 U.S.C. section 360bbb-3(b)(1), unless the authorization is terminated or revoked.  Performed at [facility], [address] [place], [place] [address]  Blood Culture (routine x 2)     Status: Abnormal   Collection Time: 10/26/21  9:56 PM   Specimen: BLOOD  Result Value Ref Range Status   Specimen Description   Final    BLOOD RIGHT FOREARM Performed at [facility], [address] [place], [place] [address]    Special Requests   Final    BOTTLES DRAWN AEROBIC AND ANAEROBIC Blood Culture adequate volume Performed at [facility], [address] [place], [place] [address]    Culture  Setup Time   Final    GRAM POSITIVE COCCI Organism ID to follow ANAEROBIC BOTTLE ONLY CRITICAL RESULT CALLED TO, READ BACK BY AND VERIFIED WITH:    Culture (A)  Final    STAPHYLOCOCCUS EPIDERMIDIS THE SIGNIFICANCE OF ISOLATING THIS ORGANISM FROM A SINGLE SET OF BLOOD CULTURES WHEN MULTIPLE SETS ARE DRAWN IS UNCERTAIN. PLEASE NOTIFY THE MICROBIOLOGY DEPARTMENT WITHIN ONE WEEK IF SPECIATION AND SENSITIVITIES ARE REQUIRED. Performed at [facility], [place] [address], [place], [place] [address]    Report Status 10/29/2021 FINAL  Final  Blood Culture ID Panel (Reflexed)     Status: Abnormal   Collection Time: 10/26/21  9:56 PM  Result Value Ref Range Status   Enterococcus faecalis NOT DETECTED NOT DETECTED Final   Enterococcus Faecium NOT DETECTED NOT DETECTED Final   Listeria monocytogenes NOT DETECTED NOT DETECTED Final   Staphylococcus species DETECTED (A) NOT DETECTED Final    Comment: CRITICAL RESULT CALLED TO, READ BACK BY AND VERIFIED WITH: SUSAN WATSON 10/27/21 1554 MU    Staphylococcus aureus (BCID) NOT DETECTED NOT DETECTED Final   Staphylococcus epidermidis DETECTED (A) NOT DETECTED Final    Comment: CRITICAL RESULT CALLED TO, READ BACK BY AND VERIFIED WITH: SUSAN WATSON 10/27/21 1554 MU    Staphylococcus lugdunensis NOT DETECTED NOT DETECTED  Final   Streptococcus species NOT DETECTED NOT DETECTED Final   Streptococcus agalactiae NOT DETECTED NOT DETECTED Final   Streptococcus pneumoniae NOT DETECTED NOT DETECTED Final   Streptococcus pyogenes NOT DETECTED NOT DETECTED Final   A.calcoaceticus-baumannii NOT DETECTED NOT DETECTED Final   Bacteroides fragilis NOT DETECTED NOT DETECTED Final   Enterobacterales NOT DETECTED NOT DETECTED Final   Enterobacter cloacae complex NOT DETECTED NOT DETECTED Final   Escherichia coli NOT DETECTED NOT DETECTED Final   Klebsiella aerogenes NOT DETECTED NOT DETECTED Final   Klebsiella oxytoca NOT DETECTED NOT DETECTED Final   Klebsiella pneumoniae NOT DETECTED NOT DETECTED Final   Proteus species NOT DETECTED NOT DETECTED Final   Salmonella species NOT DETECTED NOT DETECTED Final   Serratia marcescens NOT DETECTED NOT DETECTED Final   Haemophilus influenzae NOT DETECTED NOT DETECTED Final   Neisseria meningitidis NOT DETECTED NOT DETECTED Final   Pseudomonas aeruginosa NOT DETECTED NOT DETECTED Final   Stenotrophomonas maltophilia NOT DETECTED NOT DETECTED Final   Candida albicans NOT DETECTED NOT DETECTED Final   Candida auris NOT DETECTED NOT DETECTED Final   Candida glabrata NOT DETECTED NOT DETECTED Final   Candida krusei NOT DETECTED NOT DETECTED Final   Candida parapsilosis NOT DETECTED NOT DETECTED Final   Candida tropicalis NOT DETECTED NOT DETECTED Final   Cryptococcus neoformans/gattii NOT DETECTED NOT DETECTED Final   Methicillin resistance mecA/C NOT DETECTED NOT DETECTED Final    Comment: Performed at [facility], [address] [place], [place] [address]  MRSA Next Gen by PCR, Nasal     Status: None   Collection Time: 10/27/21  2:37 AM  Specimen: Nasal Mucosa; Nasal Swab  Result Value Ref Range Status   MRSA by PCR Next Gen NOT DETECTED NOT DETECTED Final    Comment: (NOTE) The GeneXpert MRSA Assay (FDA approved for NASAL specimens only), is one component of a  comprehensive MRSA colonization surveillance program. It is not intended to diagnose MRSA infection nor to guide or monitor treatment for MRSA infections. Test performance is not FDA approved in patients less than 16 years old. Performed at [facility], [address], [place], [place] [address]       Radiology Studies: No results found.   Scheduled Meds:  sodium chloride   Intravenous Once   sodium chloride   Intravenous Once   amLODipine  10 mg Oral Daily   carvedilol  6.25 mg Oral BID WC   Chlorhexidine Gluconate Cloth  6 each Topical Q0600   [START ON 11/02/2021] Chlorhexidine Gluconate Cloth  6 each Topical Q0600   feeding supplement (NEPRO CARB STEADY)  237 mL Oral BID BM   folic acid  1 mg Oral Daily   heparin sodium (porcine)       losartan  100 mg Oral Daily   melatonin  5 mg Oral QHS   multivitamin  1 tablet Oral QHS   vitamin B-12  1,000 mcg Oral Daily   Continuous Infusions:  [START ON 11/02/2021]  ceFAZolin (ANCEF) IV     promethazine (PHENERGAN) injection (IM or IVPB)       LOS: 6 days     Enzo Bi, MD [facility] If 7PM-7AM, please contact night-coverage 11/01/2021, 4:07 PM

## 2021-11-01 NOTE — Progress Notes (Signed)
Patient returned from HD.

## 2021-11-01 NOTE — Progress Notes (Signed)
[facility]  PROGRESS NOTE   Subjective:   Seen and examined on hemodialysis treatment. Tolerating treatment well.     HEMODIALYSIS FLOWSHEET:  Blood Flow Rate (mL/min): 300 mL/min Arterial Pressure (mmHg): -170 mmHg Venous Pressure (mmHg): 80 mmHg Transmembrane Pressure (mmHg): 60 mmHg Ultrafiltration Rate (mL/min): 330 mL/min Dialysate Flow Rate (mL/min): 500 ml/min Conductivity: Machine : 14.2 Conductivity: Machine : 14.2 Dialysis Fluid Bolus: Normal Saline Bolus Amount (mL): 250 mL   Objective:  Vital signs in last 24 hours:  Temp:  [97.9 F (36.6 C)-98.9 F (37.2 C)] 98.2 F (36.8 C) (11/14 1515) Pulse Rate:  [69-84] 84 (11/14 1515) Resp:  [12-24] 16 (11/14 1515) BP: (122-174)/(77-104) 124/77 (11/14 1515) SpO2:  [92 %-100 %] 92 % (11/14 1515) Weight:  [53.3 kg-53.7 kg] 53.3 kg (11/14 1315)  Weight change:  Filed Weights   10/31/21 0420 11/01/21 0930 11/01/21 1315  Weight: 53.3 kg 53.7 kg 53.3 kg    Intake/Output: No intake/output data recorded.   Intake/Output this shift:  Total I/O In: 120 [P.O.:120] Out: 500 [Other:500]  Physical Exam: General:  No acute distress  Head:  Normocephalic, atraumatic. Moist oral mucosal membranes  Eyes:  Anicteric  Neck:  Supple  Lungs:   Clear to auscultation, normal effort  Heart:  regular  Abdomen:   Soft, nontender, bowel sounds present  Extremities:  peripheral edema.  Neurologic:  Awake, alert, following commands  Skin:  No lesions  Access: Left IJ temp HD catheter    Basic Metabolic Panel: Recent Labs  Lab 10/27/21 0616 10/27/21 1253 10/28/21 0337 10/28/21 1125 10/30/21 0708 10/31/21 0506 11/01/21 0711  NA 135  --  133* 134* 138 135 140  K 4.5  --  3.6 3.7 3.3* 3.0* 4.8  CL 96*  --  96* 96* 100 102 105  CO2 13*  --  18* 18* 27 25 18*  GLUCOSE 92  --  104* 107* 106* 116* 97  BUN 166*  --  111* 99* 29* 40* 51*  CREATININE 22.11*  --  16.05* 16.56* 7.02* 8.64* 10.90*  CALCIUM 7.7*  --   7.4* 7.6* 7.7* 7.4* 8.6*  MG 1.8  --  1.9  --  1.8 1.8 1.9  PHOS 12.1* 11.5* 8.7*  --   --   --   --      CBC: Recent Labs  Lab 10/26/21 2148 10/27/21 0233 10/27/21 0616 10/28/21 1125 10/29/21 0019 10/29/21 0616 10/29/21 1331 10/30/21 0708 10/31/21 0506 11/01/21 0711  WBC 11.6* 9.6   < > 12.6*  --   --  9.0 10.3 9.1 7.6  NEUTROABS 10.7* 8.9*  --  11.6*  --   --  7.6 8.7*  --   --   HGB 6.0* 5.5*   < > 8.0*   < > 7.9* 8.4* 8.8* 8.4* 8.6*  HCT 18.1* 15.9*   < > 23.2*   < > 24.1* 25.4* 26.8* 26.8* 26.2*  MCV 104.0* 103.9*   < > 90.6  --   --  95.8 98.2 97.5 96.3  PLT 123* 89*   < > 114*  --   --  157 180 239 248   < > = values in this interval not displayed.      Urinalysis: No results for input(s): COLORURINE, LABSPEC, PHURINE, GLUCOSEU, HGBUR, BILIRUBINUR, KETONESUR, PROTEINUR, UROBILINOGEN, NITRITE, LEUKOCYTESUR in the last 72 hours.  Invalid input(s): APPERANCEUR    Imaging: No results found.   Medications:    [START ON 11/02/2021]  ceFAZolin (  ANCEF) IV     promethazine (PHENERGAN) injection (IM or IVPB)      sodium chloride   Intravenous Once   sodium chloride   Intravenous Once   amLODipine  10 mg Oral Daily   carvedilol  6.25 mg Oral BID WC   Chlorhexidine Gluconate Cloth  6 each Topical Q0600   [START ON 11/02/2021] Chlorhexidine Gluconate Cloth  6 each Topical Q0600   feeding supplement  1 Container Oral TID BM   folic acid  1 mg Oral Daily   heparin sodium (porcine)       losartan  100 mg Oral Daily   melatonin  5 mg Oral QHS   multivitamin  1 tablet Oral QHS   vitamin B-12  1,000 mcg Oral Daily    Assessment/ Plan:     Active Problems:   Acute respiratory failure with hypoxia (HCC)   Acute kidney injury ([place])   Anemia due to folic acid deficiency   B12 deficiency   Thrombocytopenia (HCC)   Anemia in chronic kidney disease, on chronic dialysis [facility])  Joy Ray is a 55 y.o. black female with hypertension admitted with  hypertensive urgency.   End stage renal disease: seen and examined on fourth hemodialysis treatment. Outpatient planning for peritoneal dialysis. PD catheter to be placed tomorrow.   Hypertension: 124/77 on hemodialysis treatment. Malignant hypertension on admission. Now placed on losartan, carvedilol and amlodipine.   Anemia with acute kidney disease: hemoglobin 8.6. appreciate hematology input. Schistocytes on smear most likely secondary to malignant hypertension. Status post 1 unit PRBC transfusion.   Secondary Hyperparathyroidism: PTH 348, phosphorus elevated at 8.7 on 11/10. Not currently on a binder.    LOS: [place], [facility] 11/14/20224:23 PM

## 2021-11-01 NOTE — Progress Notes (Signed)
Initial Nutrition Assessment  DOCUMENTATION CODES:   Severe malnutrition in context of chronic illness  INTERVENTION:   Boost Breeze po TID, each supplement provides 250 kcal and 9 grams of protein  Rena-vit po daily   Recommend liberal diet   Pt at high refeed risk; recommend monitor potassium, magnesium and phosphorus labs daily until stable  NUTRITION DIAGNOSIS:   Severe Malnutrition related to chronic illness (ESRD) as evidenced by moderate fat depletion, severe fat depletion, moderate muscle depletion, severe muscle depletion.  GOAL:   Patient will meet greater than or equal to 90% of their needs  MONITOR:   PO intake, Supplement acceptance, Labs, Weight trends, Skin, I & O's  REASON FOR ASSESSMENT:   Other (Comment) (Low BMI)    ASSESSMENT:   55 year old female with history of hypertension and incisional hernia who is admitted with hypertensive urgency, AKI on ESRD, NSTEMI, anemia and flash pulmonary edema  Pt s/p new HD 11/10  Met with pt in room today. Pt reports fair appetite and oral intake at baseline but reports that her oral intake is decreased in the hospital as she does not like the food. Pt eating 50-75% of meals in hospital. Pt reports that she can not drink Nepro or Ensure as they give her diarrhea. RD discussed with pt the importance of adequate nutrition needed to preserve lean muscle and to prevent losses from HD. Pt is willing to try Boost Breeze. RD will add supplements and vitamins to help pt meet her estimated needs. Per chart, pt is down 9lbs(7%) over the past 6 months; this is not significant. Plan is for PD catheter placement and incisional hernia repair tomorrow.   Medications reviewed and include: folic acid, heparin, melatonin, rena-vit, B12, cefazolin  Labs reviewed: K 4.8 wnl, BUN 51(H), creat 10.90(H), Mg 1.9 wnl Folate 4.9(L), B12 215- 11/10 Hgb 8.6(L), Hct 26.2(L)  NUTRITION - FOCUSED PHYSICAL EXAM:  Flowsheet Row Most Recent Value   Orbital Region Mild depletion  Upper Arm Region Severe depletion  Thoracic and Lumbar Region Severe depletion  Buccal Region Mild depletion  Temple Region Mild depletion  Clavicle Bone Region Moderate depletion  Clavicle and Acromion Bone Region Moderate depletion  Scapular Bone Region Moderate depletion  Dorsal Hand Moderate depletion  Patellar Region Severe depletion  Anterior Thigh Region Severe depletion  Posterior Calf Region Severe depletion  Edema (RD Assessment) Moderate  Hair Reviewed  Eyes Reviewed  Mouth Reviewed  Skin Reviewed  Nails Reviewed   Diet Order:   Diet Order             Diet NPO time specified  Diet effective midnight           Diet renal with fluid restriction Fluid restriction: 1200 mL Fluid; Room service appropriate? Yes; Fluid consistency: Thin  Diet effective now                  EDUCATION NEEDS:   Education needs have been addressed  Skin:  Skin Assessment: Reviewed RN Assessment  Last BM:  11/14  Height:   Ht Readings from Last 1 Encounters:  10/26/21 5' 8" (1.727 m)    Weight:   Wt Readings from Last 1 Encounters:  11/01/21 53.3 kg    Ideal Body Weight:  63.6 kg  BMI:  Body mass index is 17.87 kg/m.  Estimated Nutritional Needs:   Kcal:  1700-2000kcal/day  Protein:  85-100g/day  Fluid:  UOP +1L  Koleen Distance MS, RD, LDN Please refer to [facility] for  RD and/or RD on-call/weekend/after hours pager  

## 2021-11-01 NOTE — Progress Notes (Signed)
Patient transported to HD 

## 2021-11-01 NOTE — Progress Notes (Signed)
Hemodialysis:  Tolerated 3hours of dialysis with 549ml net removal via Left IJ triple lumen temporary catheter. Patient tolerated well, no adverse effects noted.CVC dressing changed,lumens wrapped with gauze/taped.

## 2021-11-01 NOTE — Progress Notes (Signed)
Order received from Dr Billie Ruddy to remove stitch from patients left wrist. Stitch removed without complication

## 2021-11-02 ENCOUNTER — Encounter
Admission: EM | Disposition: A | Discharge status: Home / Self Care | Payer: Self-pay | Source: Home / Self Care | Attending: Hospitalist

## 2021-11-02 ENCOUNTER — Ambulatory Visit: Admission: RE | Admit: 2021-11-02 | Payer: 59 | Source: Home / Self Care | Admitting: Surgery

## 2021-11-02 ENCOUNTER — Inpatient Hospital Stay: Payer: 59 | Admitting: Anesthesiology

## 2021-11-02 ENCOUNTER — Other Ambulatory Visit: Payer: Self-pay

## 2021-11-02 DIAGNOSIS — I120 Hypertensive chronic kidney disease with stage 5 chronic kidney disease or end stage renal disease: Secondary | ICD-10-CM

## 2021-11-02 DIAGNOSIS — Z4902 Encounter for fitting and adjustment of peritoneal dialysis catheter: Secondary | ICD-10-CM

## 2021-11-02 HISTORY — PX: CAPD INSERTION: SHX5233

## 2021-11-02 HISTORY — PX: INSERTION OF MESH: SHX5868

## 2021-11-02 LAB — BASIC METABOLIC PANEL
Anion gap: 7 (ref 5–15)
BUN: 26 mg/dL — ABNORMAL HIGH (ref 6–20)
CO2: 28 mmol/L (ref 22–32)
Calcium: 7.8 mg/dL — ABNORMAL LOW (ref 8.9–10.3)
Chloride: 103 mmol/L (ref 98–111)
Creatinine, Ser: 6.53 mg/dL — ABNORMAL HIGH (ref 0.44–1.00)
GFR, Estimated: 7 mL/min — ABNORMAL LOW (ref >60)
Glucose, Bld: 98 mg/dL (ref 70–99)
Potassium: 3.1 mmol/L — ABNORMAL LOW (ref 3.5–5.1)
Sodium: 138 mmol/L (ref 135–145)

## 2021-11-02 LAB — CBC
HCT: 26.3 % — ABNORMAL LOW (ref 36.0–46.0)
Hemoglobin: 8.4 g/dL — ABNORMAL LOW (ref 12.0–15.0)
MCH: 31.5 pg (ref 26.0–34.0)
MCHC: 31.9 g/dL (ref 30.0–36.0)
MCV: 98.5 fL (ref 80.0–100.0)
Platelets: 264 10*3/uL (ref 150–400)
RBC: 2.67 MIL/uL — ABNORMAL LOW (ref 3.87–5.11)
RDW: 19.2 % — ABNORMAL HIGH (ref 11.5–15.5)
WBC: 9.6 10*3/uL (ref 4.0–10.5)
nRBC: 0 % (ref 0.0–0.2)

## 2021-11-02 LAB — RETIC PANEL
Immature Retic Fract: 5.6 % (ref 2.3–15.9)
RBC.: 2.68 MIL/uL — ABNORMAL LOW (ref 3.87–5.11)
Retic Count, Absolute: 112.6 10*3/uL (ref 19.0–186.0)
Retic Ct Pct: 4.2 % — ABNORMAL HIGH (ref 0.4–3.1)
Reticulocyte Hemoglobin: 29.6 pg (ref >27.9)

## 2021-11-02 LAB — MAGNESIUM: Magnesium: 1.8 mg/dL (ref 1.7–2.4)

## 2021-11-02 LAB — LACTATE DEHYDROGENASE: LDH: 244 U/L — ABNORMAL HIGH (ref 98–192)

## 2021-11-02 SURGERY — LAPAROSCOPIC INSERTION CONTINUOUS AMBULATORY PERITONEAL DIALYSIS  (CAPD) CATHETER
Anesthesia: General | Site: Abdomen

## 2021-11-02 MED ORDER — ONDANSETRON HCL 4 MG/2ML IJ SOLN: 4.0000 mg | Freq: Once | INTRAMUSCULAR | Status: DC | PRN

## 2021-11-02 MED ORDER — RENA-VITE PO TABS: 1.0000 | ORAL_TABLET | Freq: Every day | ORAL | 0 refills | Status: DC

## 2021-11-02 MED ORDER — NEOSTIGMINE METHYLSULFATE 10 MG/10ML IV SOLN
INTRAVENOUS | Status: DC | PRN
  Administered 2021-11-02: 2 mg via INTRAVENOUS

## 2021-11-02 MED ORDER — LIDOCAINE HCL (PF) 2 % IJ SOLN
INTRAMUSCULAR | Status: AC
  Filled 2021-11-02: qty 5

## 2021-11-02 MED ORDER — ONDANSETRON HCL 4 MG/2ML IJ SOLN
INTRAMUSCULAR | Status: AC
  Filled 2021-11-02: qty 2

## 2021-11-02 MED ORDER — ESMOLOL HCL 100 MG/10ML IV SOLN
INTRAVENOUS | Status: DC | PRN
  Administered 2021-11-02 (×2): 20 mg via INTRAVENOUS

## 2021-11-02 MED ORDER — SODIUM CHLORIDE 0.9 % IV SOLN: INTRAVENOUS | Status: DC | PRN

## 2021-11-02 MED ORDER — CEFAZOLIN SODIUM-DEXTROSE 1-4 GM/50ML-% IV SOLN
INTRAVENOUS | Status: AC
  Filled 2021-11-02: qty 50

## 2021-11-02 MED ORDER — TRAMADOL HCL 50 MG PO TABS: 100.0000 mg | ORAL_TABLET | Freq: Four times a day (QID) | ORAL | 0 refills | Status: DC | PRN

## 2021-11-02 MED ORDER — ONDANSETRON HCL 4 MG/2ML IJ SOLN
INTRAMUSCULAR | Status: DC | PRN
  Administered 2021-11-02: 4 mg via INTRAVENOUS

## 2021-11-02 MED ORDER — PROPOFOL 10 MG/ML IV BOLUS
INTRAVENOUS | Status: DC | PRN
  Administered 2021-11-02: 50 mg via INTRAVENOUS
  Administered 2021-11-02: 100 mg via INTRAVENOUS
  Administered 2021-11-02: 50 mg via INTRAVENOUS

## 2021-11-02 MED ORDER — 0.9 % SODIUM CHLORIDE (POUR BTL) OPTIME
TOPICAL | Status: DC | PRN
  Administered 2021-11-02: 1000 mL

## 2021-11-02 MED ORDER — ROCURONIUM BROMIDE 10 MG/ML (PF) SYRINGE
PREFILLED_SYRINGE | INTRAVENOUS | Status: AC
  Filled 2021-11-02: qty 10

## 2021-11-02 MED ORDER — OXYCODONE HCL 5 MG PO TABS: 5.0000 mg | ORAL_TABLET | Freq: Four times a day (QID) | ORAL | 0 refills | Status: AC | PRN

## 2021-11-02 MED ORDER — LOSARTAN POTASSIUM 100 MG PO TABS: 100.0000 mg | ORAL_TABLET | Freq: Every day | ORAL | 2 refills | Status: DC

## 2021-11-02 MED ORDER — FENTANYL CITRATE (PF) 100 MCG/2ML IJ SOLN
INTRAMUSCULAR | Status: AC
  Filled 2021-11-02: qty 2

## 2021-11-02 MED ORDER — MIDAZOLAM HCL 2 MG/2ML IJ SOLN
INTRAMUSCULAR | Status: DC | PRN
  Administered 2021-11-02: 2 mg via INTRAVENOUS

## 2021-11-02 MED ORDER — HEPARIN SODIUM (PORCINE) 10000 UNIT/ML IJ SOLN
INTRAMUSCULAR | Status: DC | PRN
  Administered 2021-11-02: 10000 [IU]

## 2021-11-02 MED ORDER — HEPARIN SODIUM (PORCINE) 10000 UNIT/ML IJ SOLN
INTRAMUSCULAR | Status: AC
  Filled 2021-11-02: qty 1

## 2021-11-02 MED ORDER — FENTANYL CITRATE (PF) 100 MCG/2ML IJ SOLN
25.0000 ug | INTRAMUSCULAR | Status: DC | PRN
  Administered 2021-11-02 (×3): 25 ug via INTRAVENOUS

## 2021-11-02 MED ORDER — DEXMEDETOMIDINE (PRECEDEX) IN NS 20 MCG/5ML (4 MCG/ML) IV SYRINGE
PREFILLED_SYRINGE | INTRAVENOUS | Status: DC | PRN
  Administered 2021-11-02 (×2): 4 ug via INTRAVENOUS
  Administered 2021-11-02: 8 ug via INTRAVENOUS
  Administered 2021-11-02: 4 ug via INTRAVENOUS

## 2021-11-02 MED ORDER — BUPIVACAINE-EPINEPHRINE (PF) 0.25% -1:200000 IJ SOLN
INTRAMUSCULAR | Status: AC
  Filled 2021-11-02: qty 30

## 2021-11-02 MED ORDER — MORPHINE SULFATE (PF) 2 MG/ML IV SOLN: 2.0000 mg | INTRAVENOUS | Status: DC | PRN

## 2021-11-02 MED ORDER — DEXAMETHASONE SODIUM PHOSPHATE 10 MG/ML IJ SOLN
INTRAMUSCULAR | Status: DC | PRN
  Administered 2021-11-02: 5 mg via INTRAVENOUS

## 2021-11-02 MED ORDER — FOLIC ACID 1 MG PO TABS: 1.0000 mg | ORAL_TABLET | Freq: Every day | ORAL | Status: DC

## 2021-11-02 MED ORDER — DEXAMETHASONE SODIUM PHOSPHATE 10 MG/ML IJ SOLN
INTRAMUSCULAR | Status: AC
  Filled 2021-11-02: qty 1

## 2021-11-02 MED ORDER — FENTANYL CITRATE (PF) 100 MCG/2ML IJ SOLN
INTRAMUSCULAR | Status: AC
  Administered 2021-11-02: 25 ug via INTRAVENOUS
  Filled 2021-11-02: qty 2

## 2021-11-02 MED ORDER — GLYCOPYRROLATE 0.2 MG/ML IJ SOLN
INTRAMUSCULAR | Status: DC | PRN
  Administered 2021-11-02 (×2): .2 mg via INTRAVENOUS

## 2021-11-02 MED ORDER — NEOSTIGMINE METHYLSULFATE 10 MG/10ML IV SOLN
INTRAVENOUS | Status: AC
  Filled 2021-11-02: qty 1

## 2021-11-02 MED ORDER — CHLORHEXIDINE GLUCONATE 0.12 % MT SOLN
15.0000 mL | Freq: Once | OROMUCOSAL | Status: AC
Start: 2021-11-02 — End: 2021-11-02

## 2021-11-02 MED ORDER — CARVEDILOL 6.25 MG PO TABS: 6.2500 mg | ORAL_TABLET | Freq: Two times a day (BID) | ORAL | 2 refills | Status: DC

## 2021-11-02 MED ORDER — FENTANYL CITRATE (PF) 100 MCG/2ML IJ SOLN
INTRAMUSCULAR | Status: DC | PRN
  Administered 2021-11-02 (×4): 25 ug via INTRAVENOUS

## 2021-11-02 MED ORDER — ACETAMINOPHEN 500 MG PO TABS: 1000.0000 mg | ORAL_TABLET | Freq: Four times a day (QID) | ORAL | Status: DC

## 2021-11-02 MED ORDER — BUPIVACAINE LIPOSOME 1.3 % IJ SUSP
INTRAMUSCULAR | Status: AC
  Filled 2021-11-02: qty 20

## 2021-11-02 MED ORDER — PHENYLEPHRINE HCL-NACL 20-0.9 MG/250ML-% IV SOLN
INTRAVENOUS | Status: AC
  Filled 2021-11-02: qty 250

## 2021-11-02 MED ORDER — GLYCOPYRROLATE 0.2 MG/ML IJ SOLN
INTRAMUSCULAR | Status: AC
  Filled 2021-11-02: qty 2

## 2021-11-02 MED ORDER — MIDAZOLAM HCL 2 MG/2ML IJ SOLN
INTRAMUSCULAR | Status: AC
  Filled 2021-11-02: qty 2

## 2021-11-02 MED ORDER — SODIUM CHLORIDE 0.9 % IR SOLN
Status: DC | PRN
  Administered 2021-11-02: 1000 mL

## 2021-11-02 MED ORDER — ROCURONIUM BROMIDE 100 MG/10ML IV SOLN
INTRAVENOUS | Status: DC | PRN
  Administered 2021-11-02: 50 mg via INTRAVENOUS

## 2021-11-02 MED ORDER — OXYCODONE HCL 5 MG PO TABS: 5.0000 mg | ORAL_TABLET | ORAL | Status: DC | PRN

## 2021-11-02 MED ORDER — ACETAMINOPHEN 10 MG/ML IV SOLN
INTRAVENOUS | Status: DC | PRN
  Administered 2021-11-02: 1000 mg via INTRAVENOUS

## 2021-11-02 MED ORDER — CHLORHEXIDINE GLUCONATE 0.12 % MT SOLN
OROMUCOSAL | Status: AC
  Administered 2021-11-02: 15 mL via OROMUCOSAL
  Filled 2021-11-02: qty 15

## 2021-11-02 MED ORDER — CYANOCOBALAMIN 1000 MCG PO TABS: 1000.0000 ug | ORAL_TABLET | Freq: Every day | ORAL | Status: DC

## 2021-11-02 MED ORDER — AMLODIPINE BESYLATE 10 MG PO TABS: 10.0000 mg | ORAL_TABLET | Freq: Every day | ORAL | 2 refills | Status: DC

## 2021-11-02 MED ORDER — LIDOCAINE HCL (CARDIAC) PF 100 MG/5ML IV SOSY
PREFILLED_SYRINGE | INTRAVENOUS | Status: DC | PRN
  Administered 2021-11-02: 60 mg via INTRAVENOUS

## 2021-11-02 MED ORDER — BUPIVACAINE-EPINEPHRINE 0.25% -1:200000 IJ SOLN
INTRAMUSCULAR | Status: DC | PRN
  Administered 2021-11-02: 50 mL via INTRAMUSCULAR

## 2021-11-02 SURGICAL SUPPLY — 54 items
ADAPTER CATH DIALYSIS 4X8 IT L (MISCELLANEOUS) ×3 IMPLANT
BIOPATCH WHT 1IN DISK W/4.0 H (GAUZE/BANDAGES/DRESSINGS) ×3 IMPLANT
BLADE CLIPPER SURG (BLADE) IMPLANT
CATH EXTENDED DIALYSIS (CATHETERS) ×3 IMPLANT
CHLORAPREP W/TINT 26 (MISCELLANEOUS) ×3 IMPLANT
DEFOGGER SCOPE WARMER CLEARIFY (MISCELLANEOUS) ×3 IMPLANT
DERMABOND ADVANCED (GAUZE/BANDAGES/DRESSINGS) ×1
DERMABOND ADVANCED .7 DNX12 (GAUZE/BANDAGES/DRESSINGS) ×2 IMPLANT
ELECT CAUTERY BLADE 6.4 (BLADE) ×3 IMPLANT
ELECT REM PT RETURN 9FT ADLT (ELECTROSURGICAL) ×3
ELECTRODE REM PT RTRN 9FT ADLT (ELECTROSURGICAL) ×2 IMPLANT
GAUZE 4X4 16PLY ~~LOC~~+RFID DBL (SPONGE) ×3 IMPLANT
GLOVE SURG ENC MOIS LTX SZ7 (GLOVE) ×9 IMPLANT
GOWN STRL REUS W/ TWL LRG LVL3 (GOWN DISPOSABLE) ×4 IMPLANT
GOWN STRL REUS W/TWL LRG LVL3 (GOWN DISPOSABLE) ×2
GRASPER SUT TROCAR 14GX15 (MISCELLANEOUS) ×3 IMPLANT
IV NS 1000ML (IV SOLUTION) ×1
IV NS 1000ML BAXH (IV SOLUTION) ×2 IMPLANT
KIT TURNOVER KIT A (KITS) ×3 IMPLANT
MANIFOLD NEPTUNE II (INSTRUMENTS) ×3 IMPLANT
MESH VENTRALEX ST 1-7/10 CRC S (Mesh General) ×3 IMPLANT
MINICAP W/POVIDONE IODINE SOL (MISCELLANEOUS) ×3 IMPLANT
NDL SAFETY ECLIPSE 18X1.5 (NEEDLE) ×2 IMPLANT
NEEDLE HYPO 18GX1.5 SHARP (NEEDLE) ×1
NEEDLE HYPO 22GX1.5 SAFETY (NEEDLE) ×3 IMPLANT
NEEDLE INSUFFLATION 14GA 120MM (NEEDLE) ×3 IMPLANT
NS IRRIG 500ML POUR BTL (IV SOLUTION) ×3 IMPLANT
PACK LAP CHOLECYSTECTOMY (MISCELLANEOUS) ×3 IMPLANT
PENCIL ELECTRO HAND CTR (MISCELLANEOUS) ×3 IMPLANT
SET CYSTO W/LG BORE CLAMP LF (SET/KITS/TRAYS/PACK) ×3 IMPLANT
SET TRANSFER 6 W/TWIST CLAMP 5 (SET/KITS/TRAYS/PACK) ×3 IMPLANT
SET TUBE SMOKE EVAC HIGH FLOW (TUBING) ×3 IMPLANT
SLEEVE ADV FIXATION 5X100MM (TROCAR) ×3 IMPLANT
SPONGE DRAIN TRACH 4X4 STRL 2S (GAUZE/BANDAGES/DRESSINGS) ×3 IMPLANT
SPONGE T-LAP 18X18 ~~LOC~~+RFID (SPONGE) ×3 IMPLANT
STYLET FALLER (MISCELLANEOUS) IMPLANT
STYLET FALLER MEDIONICS (MISCELLANEOUS) IMPLANT
SUT ETHIBOND 0 MO6 C/R (SUTURE) ×3 IMPLANT
SUT ETHILON 3-0 FS-10 30 BLK (SUTURE) ×3
SUT MNCRL 4-0 (SUTURE) ×1
SUT MNCRL 4-0 27XMFL (SUTURE) ×2
SUT VIC AB 3-0 SH 27 (SUTURE) ×2
SUT VIC AB 3-0 SH 27X BRD (SUTURE) ×4 IMPLANT
SUT VICRYL 0 AB UR-6 (SUTURE) ×6 IMPLANT
SUTURE EHLN 3-0 FS-10 30 BLK (SUTURE) ×2 IMPLANT
SUTURE MNCRL 4-0 27XMF (SUTURE) ×2 IMPLANT
SYR 20ML LL LF (SYRINGE) ×3 IMPLANT
SYR 3ML LL SCALE MARK (SYRINGE) ×3 IMPLANT
SYR TOOMEY IRRIG 70ML (MISCELLANEOUS) ×3
SYRINGE TOOMEY IRRIG 70ML (MISCELLANEOUS) ×2 IMPLANT
SYS KII FIOS ACCESS ABD 5X100 (TROCAR) ×3
SYSTEM KII FIOS ACES ABD 5X100 (TROCAR) ×2 IMPLANT
TROCAR XCEL NON-BLD 5MMX100MML (ENDOMECHANICALS) ×3 IMPLANT
WATER STERILE IRR 500ML POUR (IV SOLUTION) IMPLANT

## 2021-11-02 NOTE — Anesthesia Procedure Notes (Cosign Needed Addendum)
Procedure Name: Intubation Date/Time: 11/02/2021 10:32 AM Performed by: Justin Mend, RN Pre-anesthesia Checklist: Patient identified, Emergency Drugs available, Suction available and Patient being monitored Patient Re-evaluated:Patient Re-evaluated prior to induction Oxygen Delivery Method: Circle system utilized Preoxygenation: Pre-oxygenation with 100% oxygen Induction Type: IV induction Ventilation: Mask ventilation without difficulty Laryngoscope Size: McGraph and 3 Grade View: Grade II Tube type: Oral Tube size: 7.0 mm Number of attempts: 1 Airway Equipment and Method: Stylet and Oral airway Placement Confirmation: ETT inserted through vocal cords under direct vision, positive ETCO2 and breath sounds checked- equal and bilateral Tube secured with: Tape Dental Injury: Teeth and Oropharynx as per pre-operative assessment

## 2021-11-02 NOTE — Transfer of Care (Signed)
Immediate Anesthesia Transfer of Care Note  Patient: Joy Ray  Procedure(s) Performed: LAPAROSCOPIC INSERTION CONTINUOUS AMBULATORY PERITONEAL DIALYSIS  (CAPD) CATHETER (Abdomen) INSERTION OF MESH (Abdomen)  Patient Location: PACU  Anesthesia Type:General  Level of Consciousness: alert   Airway & Oxygen Therapy: Patient connected to face mask oxygen  Post-op Assessment: Report given to RN  Post vital signs: stable  Last Vitals:  Vitals Value Taken Time  BP    Temp    Pulse    Resp    SpO2      Last Pain:  Vitals:   11/02/21 0939  TempSrc: Temporal  PainSc: 0-No pain      Patients Stated Pain Goal: 0 (79/15/04 1364)  Complications: No notable events documented.

## 2021-11-02 NOTE — Anesthesia Preprocedure Evaluation (Signed)
Anesthesia Evaluation  Patient identified by MRN, date of birth, ID band Patient awake    Reviewed: Allergy & Precautions, H&P , NPO status , Patient's Chart, lab work & pertinent test results, reviewed documented beta blocker date and time   Airway Mallampati: II  TM Distance: >3 FB Neck ROM: full    Dental  (+) Teeth Intact   Pulmonary neg pulmonary ROS, Current Smoker,    Pulmonary exam normal        Cardiovascular Exercise Tolerance: Good hypertension, On Medications negative cardio ROS Normal cardiovascular exam Rhythm:regular Rate:Normal     Neuro/Psych negative neurological ROS  negative psych ROS   GI/Hepatic negative GI ROS, Neg liver ROS,   Endo/Other  negative endocrine ROS  Renal/GU ESRF and DialysisRenal disease  negative genitourinary   Musculoskeletal   Abdominal   Peds  Hematology  (+) Blood dyscrasia, anemia ,   Anesthesia Other Findings Past Medical History: No date: Allergy No date: Cigarette smoker No date: HTN, goal below 140/90 No date: Second degree hemorrhoids Past Surgical History: 04/21/2016: COLONOSCOPY WITH PROPOFOL; N/A     Comment:  Procedure: COLONOSCOPY WITH PROPOFOL;  Surgeon: Lucilla Lame, MD;  Location: [facility];  Service:               Endoscopy;  Laterality: N/A; 1994: TUBAL LIGATION     Comment:  After G1P1001 BMI    Body Mass Index: 18.87 kg/m     Reproductive/Obstetrics negative OB ROS                             Anesthesia Physical Anesthesia Plan  ASA: 2  Anesthesia Plan: General ETT   Post-op Pain Management:    Induction:   PONV Risk Score and Plan:   Airway Management Planned:   Additional Equipment:   Intra-op Plan:   Post-operative Plan:   Informed Consent: I have reviewed the patients History and Physical, chart, labs and discussed the procedure including the risks, benefits and alternatives  for the proposed anesthesia with the patient or authorized representative who has indicated his/her understanding and acceptance.     Dental Advisory Given  Plan Discussed with: CRNA  Anesthesia Plan Comments:         Anesthesia Quick Evaluation

## 2021-11-02 NOTE — Progress Notes (Signed)
Preoperative Review   Patient is met in the preoperative holding area. The history is reviewed in the chart and with the patient. I personally reviewed the options and rationale as well as the risks of this procedure that have been previously discussed with the patient. Especifically we discussed the finding of a small incisional hernia from a tubal ligation, I discussed the rationale to fix that while we wear in the operating room given that she will use a peritoneal dialysis catheter that will increase her intra-abdominal pressure and will likely enlarge her defect make him more prone to potential complications.  She is in agreement to proceed w PD cath placement and hernia repair. All questions asked by the patient were answered to their satisfaction. Risks, benefits and possible complications d/w the pt in detail  Patient agrees to proceed with this procedure at this time.  Caroleen Hamman M.D. FACS

## 2021-11-02 NOTE — Progress Notes (Signed)
Patient discharged to home via wheelchair.  No acute distress noted. Patient discharged with all pertinent information, prescriptions, and personal belongings.  Patient able to teach back instructions, all questions answered. IV site and IJ d/ced per MD order.  Care relinquished.

## 2021-11-02 NOTE — Discharge Summary (Signed)
Physician Discharge Summary   Joy Ray  female DOB: 12-Dec-1966  YTK:[id number]  PCP: Delsa Grana, PA-C  Admit date: 10/26/2021 Discharge date: 11/02/2021  Admitted From: home Disposition:  home CODE STATUS: Full code  Discharge Instructions     Discharge instructions   Complete by: As directed    You have been started on 3 blood pressure medications, amlodipine, coreg and losartan.  Please take them as directed.  You will need to go to dialysis training session tomorrow 11/03/21.   Dr. Enzo Bi [facility] Course:  For full details, please see H&P, progress notes, consult notes and ancillary notes.  Briefly,  Joy Ray is a 55 y.o female with significant PMH of/as below who presented to the ED with chief complaints of progressive shortness of breath x2 days.   She checked her BP at home and noted it to be extremely elevated in the upper 751Z systolic with associated mild to moderate headache.  Patient was advised by her PCP to go to the ED for further evaluation.  Patient called EMS due to worsening symptoms associated with hypoxia and elevated blood pressure of 242/154 mm Hg. She received 10 mg of labetalol on route to the ED and was placed on NRB.   On arrival to the ED, she was hypothermic 94.2 (34.6) with blood pressure 214/125 mm Hg and pulse rate 52 beats/min, RR 28-32 breaths per minute., Sats 56% on NRB.  Stat chest x-ray was obtained and showed diffuse pulmonary edema concerning for flash pulmonary edema with her accelerated hypertension.  She was placed on BiPAP, started on nitro drip follow up by dose of IV Lasix, and nicardipine gtt, and admitted by [facility].   Acute Hypoxic Respiratory Failure secondary to  Flash pulmonary edema  --BNP markedly elevated >45000pg/mL --pt received ceftriaxone and azithromycin x1 dose on presentation for presumed CAP, however, no strong evidence of PNA, so abx not continued. --already weaned down to  RA next morning, after dialysis and BP control --iHD for volume removal   Hypertensive Emergency with --Evidence of End Organ Dysfunction (Pulmonary Edema, AKI, Elevated troponin, headache) --off nitro gtt and nicardipine gtt next morning.  started on amlodipine, coreg and losartan, per nephrology orders --cont coreg, amlodipine and losartan after discharge, with outpatient followup for further adjustment.   Current smoker   Elevated Troponin 2/2 demand ischemia --trop 180's flat.  Demand ischemia in the setting of severe HTN and pulm edema.   Severe Acute Kidney Injury or undiagnosed ESRD --renal US showed Echogenic kidneys compatible with medical renal disease. --started on dialysis --PD cath placement on 11/15, and pt is to go to [facility] for dialysis training on 11/16.   High Anion Gap Metabolic Acidosis --2/2 kidney failure and severe uremia    Severe Anemia and thrombocytopenia --Hgb 6.1, plt 137 on presentation. --s/p 2u pRBC.  Hgb stable ~8. --hematology consulted.  Schistocytes on smear most likely secondary to malignant hypertension.   Severe malnutrition in context of chronic illness --supplements per dietician     Discharge Diagnoses:  Active Problems:   Acute respiratory failure with hypoxia (HCC)   Acute kidney injury ([place])   Anemia due to folic acid deficiency   B12 deficiency   Thrombocytopenia (HCC)   Anemia in chronic kidney disease, on chronic dialysis (HCC)   Protein-calorie malnutrition, severe   30 Day Unplanned Readmission Risk Score    Flowsheet Row ED to Hosp-Admission (Current) from 10/26/2021  in [place]  30 Day Unplanned Readmission Risk Score (%) 19.92 Filed at 11/02/2021 1200       This score is the patient's risk of an unplanned readmission within 30 days of being discharged (0 -100%). The score is based on dignosis, age, lab data, medications, orders, and past utilization.   Low:  0-14.9    Medium: 15-21.9   High: 22-29.9   Extreme: 30 and above         Discharge Instructions:  Allergies as of 11/02/2021       Reactions   Ciprofloxacin    Pt denies        Medication List     STOP taking these medications    hydrochlorothiazide 12.5 MG tablet Commonly known as: HYDRODIURIL       TAKE these medications    amLODipine 10 MG tablet Commonly known as: NORVASC Take 1 tablet (10 mg total) by mouth daily.   carvedilol 6.25 MG tablet Commonly known as: COREG Take 1 tablet (6.25 mg total) by mouth 2 (two) times daily with a meal.   cyanocobalamin 1000 MCG tablet Take 1 tablet (1,000 mcg total) by mouth daily. Can take any over-the-counter supplement.   fluticasone 50 MCG/ACT nasal spray Commonly known as: FLONASE SHAKE LIQUID AND USE 2 SPRAYS IN EACH NOSTRIL EVERY DAY   folic acid 1 MG tablet Commonly known as: FOLVITE Take 1 tablet (1 mg total) by mouth daily. Can take any over-the-counter supplement.   hydrOXYzine 10 MG tablet Commonly known as: ATARAX/VISTARIL Take 1 tablet (10 mg total) by mouth 3 (three) times daily as needed.   losartan 100 MG tablet Commonly known as: COZAAR Take 1 tablet (100 mg total) by mouth daily.   multivitamin Tabs tablet Take 1 tablet by mouth at bedtime. Can take any over-the-counter supplement.   oxyCODONE 5 MG immediate release tablet Commonly known as: Oxy IR/ROXICODONE Take 1 tablet (5 mg total) by mouth every 6 (six) hours as needed for up to 5 days for moderate pain or severe pain.         Follow-up Information     Delsa Grana, PA-C Follow up in 1 week(s).   Specialty: Family Medicine Contact information: [address] [place] [address] [phone]                 Allergies  Allergen Reactions   Ciprofloxacin     Pt denies     The results of significant diagnostics from this hospitalization (including imaging, microbiology, ancillary and laboratory) are listed  below for reference.   Consultations:   Procedures/Studies: DG Chest 2 View  Result Date: 10/27/2021 CLINICAL DATA:  Dyspnea. EXAM: CHEST - 2 VIEW COMPARISON:  None. FINDINGS: The heart size and mediastinal contours are within normal limits. Bibasilar opacities are noted concerning for edema or scarring. Focal airspace opacity is noted in left lower lobe concerning for pneumonia. The visualized skeletal structures are unremarkable. IMPRESSION: Bibasilar opacities are noted concerning for edema or scarring. Focal airspace opacity is noted in left lower lobe concerning for pneumonia. Followup PA and lateral chest X-ray is recommended in 3-4 weeks following trial of antibiotic therapy to ensure resolution and exclude underlying malignancy. Electronically Signed   By: Marijo Conception M.D.   On: 10/27/2021 12:55   US RENAL  Result Date: 10/27/2021 CLINICAL DATA:  Acute kidney injury. EXAM: RENAL / URINARY TRACT ULTRASOUND COMPLETE COMPARISON:  None. FINDINGS: Right Kidney: Renal measurements: 9.0  x 3.2 x 4.2 cm = volume: 63.9 mL. Increased parenchymal echogenicity. No mass or hydronephrosis visualized. Left Kidney: Renal measurements: 8.4 x 4.3 x 4.6 cm = volume: 88.7 mL. Increased parenchymal echogenicity. No mass or hydronephrosis visualized. Bladder: Appears normal for degree of bladder distention. Other: Incidental bilateral pleural effusions. IMPRESSION: 1. Echogenic kidneys compatible with medical renal disease. No hydronephrosis. 2. Bilateral pleural effusions. Electronically Signed   By: Logan Bores M.D.   On: 10/27/2021 07:48   DG Chest Port 1 View  Result Date: 10/27/2021 CLINICAL DATA:  Central line placement. EXAM: PORTABLE CHEST 1 VIEW COMPARISON:  October 26, 2021. FINDINGS: Interval placement of left internal jugular catheter with distal tip in expected position of the SVC. No pneumothorax is noted. IMPRESSION: Interval placement of left internal jugular catheter with distal tip in  expected position of the SVC. Electronically Signed   By: Marijo Conception M.D.   On: 10/27/2021 09:38   DG Chest Portable 1 View  Result Date: 10/26/2021 CLINICAL DATA:  Shortness of breath and hypoxia EXAM: PORTABLE CHEST 1 VIEW COMPARISON:  Chest x-ray 10/26/2021 FINDINGS: The heart and mediastinal contours are unchanged. Aortic calcification. Marked interval worsening of diffuse interstitial and airspace opacities. No pulmonary edema. No pleural effusion. No pneumothorax. No acute osseous abnormality. IMPRESSION: Marked interval worsening of diffuse interstitial and airspace opacities. Findings suggestive of pulmonary edema. Superimposed infection/inflammation not excluded. Electronically Signed   By: Iven Finn M.D.   On: 10/26/2021 22:10   ECHOCARDIOGRAM COMPLETE  Result Date: 10/27/2021    ECHOCARDIOGRAM REPORT   Patient Name:   CRISTINA CENICEROS Date of Exam: 10/27/2021 Medical Rec #:  [id number]             Height:       68.0 in Accession #:    [id number]            Weight:       117.9 lb Date of Birth:  1966-04-28              BSA:          1.633 m Patient Age:    55 years              BP:           148/86 mmHg Patient Gender: F                     HR:           90 bpm. Exam Location:  [facility] Procedure: 2D Echo, Color Doppler and Cardiac Doppler Indications:     Elevated troponin  History:         Patient has no prior history of Echocardiogram examinations.                  Risk Factors:Hypertension and Current Smoker.  Sonographer:     Charmayne Sheer Referring Phys:  [place] [facility]UMA Diagnosing Phys: Kate Sable MD  Sonographer Comments: Technically difficult study due to poor echo windows. TDS due to small rib spaces. IMPRESSIONS  1. Left ventricular ejection fraction, by estimation, is 50 to 55%. The left ventricle has low normal function. The left ventricle has no regional wall motion abnormalities. There is mild left ventricular hypertrophy of the septal segment. Left  ventricular diastolic parameters were normal.  2. Right ventricular systolic function is low normal. The right ventricular size is normal.  3. The mitral valve is normal in structure. No evidence  of mitral valve regurgitation.  4. The aortic valve was not well visualized. Aortic valve regurgitation is not visualized.  5. The inferior vena cava is normal in size with <50% respiratory variability, suggesting right atrial pressure of 8 mmHg. FINDINGS  Left Ventricle: Left ventricular ejection fraction, by estimation, is 50 to 55%. The left ventricle has low normal function. The left ventricle has no regional wall motion abnormalities. The left ventricular internal cavity size was normal in size. There is mild left ventricular hypertrophy of the septal segment. Left ventricular diastolic parameters were normal. Right Ventricle: The right ventricular size is normal. No increase in right ventricular wall thickness. Right ventricular systolic function is low normal. Left Atrium: Left atrial size was normal in size. Right Atrium: Right atrial size was normal in size. Pericardium: There is no evidence of pericardial effusion. Mitral Valve: The mitral valve is normal in structure. No evidence of mitral valve regurgitation. MV peak gradient, 3.9 mmHg. The mean mitral valve gradient is 2.0 mmHg. Tricuspid Valve: The tricuspid valve is not well visualized. Tricuspid valve regurgitation is trivial. Aortic Valve: The aortic valve was not well visualized. Aortic valve regurgitation is not visualized. Aortic valve mean gradient measures 6.0 mmHg. Aortic valve peak gradient measures 9.2 mmHg. Aortic valve area, by VTI measures 3.35 cm. Pulmonic Valve: The pulmonic valve was not well visualized. Pulmonic valve regurgitation is not visualized. Aorta: The aortic root is normal in size and structure. Venous: The inferior vena cava is normal in size with less than 50% respiratory variability, suggesting right atrial pressure of 8 mmHg.  IAS/Shunts: No atrial level shunt detected by color flow Doppler.  LEFT VENTRICLE PLAX 2D LVIDd:         4.10 cm   Diastology LVIDs:         3.00 cm   LV e' medial:    6.85 cm/s LV PW:         1.10 cm   LV E/e' medial:  13.1 LV IVS:        1.00 cm   LV e' lateral:   6.96 cm/s LVOT diam:     2.20 cm   LV E/e' lateral: 12.9 LV SV:         85 LV SV Index:   52 LVOT Area:     3.80 cm  RIGHT VENTRICLE RV Basal diam:  3.20 cm LEFT ATRIUM             Index        RIGHT ATRIUM           Index LA diam:        3.80 cm 2.33 cm/m   RA Area:     12.00 cm LA Vol (A2C):   45.8 ml 28.05 ml/m  RA Volume:   27.50 ml  16.84 ml/m LA Vol (A4C):   33.1 ml 20.27 ml/m LA Biplane Vol: 40.8 ml 24.99 ml/m  AORTIC VALVE AV Area (Vmax):    3.28 cm AV Area (Vmean):   2.86 cm AV Area (VTI):     3.35 cm AV Vmax:           152.00 cm/s AV Vmean:          120.000 cm/s AV VTI:            0.254 m AV Peak Grad:      9.2 mmHg AV Mean Grad:      6.0 mmHg LVOT Vmax:  131.00 cm/s LVOT Vmean:        90.200 cm/s LVOT VTI:          0.224 m LVOT/AV VTI ratio: 0.88  AORTA Ao Root diam: 3.40 cm MITRAL VALVE MV Area (PHT): 4.04 cm    SHUNTS MV Area VTI:   3.35 cm    Systemic VTI:  0.22 m MV Peak grad:  3.9 mmHg    Systemic Diam: 2.20 cm MV Mean grad:  2.0 mmHg MV Vmax:       0.98 m/s MV Vmean:      66.4 cm/s MV Decel Time: 188 msec MV E velocity: 90.00 cm/s MV A velocity: 75.40 cm/s MV E/A ratio:  1.19 Kate Sable MD Electronically signed by Kate Sable MD Signature Date/Time: 10/27/2021/2:47:40 PM    Final       Labs: BNP (last 3 results) Recent Labs    10/26/21 2148  BNP >6,144.3*   Basic Metabolic Panel: Recent Labs  Lab 10/27/21 0616 10/27/21 1253 10/28/21 0337 10/28/21 1125 10/30/21 0708 10/31/21 0506 11/01/21 0711 11/02/21 0425  NA 135  --  133* 134* 138 135 140 138  K 4.5  --  3.6 3.7 3.3* 3.0* 4.8 3.1*  CL 96*  --  96* 96* 100 102 105 103  CO2 13*  --  18* 18* 27 25 18* 28  GLUCOSE 92  --  104* 107*  106* 116* 97 98  BUN 166*  --  111* 99* 29* 40* 51* 26*  CREATININE 22.11*  --  16.05* 16.56* 7.02* 8.64* 10.90* 6.53*  CALCIUM 7.7*  --  7.4* 7.6* 7.7* 7.4* 8.6* 7.8*  MG 1.8  --  1.9  --  1.8 1.8 1.9 1.8  PHOS 12.1* 11.5* 8.7*  --   --   --   --   --    Liver Function Tests: Recent Labs  Lab 10/26/21 2148 10/27/21 0233 10/28/21 0337 10/28/21 1125  AST 19 16  --  17  ALT 8 9  --  14  ALKPHOS 35* 34*  --  32*  BILITOT 0.9 0.9  --  0.9  PROT 5.8* 5.4*  --  5.3*  ALBUMIN 3.2* 2.7* 2.9* 2.8*   No results for input(s): LIPASE, AMYLASE in the last 168 hours. No results for input(s): AMMONIA in the last 168 hours. CBC: Recent Labs  Lab 10/26/21 2148 10/27/21 0233 10/27/21 0616 10/28/21 1125 10/29/21 0019 10/29/21 1331 10/30/21 0708 10/31/21 0506 11/01/21 0711 11/02/21 0425  WBC 11.6* 9.6   < > 12.6*  --  9.0 10.3 9.1 7.6 9.6  NEUTROABS 10.7* 8.9*  --  11.6*  --  7.6 8.7*  --   --   --   HGB 6.0* 5.5*   < > 8.0*   < > 8.4* 8.8* 8.4* 8.6* 8.4*  HCT 18.1* 15.9*   < > 23.2*   < > 25.4* 26.8* 26.8* 26.2* 26.3*  MCV 104.0* 103.9*   < > 90.6  --  95.8 98.2 97.5 96.3 98.5  PLT 123* 89*   < > 114*  --  157 180 239 248 264   < > = values in this interval not displayed.   Cardiac Enzymes: No results for input(s): CKTOTAL, CKMB, CKMBINDEX, TROPONINI in the last 168 hours. BNP: Invalid input(s): POCBNP CBG: Recent Labs  Lab 10/27/21 0212 10/27/21 1956  GLUCAP 94 103*   D-Dimer No results for input(s): DDIMER in the last 72 hours. Hgb A1c No results for input(s): HGBA1C  in the last 72 hours. Lipid Profile No results for input(s): CHOL, HDL, LDLCALC, TRIG, CHOLHDL, LDLDIRECT in the last 72 hours. Thyroid function studies No results for input(s): TSH, T4TOTAL, T3FREE, THYROIDAB in the last 72 hours.  Invalid input(s): FREET3 Anemia work up Recent Labs    11/01/21 0845 11/02/21 0425  RETICCTPCT 5.1* 4.2*   Urinalysis No results found for: COLORURINE, APPEARANCEUR,  [place], [place], [place], [place], [place], [place], [place], UROBILINOGEN, NITRITE, LEUKOCYTESUR Sepsis Labs Invalid input(s): PROCALCITONIN,  WBC,  LACTICIDVEN Microbiology Recent Results (from the past 240 hour(s))  Blood Culture (routine x 2)     Status: None   Collection Time: 10/26/21  9:48 PM   Specimen: BLOOD  Result Value Ref Range Status   Specimen Description BLOOD LEFT FOREARM  Final   Special Requests   Final    BOTTLES DRAWN AEROBIC AND ANAEROBIC Blood Culture adequate volume   Culture   Final    NO GROWTH 5 DAYS Performed at [facility], [address] [place], [place] [address]    Report Status 10/31/2021 FINAL  Final  Resp Panel by RT-PCR (Flu A&B, Covid) Nasopharyngeal Swab     Status: None   Collection Time: 10/26/21  9:56 PM   Specimen: Nasopharyngeal Swab; Nasopharyngeal(NP) swabs in vial transport medium  Result Value Ref Range Status   SARS Coronavirus 2 by RT PCR NEGATIVE NEGATIVE Final    Comment: (NOTE) SARS-CoV-2 target nucleic acids are NOT DETECTED.  The SARS-CoV-2 RNA is generally detectable in upper respiratory specimens during the acute phase of infection. The lowest concentration of SARS-CoV-2 viral copies this assay can detect is 138 copies/mL. A negative result does not preclude SARS-Cov-2 infection and should not be used as the sole basis for treatment or other patient management decisions. A negative result may occur with  improper specimen collection/handling, submission of specimen other than nasopharyngeal swab, presence of viral mutation(s) within the areas targeted by this assay, and inadequate number of viral copies(<138 copies/mL). A negative result must be combined with clinical observations, patient history, and epidemiological information. The expected result is Negative.  Fact Sheet for Patients:  EntrepreneurPulse.com.au  Fact Sheet for Healthcare Providers:   IncredibleEmployment.be  This test is no t yet approved or cleared by the [place] FDA and  has been authorized for detection and/or diagnosis of SARS-CoV-2 by FDA under an Emergency Use Authorization (EUA). This EUA will remain  in effect (meaning this test can be used) for the duration of the COVID-19 declaration under Section 564(b)(1) of the Act, 21 U.S.C.section 360bbb-3(b)(1), unless the authorization is terminated  or revoked sooner.       Influenza A by PCR NEGATIVE NEGATIVE Final   Influenza B by PCR NEGATIVE NEGATIVE Final    Comment: (NOTE) The Xpert Xpress SARS-CoV-2/FLU/RSV plus assay is intended as an aid in the diagnosis of influenza from Nasopharyngeal swab specimens and should not be used as a sole basis for treatment. Nasal washings and aspirates are unacceptable for Xpert Xpress SARS-CoV-2/FLU/RSV testing.  Fact Sheet for Patients: EntrepreneurPulse.com.au  Fact Sheet for Healthcare Providers: IncredibleEmployment.be  This test is not yet approved or cleared by the [place] FDA and has been authorized for detection and/or diagnosis of SARS-CoV-2 by FDA under an Emergency Use Authorization (EUA). This EUA will remain in effect (meaning this test can be used) for the duration of the COVID-19 declaration under Section 564(b)(1) of the Act, 21 U.S.C. section 360bbb-3(b)(1), unless the authorization is terminated or revoked.  Performed  at [facility], [address] [place], [place] [address]   Blood Culture (routine x 2)     Status: Abnormal   Collection Time: 10/26/21  9:56 PM   Specimen: BLOOD  Result Value Ref Range Status   Specimen Description   Final    BLOOD RIGHT FOREARM Performed at [facility], [address] [place], [place] [address]    Special Requests   Final    BOTTLES DRAWN AEROBIC AND ANAEROBIC Blood Culture adequate volume Performed at [facility], [address] [place], [place] [address]    Culture  Setup Time   Final    GRAM POSITIVE COCCI Organism ID to follow ANAEROBIC BOTTLE ONLY CRITICAL RESULT CALLED TO, READ BACK BY AND VERIFIED WITH:    Culture (A)  Final    STAPHYLOCOCCUS EPIDERMIDIS THE SIGNIFICANCE OF ISOLATING THIS ORGANISM FROM A SINGLE SET OF BLOOD CULTURES WHEN MULTIPLE SETS ARE DRAWN IS UNCERTAIN. PLEASE NOTIFY THE MICROBIOLOGY DEPARTMENT WITHIN ONE WEEK IF SPECIATION AND SENSITIVITIES ARE REQUIRED. Performed at [facility], [place] [address], [place], [place] [address]    Report Status 10/29/2021 FINAL  Final  Blood Culture ID Panel (Reflexed)     Status: Abnormal   Collection Time: 10/26/21  9:56 PM  Result Value Ref Range Status   Enterococcus faecalis NOT DETECTED NOT DETECTED Final   Enterococcus Faecium NOT DETECTED NOT DETECTED Final   Listeria monocytogenes NOT DETECTED NOT DETECTED Final   Staphylococcus species DETECTED (A) NOT DETECTED Final    Comment: CRITICAL RESULT CALLED TO, READ BACK BY AND VERIFIED WITH: SUSAN WATSON 10/27/21 1554 MU    Staphylococcus aureus (BCID) NOT DETECTED NOT DETECTED Final   Staphylococcus epidermidis DETECTED (A) NOT DETECTED Final    Comment: CRITICAL RESULT CALLED TO, READ BACK BY AND VERIFIED WITH: SUSAN WATSON 10/27/21 1554 MU    Staphylococcus lugdunensis NOT DETECTED NOT DETECTED Final   Streptococcus species NOT DETECTED NOT DETECTED Final   Streptococcus agalactiae NOT DETECTED NOT DETECTED Final   Streptococcus pneumoniae NOT DETECTED NOT DETECTED Final   Streptococcus pyogenes NOT DETECTED NOT DETECTED Final   A.calcoaceticus-baumannii NOT DETECTED NOT DETECTED Final   Bacteroides fragilis NOT DETECTED NOT DETECTED Final   Enterobacterales NOT DETECTED NOT DETECTED Final   Enterobacter cloacae complex NOT DETECTED NOT DETECTED Final   Escherichia coli NOT DETECTED NOT DETECTED Final   Klebsiella aerogenes NOT DETECTED NOT DETECTED  Final   Klebsiella oxytoca NOT DETECTED NOT DETECTED Final   Klebsiella pneumoniae NOT DETECTED NOT DETECTED Final   Proteus species NOT DETECTED NOT DETECTED Final   Salmonella species NOT DETECTED NOT DETECTED Final   Serratia marcescens NOT DETECTED NOT DETECTED Final   Haemophilus influenzae NOT DETECTED NOT DETECTED Final   Neisseria meningitidis NOT DETECTED NOT DETECTED Final   Pseudomonas aeruginosa NOT DETECTED NOT DETECTED Final   Stenotrophomonas maltophilia NOT DETECTED NOT DETECTED Final   Candida albicans NOT DETECTED NOT DETECTED Final   Candida auris NOT DETECTED NOT DETECTED Final   Candida glabrata NOT DETECTED NOT DETECTED Final   Candida krusei NOT DETECTED NOT DETECTED Final   Candida parapsilosis NOT DETECTED NOT DETECTED Final   Candida tropicalis NOT DETECTED NOT DETECTED Final   Cryptococcus neoformans/gattii NOT DETECTED NOT DETECTED Final   Methicillin resistance mecA/C NOT DETECTED NOT DETECTED Final    Comment: Performed at [facility], [address] [place], [place] [address]  MRSA Next Gen by PCR, Nasal  Status: None   Collection Time: 10/27/21  2:37 AM   Specimen: Nasal Mucosa; Nasal Swab  Result Value Ref Range Status   MRSA by PCR Next Gen NOT DETECTED NOT DETECTED Final    Comment: (NOTE) The GeneXpert MRSA Assay (FDA approved for NASAL specimens only), is one component of a comprehensive MRSA colonization surveillance program. It is not intended to diagnose MRSA infection nor to guide or monitor treatment for MRSA infections. Test performance is not FDA approved in patients less than 61 years old. Performed at [facility], [address] [place], [place] [address]      Total time spend on discharging this patient, including the last patient exam, discussing the hospital stay, instructions for ongoing care as it relates to all pertinent caregivers, as well as preparing the medical discharge records, prescriptions,  and/or referrals as applicable, is 35 minutes.    Enzo Bi, MD  [facility] 11/02/2021, 3:21 PM

## 2021-11-02 NOTE — Progress Notes (Signed)
Patient accepted at [facility] for urgent PD start tomorrow at [facility]. Patient is aware.

## 2021-11-02 NOTE — Progress Notes (Signed)
[facility]  PROGRESS NOTE   Subjective:   Hemodialysis treatment yesterday. Tolerated treatment well. UF of 544mL.   PD catheter placement for today.   Anticipate patient start outpatient PD training for tomorrow, Wednesday, November 16.   Objective:  Vital signs in last 24 hours:  Temp:  [97.8 F (36.6 C)-99.2 F (37.3 C)] 97.8 F (36.6 C) (11/15 0939) Pulse Rate:  [71-87] 80 (11/15 0939) Resp:  [15-24] 18 (11/15 0939) BP: (124-174)/(71-104) 154/89 (11/15 0939) SpO2:  [92 %-100 %] 100 % (11/15 0939) Weight:  [53.3 kg-56.3 kg] 56.3 kg (11/15 0939)  Weight change:  Filed Weights   11/01/21 1315 11/02/21 0500 11/02/21 0939  Weight: 53.3 kg 56.3 kg 56.3 kg    Intake/Output: I/O last 3 completed shifts: In: 238 [P.O.:238] Out: 500 [Other:500]   Intake/Output this shift:  No intake/output data recorded.  Physical Exam: General:  No acute distress  Head:  Normocephalic, atraumatic. Moist oral mucosal membranes  Eyes:  Anicteric  Neck:  Supple  Lungs:   Clear to auscultation, normal effort  Heart:  regular  Abdomen:   Soft, nontender, bowel sounds present  Extremities:  peripheral edema.  Neurologic:  Awake, alert, following commands  Skin:  No lesions  Access: Left IJ temp HD catheter    Basic Metabolic Panel: Recent Labs  Lab 10/27/21 0616 10/27/21 1253 10/28/21 0337 10/28/21 1125 10/30/21 0708 10/31/21 0506 11/01/21 0711 11/02/21 0425  NA 135  --  133* 134* 138 135 140 138  K 4.5  --  3.6 3.7 3.3* 3.0* 4.8 3.1*  CL 96*  --  96* 96* 100 102 105 103  CO2 13*  --  18* 18* 27 25 18* 28  GLUCOSE 92  --  104* 107* 106* 116* 97 98  BUN 166*  --  111* 99* 29* 40* 51* 26*  CREATININE 22.11*  --  16.05* 16.56* 7.02* 8.64* 10.90* 6.53*  CALCIUM 7.7*  --  7.4* 7.6* 7.7* 7.4* 8.6* 7.8*  MG 1.8  --  1.9  --  1.8 1.8 1.9 1.8  PHOS 12.1* 11.5* 8.7*  --   --   --   --   --      CBC: Recent Labs  Lab 10/26/21 2148 10/27/21 0233 10/27/21 0616  10/28/21 1125 10/29/21 0019 10/29/21 1331 10/30/21 0708 10/31/21 0506 11/01/21 0711 11/02/21 0425  WBC 11.6* 9.6   < > 12.6*  --  9.0 10.3 9.1 7.6 9.6  NEUTROABS 10.7* 8.9*  --  11.6*  --  7.6 8.7*  --   --   --   HGB 6.0* 5.5*   < > 8.0*   < > 8.4* 8.8* 8.4* 8.6* 8.4*  HCT 18.1* 15.9*   < > 23.2*   < > 25.4* 26.8* 26.8* 26.2* 26.3*  MCV 104.0* 103.9*   < > 90.6  --  95.8 98.2 97.5 96.3 98.5  PLT 123* 89*   < > 114*  --  157 180 239 248 264   < > = values in this interval not displayed.      Urinalysis: No results for input(s): COLORURINE, LABSPEC, PHURINE, GLUCOSEU, HGBUR, BILIRUBINUR, KETONESUR, PROTEINUR, UROBILINOGEN, NITRITE, LEUKOCYTESUR in the last 72 hours.  Invalid input(s): APPERANCEUR    Imaging: No results found.   Medications:    ceFAZolin     [MAR Hold]  ceFAZolin (ANCEF) IV     [MAR Hold] promethazine (PHENERGAN) injection (IM or IVPB)      [MAR Hold] amLODipine  10 mg Oral Daily   [MAR Hold] carvedilol  6.25 mg Oral BID WC   [MAR Hold] Chlorhexidine Gluconate Cloth  6 each Topical Q0600   [MAR Hold] feeding supplement  1 Container Oral TID BM   [MAR Hold] folic acid  1 mg Oral Daily   [MAR Hold] losartan  100 mg Oral Daily   [MAR Hold] melatonin  5 mg Oral QHS   [MAR Hold] multivitamin  1 tablet Oral QHS   [MAR Hold] vitamin B-12  1,000 mcg Oral Daily    Assessment/ Plan:     Active Problems:   Acute respiratory failure with hypoxia (HCC)   Acute kidney injury ([place])   Anemia due to folic acid deficiency   B12 deficiency   Thrombocytopenia (HCC)   Anemia in chronic kidney disease, on chronic dialysis (HCC)   Protein-calorie malnutrition, severe  Joy Ray is a 55 y.o. black female with hypertension admitted with hypertensive urgency.   End stage renal disease: completed four hemodialysis treatments.  - Outpatient planning for peritoneal dialysis. PD catheter to be placed by Dr. Dahlia Byes.  - Please remove left IJ HD catheter  today.   Hypertension: 137/76 on hemodialysis treatment. Malignant hypertension on admission. - Continue losartan, carvedilol and amlodipine.   Anemia with acute kidney disease: hemoglobin 8.4. appreciate hematology input. Schistocytes on smear most likely secondary to malignant hypertension. Status post 1 unit PRBC transfusion.  - IV iron and ESA as outpatient.   Secondary Hyperparathyroidism: PTH 348, phosphorus elevated at 8.7 on 11/10. Not currently on a binder.    LOS: [place], [facility] 11/15/202210:04 AM

## 2021-11-02 NOTE — Discharge Instructions (Signed)
Peritoneal Dialysis Catheter Placement, Care After The following information offers guidance on how to care for yourself after your procedure. Your health care provider may also give you more specific instructions. If you have problems or questions, contact your health care provider. What can I expect after the procedure? After the procedure, it is common to have some pain or discomfort in your abdomen and your incision area. You may need to wait 2 weeks after your procedure before you can start peritoneal dialysis treatment. If you need dialysis before that time, your health care provider may begin peritoneal dialysis treatment early or offer kidney dialysis treatments (hemodialysis) until you heal. Follow these instructions at home: Incision care Follow instructions from your health care provider about how to take care of your incision or incisions. Make sure you: Wash your hands with soap and water for at least 20 seconds before and after you change your bandage (dressing). If soap and water are not available, use hand sanitizer. Change your dressing only as told by your health care provider. Your health care provider may tell you not to touch or change your dressing. Leave stitches (sutures), staples, skin glue, or adhesive strips in place. These skin closures may need to stay in place for 2 weeks or longer. If adhesive strip edges start to loosen and curl up, you may trim the loose edges. Do not remove adhesive strips completely unless your health care provider tells you to do that. Check your incision areas every day for signs of infection. If you were instructed not to touch or change your dressing, look at your dressing for signs of infection. Check for: Redness, swelling, or more pain. Fluid or blood. Warmth. Pus or a bad smell. Medicines Take over-the-counter and prescription medicines only as told by your health care provider. If you were prescribed an antibiotic medicine, use it as told  by your health care provider. Do not stop using the antibiotic even if you start to feel better. Ask your health care provider if the medicine prescribed to you requires you to avoid driving or using machinery. Driving Do not drive or ride in a car until your health care provider approves. Your seat belt could move the catheter out of position or cause irritation by rubbing on your incision. Activity Rest and limit your activity. Do not lift anything that is heavier than 10 lb (4.5 kg), or the limit that you are told, until your health care provider says that it is safe. Return to your normal activities as told by your health care provider. Ask your health care provider what activities are safe for you. Managing constipation Your condition may cause constipation. To prevent or treat constipation, you may need to: Drink enough fluid to keep your urine pale yellow. Take over-the-counter or prescription medicines. Eat foods that are high in fiber, such as beans, whole grains, and fresh fruits and vegetables. Limit foods that are high in fat and processed sugars, such as fried or sweet foods. General instructions Do not use any products that contain nicotine or tobacco. These products include cigarettes, chewing tobacco, and vaping devices, such as e-cigarettes. If you need help quitting, ask your health care provider. Follow instructions from your health care provider about eating or drinking restrictions. Do not take baths, swim, or use a hot tub until your health care provider approves. Ask your health care provider if you may take showers. You may only be allowed to take sponge baths. Wear loose-fitting clothing that keeps the catheter  covered so that it cannot get caught on something. Keep your catheter clean and dry. Keep all follow-up visits. This is important. Contact a health care provider if: You have a fever or chills. You have warmth, redness, swelling, or more pain around an  incision. You have fluid or blood coming from an incision. You have pus or a bad smell coming from an incision. You cannot eat or drink without vomiting. Get help right away if: You have problems breathing. You are confused. You have trouble speaking. You have severe pain in your abdomen that does not get better with treatment. You have bright red blood in your stool (feces), or your stool is dark black and looks like tar. These symptoms may represent a serious problem that is an emergency. Do not wait to see if the symptoms will go away. Get medical help right away. Call your local emergency services (911 in the [place] Do not drive yourself to the hospital. Summary After the procedure, it is common to have some pain or discomfort in your abdomen, your incision area, or both. You may have to wait 2 weeks after your procedure before you can start peritoneal dialysis treatment. Check your incision area every day for signs of infection. Get medical help right away if you have severe pain in your abdomen that does not get better with treatment. This information is not intended to replace advice given to you by your health care provider. Make sure you discuss any questions you have with your health care provider. Document Revised: 07/23/2020 Document Reviewed: 07/23/2020 Elsevier Patient Education  2022 [employer].

## 2021-11-02 NOTE — Op Note (Signed)
Laparoscopic placement of peritoneal Dialysis catheter ( Total three cuffs) Laparoscopic Omentopexy Open Incisional hernia with Mesh Using Ventralex 4.3 cms patch ( via different incision)   Pre-operative Diagnosis: ESRD, incisional hernia   Post-operative Diagnosis: same     Surgeon: Caroleen Hamman, MD FACS   Anesthesia: Gen. with endotracheal tube      Findings: Catheter within pelvis Good return after infusing Peritoneal cavity  1.5 cm Incisional periumbilical hernia  Estimated Blood Loss: 5cc              Complications: none     Procedure Details  The patient was seen again in the Holding Room. The benefits, complications, treatment options, and expected outcomes were discussed with the patient. The risks of bleeding, infection, recurrence of symptoms, failure to resolve symptoms, catheter malfunction bowel injury, any of which could require further surgery were reviewed with the patient. The likelihood of improving the patient's symptoms with return to their baseline status is good.  The patient and/or family concurred with the proposed plan, giving informed consent.  The patient was taken to Operating Room, identified and the procedure verified. A Time Out was held and the above information confirmed.   Prior to the induction of general anesthesia, antibiotic prophylaxis was administered. VTE prophylaxis was in place. General endotracheal anesthesia was then administered and tolerated well. After the induction, the abdomen was prepped with Chloraprep and draped in the sterile fashion. The patient was positioned in the supine position.   Periumbilical incision created, an incisional hernia was found related to prior tubal ligation. ElectroCautrery was used to dissect the sac. Hernia was identified and measured 1.5 cms, I proceeded to placed 4.3 cm mesh and attach it to the abdominal wall in 4 quadrants using 0 ethibonds. THe incisional defect closed with multiple ethibon  sutures.  Optiview technique used in the LLQ, no injuries identified. We confirmed a good hernia repair w mesh laying flat against the abd wall. Pneumoperitoneum was obtained w/o hemodynamic changes. We placed an additional laparoscopic ports under direct visualization.  The anterior rectus fascia identified and incised, rectus muscle identified and retracted laterally, using a port We were able to tunnel into the retro rectus space for about 6 cms.    We were able to thread the catheter via the laparoscopic port within the retrorectus space in the standard fashion.  Under direct visualization we made sure that the coil portion of the catheter layed within the pelvis without any kinks. I was able to also perform a counterincision in the left subcostal area and Lovena Le an additional extension of the catheter . The  Extension had 2 cuffs and I was able to connect it to parse together with the titanium connector in the standard fashion.  There was no evidence of any kinks.  The exit site was to the left upper quadrant. We instilled heparinized saline a liter into the pelvis and we had very good return. SHe did have a tongue of omentum, attention was then turned to the omentum and using a PMI device we were able to perform an omentopexy and tacked the omentum to the abdominal wall using 2 interrupted 2-0 Vicryl sutures in the standard fashion. All skin incisions  were infiltrated with a liposomal Marcaine. 4-0 subcuticular Monocryl was used to close the skin. Dermabond was  applied. Sterile dressing applied to the catheter. The patient was then extubated and brought to the recovery room in stable condition. Sponge, lap, and needle counts were correct at closure and  at the conclusion of the case.               Caroleen Hamman, MD, FACS

## 2021-11-03 ENCOUNTER — Telehealth: Payer: Self-pay

## 2021-11-03 ENCOUNTER — Encounter: Payer: Self-pay | Admitting: Surgery

## 2021-11-03 NOTE — Telephone Encounter (Signed)
Transition Care Management Unsuccessful Follow-up Telephone Call  Date of discharge and from where:  11/02/21-[facility]  Attempts:  1st Attempt  Reason for unsuccessful TCM follow-up call:  Left voice message    

## 2021-11-04 NOTE — Telephone Encounter (Signed)
Transition Care Management Unsuccessful Follow-up Telephone Call  Date of discharge and from where:  11/02/21  Attempts:  2nd Attempt  Reason for unsuccessful TCM follow-up call:  Left voice message

## 2021-11-16 ENCOUNTER — Inpatient Hospital Stay: Payer: 59 | Admitting: Family Medicine

## 2021-11-16 NOTE — Anesthesia Postprocedure Evaluation (Signed)
Anesthesia Post Note  Patient: Asusena Sigley  Procedure(s) Performed: LAPAROSCOPIC INSERTION CONTINUOUS AMBULATORY PERITONEAL DIALYSIS  (CAPD) CATHETER (Abdomen) INSERTION OF MESH (Abdomen)  Patient location during evaluation: PACU Anesthesia Type: General Level of consciousness: awake and alert Pain management: pain level controlled Vital Signs Assessment: post-procedure vital signs reviewed and stable Respiratory status: spontaneous breathing, nonlabored ventilation, respiratory function stable and patient connected to nasal cannula oxygen Cardiovascular status: blood pressure returned to baseline and stable Postop Assessment: no apparent nausea or vomiting Anesthetic complications: no   No notable events documented.   Last Vitals:  Vitals:   11/02/21 1355 11/02/21 1416  BP:  (!) 142/92  Pulse: 74 (!) 106  Resp: 19 17  Temp:  36.6 C  SpO2: 94% 91%    Last Pain:  Vitals:   11/02/21 1416  TempSrc: Oral  PainSc:                  Molli Barrows

## 2021-11-17 ENCOUNTER — Other Ambulatory Visit: Payer: Self-pay | Admitting: Family Medicine

## 2021-11-17 DIAGNOSIS — I10 Essential (primary) hypertension: Secondary | ICD-10-CM

## 2021-11-22 LAB — BLOOD GAS, ARTERIAL
Acid-base deficit: 21.2 mmol/L — ABNORMAL HIGH (ref 0.0–2.0)
Bicarbonate: 6.2 mmol/L — ABNORMAL LOW (ref 20.0–28.0)
FIO2: 1
O2 Saturation: 88.6 %
Patient temperature: 37
pCO2 arterial: 19 mmHg — CL (ref 32.0–48.0)

## 2021-12-30 ENCOUNTER — Other Ambulatory Visit: Payer: Self-pay

## 2021-12-30 ENCOUNTER — Ambulatory Visit (INDEPENDENT_AMBULATORY_CARE_PROVIDER_SITE_OTHER): Payer: 59

## 2021-12-30 DIAGNOSIS — Z3042 Encounter for surveillance of injectable contraceptive: Secondary | ICD-10-CM

## 2021-12-30 MED ORDER — MEDROXYPROGESTERONE ACETATE 150 MG/ML IM SUSP
150.0000 mg | Freq: Once | INTRAMUSCULAR | Status: AC
  Administered 2021-12-30: 150 mg via INTRAMUSCULAR

## 2022-01-01 ENCOUNTER — Telehealth: Payer: Self-pay | Admitting: Family Medicine

## 2022-01-01 DIAGNOSIS — I10 Essential (primary) hypertension: Secondary | ICD-10-CM

## 2022-01-01 NOTE — Telephone Encounter (Signed)
Dc'd 11/02/21 by Enzo Bi MD "stop taking at discharge"  Requested Prescriptions  Refused Prescriptions Disp Refills   hydrochlorothiazide (HYDRODIURIL) 12.5 MG tablet [Pharmacy Med Name: HYDROCHLOROTHIAZIDE 12.5 MG TB] 30 tablet 0    Sig: TAKE 1 TABLET (12.5 MG TOTAL) BY MOUTH DAILY. FOR BLOOD PRESSURE     Cardiovascular: Diuretics - Thiazide Failed - 01/01/2022  1:45 PM      Failed - Ca in normal range and within 360 days    Calcium  Date Value Ref Range Status  11/02/2021 7.8 (L) 8.9 - 10.3 mg/dL Final         Failed - Cr in normal range and within 360 days    Creatinine, Ser  Date Value Ref Range Status  11/02/2021 6.53 (H) 0.44 - 1.00 mg/dL Final         Failed - K in normal range and within 360 days    Potassium  Date Value Ref Range Status  11/02/2021 3.1 (L) 3.5 - 5.1 mmol/L Final         Failed - Last BP in normal range    BP Readings from Last 1 Encounters:  11/02/21 (!) 142/92         Passed - Na in normal range and within 360 days    Sodium  Date Value Ref Range Status  11/02/2021 138 135 - 145 mmol/L Final  08/30/2018 139 134 - 144 mmol/L Final         Passed - Valid encounter within last 6 months    Recent Outpatient Visits          2 months ago Hypertension, uncontrolled   [facility] Steele Sizer, MD   9 months ago Annual physical exam   [facility] Trinna Post, [place]   2 years ago Adult general medical exam   [facility] Delsa Grana, PA-C   3 years ago Encounter for surveillance of injectable contraceptive   [place], Satira Anis, MD   3 years ago Routine general medical examination at a health care facility   [place], Bethel Born, NP      Future Appointments            In 2 months Delsa Grana, PA-C [facility], [facility]

## 2022-01-03 NOTE — Telephone Encounter (Signed)
Medication was d/c.

## 2022-01-04 ENCOUNTER — Telehealth: Payer: Self-pay | Admitting: Family Medicine

## 2022-01-04 DIAGNOSIS — I10 Essential (primary) hypertension: Secondary | ICD-10-CM

## 2022-01-05 ENCOUNTER — Other Ambulatory Visit: Payer: Self-pay

## 2022-01-05 MED ORDER — CARVEDILOL 6.25 MG PO TABS: 6.2500 mg | ORAL_TABLET | Freq: Two times a day (BID) | ORAL | 2 refills | Status: DC

## 2022-01-05 MED ORDER — LOSARTAN POTASSIUM 100 MG PO TABS: 100.0000 mg | ORAL_TABLET | Freq: Every day | ORAL | 2 refills | Status: DC

## 2022-01-05 MED ORDER — AMLODIPINE BESYLATE 10 MG PO TABS: 10.0000 mg | ORAL_TABLET | Freq: Every day | ORAL | 2 refills | Status: DC

## 2022-01-05 NOTE — Telephone Encounter (Signed)
Lvm and mychart message letting pt know to reschedule her April physical appointment (we will be closing early due to Good Friday) and rescheduling with a different provider due to [place] not knowing if Joy Ray will be back into the office.

## 2022-01-05 NOTE — Telephone Encounter (Signed)
Patient called in to inform Dr Ancil Boozer that she need her refills on amLODipine (NORVASC) 10 MG tablet, carvedilol (COREG) 6.25 MG tablet, losartan (COZAAR) 100 MG tablet please advise say pharmacy need new Rx

## 2022-01-06 ENCOUNTER — Other Ambulatory Visit: Payer: Self-pay | Admitting: Family Medicine

## 2022-03-22 ENCOUNTER — Ambulatory Visit: Payer: 59

## 2022-03-23 ENCOUNTER — Telehealth: Payer: Self-pay

## 2022-03-23 NOTE — Telephone Encounter (Signed)
Copied from [place] [phone]. Topic: General - Other >> Mar 23, 2022  9:18 AM Loma [place] wrote: Reason for CRM: Pt is frustated and worried about Depo  @ CPE this Monday. Her pharmacy is out + no Refill on Depo. She wants to make sure office sees incoming fax from pharmacy for her refill and to make sure it will be approved so she can pu this weekend as holiday. Please look out for refill request coming from pharmacy pt states. Also called office but pt wants note on file

## 2022-03-24 ENCOUNTER — Telehealth: Payer: Self-pay

## 2022-03-24 DIAGNOSIS — Z3042 Encounter for surveillance of injectable contraceptive: Secondary | ICD-10-CM

## 2022-03-24 NOTE — Telephone Encounter (Signed)
Pt requesting a refill. Please Advice ?

## 2022-03-25 ENCOUNTER — Other Ambulatory Visit: Payer: Self-pay | Admitting: Family Medicine

## 2022-03-25 ENCOUNTER — Encounter: Payer: 59 | Admitting: Family Medicine

## 2022-03-25 NOTE — Telephone Encounter (Signed)
Called pt to let her know and no answer, left vm to call back. ?

## 2022-03-25 NOTE — Telephone Encounter (Signed)
Informed pt of Dr.Sowles message pt was upset. ?

## 2022-03-28 ENCOUNTER — Ambulatory Visit (INDEPENDENT_AMBULATORY_CARE_PROVIDER_SITE_OTHER): Payer: 59 | Admitting: Family Medicine

## 2022-03-28 ENCOUNTER — Encounter: Payer: Self-pay | Admitting: Family Medicine

## 2022-03-28 VITALS — BP 138/86 | HR 86 | Temp 97.6°F | Resp 16 | Ht 67.0 in | Wt 116.7 lb

## 2022-03-28 DIAGNOSIS — J301 Allergic rhinitis due to pollen: Secondary | ICD-10-CM | POA: Diagnosis not present

## 2022-03-28 DIAGNOSIS — Z0000 Encounter for general adult medical examination without abnormal findings: Secondary | ICD-10-CM

## 2022-03-28 DIAGNOSIS — D631 Anemia in chronic kidney disease: Secondary | ICD-10-CM

## 2022-03-28 DIAGNOSIS — N186 End stage renal disease: Secondary | ICD-10-CM | POA: Diagnosis not present

## 2022-03-28 DIAGNOSIS — Z87891 Personal history of nicotine dependence: Secondary | ICD-10-CM

## 2022-03-28 DIAGNOSIS — N946 Dysmenorrhea, unspecified: Secondary | ICD-10-CM

## 2022-03-28 DIAGNOSIS — J9601 Acute respiratory failure with hypoxia: Secondary | ICD-10-CM

## 2022-03-28 DIAGNOSIS — I10 Essential (primary) hypertension: Secondary | ICD-10-CM

## 2022-03-28 DIAGNOSIS — E43 Unspecified severe protein-calorie malnutrition: Secondary | ICD-10-CM

## 2022-03-28 DIAGNOSIS — Z8742 Personal history of other diseases of the female genital tract: Secondary | ICD-10-CM | POA: Diagnosis not present

## 2022-03-28 DIAGNOSIS — E538 Deficiency of other specified B group vitamins: Secondary | ICD-10-CM

## 2022-03-28 DIAGNOSIS — D529 Folate deficiency anemia, unspecified: Secondary | ICD-10-CM

## 2022-03-28 DIAGNOSIS — D696 Thrombocytopenia, unspecified: Secondary | ICD-10-CM

## 2022-03-28 DIAGNOSIS — Z992 Dependence on renal dialysis: Secondary | ICD-10-CM

## 2022-03-28 NOTE — Progress Notes (Signed)
? ?BP 138/86   Pulse 86   Temp 97.6 ?F (36.4 ?C) (Oral)   Resp 16   Ht '5\' 7"'$  (1.702 m)   Wt 116 lb 11.2 oz (52.9 kg)   BMI 18.28 kg/m?   ? ?Subjective:  ? ? Patient ID: Joy Ray, female    DOB: 02/17/1966, 56 y.o.   MRN: [id number] ? ?HPI: ?Joy Ray is a 56 y.o. female presenting on 03/28/2022 for comprehensive medical examination. Current medical complaints include:none ? ?Hypertension: ?- Medications: amlodipine, coreg, losartan ?- Compliance: good ?- Denies any SOB, CP, vision changes, LE edema, medication SEs, or symptoms of hypotension ? ?Acute resp failure, ESRD - previous hospitalization 10/2021 for progressive SOB with uncontrolled HTN, progressed to flash pulmonary edema and acute hypoxic respiratory failure. BNP >45000. Started on intermittent HD with PD cath placement 11/15. Doing well with PD, sees nephrologist twice per month with recent visit few weeks ago with labs. UTD with PNA vaccine. ? ?Allergic rhinitis - on flonase.  ? ?Vit B12 deficiency - on oral supplementation. ? ?Dysmenorrhea - on depo shot for management. Wanting refill. Has had some hot flashes and night sweats. Hasn't had period in a few years. ? ?She currently lives with: alone ?Menopausal Symptoms: yes ? ?Depression Screen done today and results listed below:  ? ?  03/28/2022  ?  2:45 PM 10/26/2021  ?  1:42 PM 03/24/2021  ?  3:21 PM 11/12/2019  ?  8:27 AM 08/30/2018  ?  3:49 PM  ?Depression screen PHQ 2/9  ?Decreased Interest 0 0 0 0 0  ?Down, Depressed, Hopeless 0 0 0 0 0  ?PHQ - 2 Score 0 0 0 0 0  ?Altered sleeping 0 0  1   ?Tired, decreased energy 0 0  0   ?Change in appetite 0 0  0   ?Feeling bad or failure about yourself  0 0  0   ?Trouble concentrating 0 0  0   ?Moving slowly or fidgety/restless 0 0  0   ?Suicidal thoughts 0 0  0   ?PHQ-9 Score 0 0  1   ?Difficult doing work/chores Not difficult at all   Not difficult at all   ? ? ? ? ?Past Medical History:  ?Past Medical History:  ?Diagnosis Date  ? Acute  kidney injury ([place])   ? Allergy   ? Cigarette smoker   ? HTN, goal below 140/90   ? Second degree hemorrhoids   ? ? ?Surgical History:  ?Past Surgical History:  ?Procedure Laterality Date  ? CAPD INSERTION N/A 11/02/2021  ? Procedure: LAPAROSCOPIC INSERTION CONTINUOUS AMBULATORY PERITONEAL DIALYSIS  (CAPD) CATHETER;  Surgeon: Jules Husbands, MD;  Location: [facility];  Service: General;  Laterality: N/A;  ? COLONOSCOPY WITH PROPOFOL N/A 04/21/2016  ? Procedure: COLONOSCOPY WITH PROPOFOL;  Surgeon: Lucilla Lame, MD;  Location: [facility];  Service: Endoscopy;  Laterality: N/A;  ? INSERTION OF MESH  11/02/2021  ? Procedure: INSERTION OF MESH;  Surgeon: Jules Husbands, MD;  Location: [facility];  Service: General;;  ? [place]  ? After G1P1001  ? ? ?Medications:  ?Current Outpatient Medications on File Prior to Visit  ?Medication Sig  ? amLODipine (NORVASC) 10 MG tablet Take 1 tablet (10 mg total) by mouth daily.  ? carvedilol (COREG) 6.25 MG tablet Take 1 tablet (6.25 mg total) by mouth 2 (two) times daily with a meal.  ? fluticasone (FLONASE) 50 MCG/ACT nasal spray SHAKE LIQUID  AND USE 2 SPRAYS IN EACH NOSTRIL EVERY DAY  ? folic acid (FOLVITE) 1 MG tablet Take 1 tablet (1 mg total) by mouth daily. Can take any over-the-counter supplement.  ? hydrOXYzine (ATARAX/VISTARIL) 10 MG tablet Take 1 tablet (10 mg total) by mouth 3 (three) times daily as needed.  ? losartan (COZAAR) 100 MG tablet Take 1 tablet (100 mg total) by mouth daily.  ? multivitamin (RENA-VIT) TABS tablet Take 1 tablet by mouth at bedtime. Can take any over-the-counter supplement.  ? vitamin B-12 1000 MCG tablet Take 1 tablet (1,000 mcg total) by mouth daily. Can take any over-the-counter supplement.  ? ?No current facility-administered medications on file prior to visit.  ? ? ?Allergies:  ?Allergies  ?Allergen Reactions  ? Ciprofloxacin   ?  Pt denies  ? ? ?Social History:  ?Social History  ? ?Socioeconomic History  ? Marital status:  Single  ?  Spouse name: Not on file  ? Number of children: 1  ? Years of education: Not on file  ? Highest education level: Not on file  ?Occupational History  ? Not on file  ?Tobacco Use  ? Smoking status: Light Smoker  ?  Packs/day: 0.50  ?  Years: 20.00  ?  Pack years: 10.00  ?  Types: Cigarettes  ? Smokeless tobacco: Never  ? Tobacco comments:  ?  pt has cut back to 1 or 2 cigs/day  ?Vaping Use  ? Vaping Use: Never used  ?Substance and Sexual Activity  ? Alcohol use: Not Currently  ?  Comment:    ? Drug use: No  ? Sexual activity: Not Currently  ?Other Topics Concern  ? Not on file  ?Social History Narrative  ? Not on file  ? ?Social Determinants of Health  ? ?Financial Resource Strain: Low Risk   ? Difficulty of Paying Living Expenses: Not hard at all  ?Food Insecurity: No Food Insecurity  ? Worried About Charity fundraiser in the Last Year: Never true  ? Ran Out of Food in the Last Year: Never true  ?Transportation Needs: No Transportation Needs  ? Lack of Transportation (Medical): No  ? Lack of Transportation (Non-Medical): No  ?Physical Activity: Insufficiently Active  ? Days of Exercise per Week: 3 days  ? Minutes of Exercise per Session: 20 min  ?Stress: No Stress Concern Present  ? Feeling of Stress : Only a little  ?Social Connections: Moderately Isolated  ? Frequency of Communication with Friends and Family: More than three times a week  ? Frequency of Social Gatherings with Friends and Family: More than three times a week  ? Attends Religious Services: 1 to 4 times per year  ? Active Member of Clubs or Organizations: No  ? Attends Archivist Meetings: Never  ? Marital Status: Never married  ?Intimate Partner Violence: Not At Risk  ? Fear of Current or Ex-Partner: No  ? Emotionally Abused: No  ? Physically Abused: No  ? Sexually Abused: No  ? ?Social History  ? ?Tobacco Use  ?Smoking Status Light Smoker  ? Packs/day: 0.50  ? Years: 20.00  ? Pack years: 10.00  ? Types: Cigarettes  ?Smokeless  Tobacco Never  ?Tobacco Comments  ? pt has cut back to 1 or 2 cigs/day  ? ?Social History  ? ?Substance and Sexual Activity  ?Alcohol Use Not Currently  ? Comment:    ? ? ?Family History:  ?Family History  ?Problem Relation Age of Onset  ? Cancer Mother   ?  breast  ? Breast cancer Mother 89  ? Breast cancer Maternal Aunt   ? ? ?Past medical history, surgical history, medications, allergies, family history and social history reviewed with patient today and changes made to appropriate areas of the chart.  ? ?   ?Objective:  ?  ?BP 138/86   Pulse 86   Temp 97.6 ?F (36.4 ?C) (Oral)   Resp 16   Ht '5\' 7"'$  (1.702 m)   Wt 116 lb 11.2 oz (52.9 kg)   BMI 18.28 kg/m?   ?Wt Readings from Last 3 Encounters:  ?03/28/22 116 lb 11.2 oz (52.9 kg)  ?11/02/21 124 lb 1.9 oz (56.3 kg)  ?10/26/21 118 lb (53.5 kg)  ?  ?Physical Exam ?Vitals reviewed.  ?Constitutional:   ?   General: She is not in acute distress. ?   Appearance: She is not ill-appearing.  ?HENT:  ?   Head: Normocephalic.  ?   Right Ear: Tympanic membrane, ear canal and external ear normal.  ?   Left Ear: Tympanic membrane, ear canal and external ear normal.  ?   Nose: Nose normal.  ?   Mouth/Throat:  ?   Mouth: Mucous membranes are moist.  ?   Pharynx: Oropharynx is clear.  ?Eyes:  ?   Extraocular Movements: Extraocular movements intact.  ?   Pupils: Pupils are equal, round, and reactive to light.  ?Cardiovascular:  ?   Rate and Rhythm: Normal rate and regular rhythm.  ?   Heart sounds: Normal heart sounds. No murmur heard. ?Pulmonary:  ?   Effort: Pulmonary effort is normal.  ?   Breath sounds: Normal breath sounds.  ?Abdominal:  ?   General: Bowel sounds are normal.  ?   Palpations: Abdomen is soft.  ?   Comments: PD cath site clean, dry, intact.  ?Musculoskeletal:     ?   General: Normal range of motion.  ?   Right lower leg: No edema.  ?   Left lower leg: No edema.  ?Skin: ?   General: Skin is warm and dry.  ?Neurological:  ?   Mental Status: She is alert and  oriented to person, place, and time. Mental status is at baseline.  ?   Gait: Gait normal.  ?Psychiatric:     ?   Mood and Affect: Mood normal.     ?   Behavior: Behavior normal.  ? ? ?Results for orders placed or pe

## 2022-03-28 NOTE — Patient Instructions (Signed)
It was great to see you! ? ?Our plans for today:  ?- We are getting a copy of your recent labs and will let you know if we need to come back for additional labs.  ?- We ordered your bone density scan today.  ?- If your periods return and are painful after stopping the depo, let us know. ? ?Take care and seek immediate care sooner if you develop any concerns.  ? ?Dr. Ky Barban ? ?

## 2022-03-29 ENCOUNTER — Encounter: Payer: Self-pay | Admitting: Family Medicine

## 2022-03-29 DIAGNOSIS — N186 End stage renal disease: Secondary | ICD-10-CM | POA: Insufficient documentation

## 2022-03-29 DIAGNOSIS — N946 Dysmenorrhea, unspecified: Secondary | ICD-10-CM | POA: Insufficient documentation

## 2022-03-29 NOTE — Assessment & Plan Note (Signed)
At goal today. Continue current management. ?

## 2022-03-29 NOTE — Assessment & Plan Note (Signed)
Given age, menopausal symptoms and ESRD, recommend stopping depo. If painful periods return after stopping, recommend consideration of IUD.  ?

## 2022-03-29 NOTE — Assessment & Plan Note (Signed)
Doing well on current regimen, no changes made today. 

## 2022-03-29 NOTE — Assessment & Plan Note (Signed)
Doing well with PD. Will obtain recent labs from dialysis center. UTD with PNA vaccine. Obtaining DEXA scan. BP controlled. Continue to follow with nephrology. ?

## 2022-03-29 NOTE — Assessment & Plan Note (Signed)
Has cut back to 1-2 cigarettes per day. Encouraged ongoing efforts with cessation. ?

## 2022-03-29 NOTE — Assessment & Plan Note (Signed)
Will obtain recent labs from dialysis center. ?

## 2022-04-20 ENCOUNTER — Other Ambulatory Visit: Payer: Self-pay | Admitting: Family Medicine

## 2022-05-11 ENCOUNTER — Other Ambulatory Visit: Payer: Self-pay | Admitting: Family Medicine

## 2022-05-17 ENCOUNTER — Telehealth: Payer: Self-pay

## 2022-05-17 NOTE — Telephone Encounter (Signed)
Did not see any phone number. If the call back they can click the CARE EVERYWHERE and see all the medical records

## 2022-05-17 NOTE — Telephone Encounter (Signed)
Copied from [place] [id number]. Topic: General - Other >> May 17, 2022 12:21 PM Bayard Beaver wrote: Reason for CRM: Vin from [facility] called in , says sent med records over on 05/19 and hasnt gotten status back. Please call back

## 2022-05-26 ENCOUNTER — Telehealth: Payer: Self-pay | Admitting: Family Medicine

## 2022-05-26 NOTE — Telephone Encounter (Signed)
Madhu calling from [facility] is calling to check on the status of the psychical Assessment [employer] Form. Form was Faxed on 05/09/22. Cb- [phone]

## 2022-05-26 NOTE — Telephone Encounter (Signed)
Spoke to [place] he stated they have not received anything from [place] and he will refax papers that need to be filled out.

## 2022-05-27 ENCOUNTER — Encounter: Payer: Self-pay | Admitting: Family Medicine

## 2022-05-27 ENCOUNTER — Ambulatory Visit (INDEPENDENT_AMBULATORY_CARE_PROVIDER_SITE_OTHER): Payer: Medicare Other | Admitting: Family Medicine

## 2022-05-27 VITALS — BP 98/62 | HR 84 | Temp 97.4°F | Resp 16 | Ht 69.0 in | Wt 117.3 lb

## 2022-05-27 DIAGNOSIS — Z992 Dependence on renal dialysis: Secondary | ICD-10-CM

## 2022-05-27 DIAGNOSIS — N186 End stage renal disease: Secondary | ICD-10-CM | POA: Diagnosis not present

## 2022-05-27 DIAGNOSIS — Z0289 Encounter for other administrative examinations: Secondary | ICD-10-CM

## 2022-05-30 ENCOUNTER — Encounter: Payer: Self-pay | Admitting: Family Medicine

## 2022-05-30 NOTE — Progress Notes (Signed)
Patient ID: Joy Ray, female    DOB: 1966/03/24, 56 y.o.   MRN: [id number]  PCP: [facility]  Chief Complaint  Patient presents with   Paperwork    Pt needs paperwork filled out due to her dialysis(Long term disability).    Subjective:   Joy Ray is a 56 y.o. female, presents to clinic with CC of the following:  HPI  Pt presents for forms/paperwork for ADA accomodations Pt last seen by me 3 years ago New kidney failure as of Nov 2022 on PD No nephrology notes or recent OV available nor labs Reviewed prior hospitalization - she was taken to the ED via ems for CP SOB, admitted for acute respiratory failure, renal failure and CHF - after several years of uncontrolled HTN in primary care which she refused to treat or even do f/up labs for  She states she has been out of work since Nov She has to do dialysis at home for many hours of the day, she has pain, isn't able to lift anything per nephro She has abd pain that is chronic due to PD She denies any other physical limitations Says she did not have [facility] or PT come evaluate her after her hospitalizations - no HFU done here Last OV was CPE by [facility] primary care provider - first time PD/renal failure on chart  Paperwork today is ADA forms, there is not job description with it She states we can call her job and ask for job description because she is not going to call them or talk to them States she worked from home prior to hospitalization - 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   Patient Active Problem List   Diagnosis Date Noted   ESRD on peritoneal dialysis ([place]) 03/29/2022   Dysmenorrhea 03/29/2022   Protein-calorie malnutrition, severe 11/01/2021   Anemia due to folic acid deficiency    B12 deficiency    Thrombocytopenia (HCC)    Anemia in chronic kidney disease, on chronic dialysis ([place])    Acute respiratory failure with hypoxia ([place]) 10/26/2021   History of smoking  10-25 pack years 04/18/2018   Allergic rhinitis 10/13/2015   H/O abnormal cervical Papanicolaou smear 10/13/2015   Hypertension goal BP (blood pressure) < 140/90 10/13/2015      Current Outpatient Medications:    fluticasone (FLONASE) 50 MCG/ACT nasal spray, SHAKE LIQUID AND USE 2 SPRAYS IN EACH NOSTRIL EVERY DAY, Disp: 16 g, Rfl: 3   furosemide (LASIX) 80 MG tablet, Take 80 mg by mouth daily., Disp: , Rfl:    losartan (COZAAR) 100 MG tablet, TAKE 1 TABLET(100 MG) BY MOUTH DAILY, Disp: 30 tablet, Rfl: 2   multivitamin (RENA-VIT) TABS tablet, Take 1 tablet by mouth at bedtime. Can take any over-the-counter supplement., Disp: , Rfl: 0   potassium chloride SA (KLOR-CON M) 20 MEQ tablet, Take 20 mEq by mouth 2 (two) times daily., Disp: , Rfl:    vitamin B-12 1000 MCG tablet, Take 1 tablet (1,000 mcg total) by mouth daily. Can take any over-the-counter supplement., Disp: , Rfl:    amLODipine (NORVASC) 10 MG tablet, TAKE 1 TABLET(10 MG) BY MOUTH DAILY (Patient not taking: Reported on 05/27/2022), Disp: 30 tablet, Rfl: 5   carvedilol (COREG) 6.25 MG tablet, Take 1 tablet (6.25 mg total) by mouth 2 (two) times daily with a meal., Disp: 60 tablet, Rfl: 2   folic acid (FOLVITE) 1 MG tablet, Take 1 tablet (1 mg total) by  mouth daily. Can take any over-the-counter supplement. (Patient not taking: Reported on 05/27/2022), Disp: , Rfl:    hydrOXYzine (ATARAX/VISTARIL) 10 MG tablet, Take 1 tablet (10 mg total) by mouth 3 (three) times daily as needed. (Patient not taking: Reported on 05/27/2022), Disp: 30 tablet, Rfl: 0   Allergies  Allergen Reactions   Ciprofloxacin     Pt denies     Social History   Tobacco Use   Smoking status: Light Smoker    Packs/day: 0.50    Years: 20.00    Total pack years: 10.00    Types: Cigarettes   Smokeless tobacco: Never   Tobacco comments:    pt has cut back to 1 or 2 cigs/day  Vaping Use   Vaping Use: Never used  Substance Use Topics   Alcohol use: Not Currently     Comment:     Drug use: No      Chart Review Today: I personally reviewed active problem list, medication list, allergies, family history, social history, health maintenance, notes from last encounter, lab results, imaging with the patient/caregiver today.   Review of Systems  Constitutional: Negative.   HENT: Negative.    Eyes: Negative.   Respiratory: Negative.    Cardiovascular: Negative.   Gastrointestinal: Negative.   Endocrine: Negative.   Genitourinary: Negative.   Musculoskeletal: Negative.   Skin: Negative.   Allergic/Immunologic: Negative.   Neurological: Negative.   Hematological: Negative.   Psychiatric/Behavioral: Negative.    All other systems reviewed and are negative.      Objective:   Vitals:   05/27/22 1329  BP: 98/62  Pulse: 84  Resp: 16  Temp: (!) 97.4 F (36.3 C)  TempSrc: Oral  Weight: 117 lb 4.8 oz (53.2 kg)  Height: '5\' 9"'$  (1.753 m)    Body mass index is 17.32 kg/m.  Physical Exam Vitals and nursing note reviewed.  Constitutional:      General: She is not in acute distress.    Appearance: Normal appearance. She is well-developed. She is not ill-appearing, toxic-appearing or diaphoretic.  HENT:     Head: Normocephalic and atraumatic.     Nose: Nose normal.  Eyes:     General:        Right eye: No discharge.        Left eye: No discharge.     Conjunctiva/sclera: Conjunctivae normal.  Neck:     Trachea: No tracheal deviation.  Cardiovascular:     Rate and Rhythm: Normal rate and regular rhythm.  Pulmonary:     Effort: Pulmonary effort is normal. No respiratory distress.     Breath sounds: No stridor.  Musculoskeletal:        General: Normal range of motion.  Skin:    General: Skin is warm and dry.     Findings: No rash.  Neurological:     Mental Status: She is alert.     Motor: No abnormal muscle tone.     Coordination: Coordination normal.  Psychiatric:        Behavior: Behavior normal.      Results for orders  placed or performed during the hospital encounter of 10/26/21  Resp Panel by RT-PCR (Flu A&B, Covid) Nasopharyngeal Swab   Specimen: Nasopharyngeal Swab; Nasopharyngeal(NP) swabs in vial transport medium  Result Value Ref Range   SARS Coronavirus 2 by RT PCR NEGATIVE NEGATIVE   Influenza A by PCR NEGATIVE NEGATIVE   Influenza B by PCR NEGATIVE NEGATIVE  Blood Culture (routine x 2)  Specimen: BLOOD  Result Value Ref Range   Specimen Description      BLOOD RIGHT FOREARM Performed at [facility] [address] [place], [place] [address]    Special Requests      BOTTLES DRAWN AEROBIC AND ANAEROBIC Blood Culture adequate volume Performed at [facility], [address] [place], [place] [address]    Culture  Setup Time      GRAM POSITIVE COCCI Organism ID to follow ANAEROBIC BOTTLE ONLY CRITICAL RESULT CALLED TO, READ BACK BY AND VERIFIED WITH:    Culture (A)     STAPHYLOCOCCUS EPIDERMIDIS THE SIGNIFICANCE OF ISOLATING THIS ORGANISM FROM A SINGLE SET OF BLOOD CULTURES WHEN MULTIPLE SETS ARE DRAWN IS UNCERTAIN. PLEASE NOTIFY THE MICROBIOLOGY DEPARTMENT WITHIN ONE WEEK IF SPECIATION AND SENSITIVITIES ARE REQUIRED. Performed at [facility], [place] [address], [place], [place] [address]    Report Status 10/29/2021 FINAL   Blood Culture (routine x 2)   Specimen: BLOOD  Result Value Ref Range   Specimen Description BLOOD LEFT FOREARM    Special Requests      BOTTLES DRAWN AEROBIC AND ANAEROBIC Blood Culture adequate volume   Culture      NO GROWTH 5 DAYS Performed at [facility], [address], [place],  [address]    Report Status 10/31/2021 FINAL   MRSA Next Gen by PCR, Nasal   Specimen: Nasal Mucosa; Nasal Swab  Result Value Ref Range   MRSA by PCR Next Gen NOT DETECTED NOT DETECTED  Blood Culture ID Panel (Reflexed)  Result Value Ref Range   Enterococcus faecalis NOT DETECTED NOT DETECTED   Enterococcus Faecium NOT DETECTED NOT  DETECTED   Listeria monocytogenes NOT DETECTED NOT DETECTED   Staphylococcus species DETECTED (A) NOT DETECTED   Staphylococcus aureus (BCID) NOT DETECTED NOT DETECTED   Staphylococcus epidermidis DETECTED (A) NOT DETECTED   Staphylococcus lugdunensis NOT DETECTED NOT DETECTED   Streptococcus species NOT DETECTED NOT DETECTED   Streptococcus agalactiae NOT DETECTED NOT DETECTED   Streptococcus pneumoniae NOT DETECTED NOT DETECTED   Streptococcus pyogenes NOT DETECTED NOT DETECTED   A.calcoaceticus-baumannii NOT DETECTED NOT DETECTED   Bacteroides fragilis NOT DETECTED NOT DETECTED   Enterobacterales NOT DETECTED NOT DETECTED   Enterobacter cloacae complex NOT DETECTED NOT DETECTED   Escherichia coli NOT DETECTED NOT DETECTED   Klebsiella aerogenes NOT DETECTED NOT DETECTED   Klebsiella oxytoca NOT DETECTED NOT DETECTED   Klebsiella pneumoniae NOT DETECTED NOT DETECTED   Proteus species NOT DETECTED NOT DETECTED   Salmonella species NOT DETECTED NOT DETECTED   Serratia marcescens NOT DETECTED NOT DETECTED   Haemophilus influenzae NOT DETECTED NOT DETECTED   Neisseria meningitidis NOT DETECTED NOT DETECTED   Pseudomonas aeruginosa NOT DETECTED NOT DETECTED   Stenotrophomonas maltophilia NOT DETECTED NOT DETECTED   Candida albicans NOT DETECTED NOT DETECTED   Candida auris NOT DETECTED NOT DETECTED   Candida glabrata NOT DETECTED NOT DETECTED   Candida krusei NOT DETECTED NOT DETECTED   Candida parapsilosis NOT DETECTED NOT DETECTED   Candida tropicalis NOT DETECTED NOT DETECTED   Cryptococcus neoformans/gattii NOT DETECTED NOT DETECTED   Methicillin resistance mecA/C NOT DETECTED NOT DETECTED  Lactic acid, plasma  Result Value Ref Range   Lactic Acid, Venous 3.9 (HH) 0.5 - 1.9 mmol/L  Comprehensive metabolic panel  Result Value Ref Range   Sodium 133 (L) 135 - 145 mmol/L   Potassium 5.0 3.5 - 5.1 mmol/L   Chloride 99 98 -  111 mmol/L   CO2 8 (L) 22 - 32 mmol/L   Glucose, Bld  236 (H) 70 - 99 mg/dL   BUN 143 (H) 6 - 20 mg/dL   Creatinine, Ser 21.86 (H) 0.44 - 1.00 mg/dL   Calcium 8.4 (L) 8.9 - 10.3 mg/dL   Total Protein 5.8 (L) 6.5 - 8.1 g/dL   Albumin 3.2 (L) 3.5 - 5.0 g/dL   AST 19 15 - 41 U/L   ALT 8 0 - 44 U/L   Alkaline Phosphatase 35 (L) 38 - 126 U/L   Total Bilirubin 0.9 0.3 - 1.2 mg/dL   GFR, Estimated 2 (L) >60 mL/min   Anion gap 26 (H) 5 - 15  CBC WITH DIFFERENTIAL  Result Value Ref Range   WBC 11.6 (H) 4.0 - 10.5 K/uL   RBC 1.74 (L) 3.87 - 5.11 MIL/uL   Hemoglobin 6.0 (L) 12.0 - 15.0 g/dL   HCT 18.1 (L) 36.0 - 46.0 %   MCV 104.0 (H) 80.0 - 100.0 fL   MCH 34.5 (H) 26.0 - 34.0 pg   MCHC 33.1 30.0 - 36.0 g/dL   RDW 20.3 (H) 11.5 - 15.5 %   Platelets 123 (L) 150 - 400 K/uL   nRBC 0.0 0.0 - 0.2 %   Neutrophils Relative % 93 %   Neutro Abs 10.7 (H) 1.7 - 7.7 K/uL   Lymphocytes Relative 4 %   Lymphs Abs 0.5 (L) 0.7 - 4.0 K/uL   Monocytes Relative 2 %   Monocytes Absolute 0.3 0.1 - 1.0 K/uL   Eosinophils Relative 0 %   Eosinophils Absolute 0.0 0.0 - 0.5 K/uL   Basophils Relative 0 %   Basophils Absolute 0.0 0.0 - 0.1 K/uL   Immature Granulocytes 1 %   Abs Immature Granulocytes 0.06 0.00 - 0.07 K/uL  Protime-INR  Result Value Ref Range   Prothrombin Time 15.0 11.4 - 15.2 seconds   INR 1.2 0.8 - 1.2  APTT  Result Value Ref Range   aPTT 31 24 - 36 seconds  Brain natriuretic peptide  Result Value Ref Range   B Natriuretic Peptide >4,500.0 (H) 0.0 - 100.0 pg/mL  hCG, quantitative, pregnancy  Result Value Ref Range   hCG, Beta Chain, Quant, S 4 <5 mIU/mL  Blood gas, arterial  Result Value Ref Range   FIO2 1.00    Delivery systems NON-REBREATHER OXYGEN MASK    pCO2 arterial 19 (LL) 32.0 - 48.0 mmHg   Bicarbonate 6.2 (L) 20.0 - 28.0 mmol/L   Acid-base deficit 21.2 (H) 0.0 - 2.0 mmol/L   O2 Saturation 88.6 %   Patient temperature 37.0    Collection site REVIEWED BY    Sample type PASS   Procalcitonin  Result Value Ref Range    Procalcitonin 1.49 ng/mL  CBC  Result Value Ref Range   WBC 11.4 (H) 4.0 - 10.5 K/uL   RBC 1.56 (L) 3.87 - 5.11 MIL/uL   Hemoglobin 5.1 (L) 12.0 - 15.0 g/dL   HCT 15.0 (L) 36.0 - 46.0 %   MCV 96.2 80.0 - 100.0 fL   MCH 32.7 26.0 - 34.0 pg   MCHC 34.0 30.0 - 36.0 g/dL   RDW 20.2 (H) 11.5 - 15.5 %   Platelets 90 (L) 150 - 400 K/uL   nRBC 0.0 0.0 - 0.2 %  Basic metabolic panel  Result Value Ref Range   Sodium 135 135 - 145 mmol/L   Potassium 4.5 3.5 - 5.1 mmol/L   Chloride 96 (  L) 98 - 111 mmol/L   CO2 13 (L) 22 - 32 mmol/L   Glucose, Bld 92 70 - 99 mg/dL   BUN 166 (H) 6 - 20 mg/dL   Creatinine, Ser 22.11 (H) 0.44 - 1.00 mg/dL   Calcium 7.7 (L) 8.9 - 10.3 mg/dL   GFR, Estimated 2 (L) >60 mL/min   Anion gap 26 (H) 5 - 15  Blood gas, arterial  Result Value Ref Range   FIO2 1.00    Delivery systems BILEVEL POSITIVE AIRWAY PRESSURE    Inspiratory PAP 12    Expiratory PAP 8    pH, Arterial 7.21 (L) 7.350 - 7.450   pCO2 arterial <19.0 (LL) 32.0 - 48.0 mmHg   pO2, Arterial 414 (H) 83.0 - 108.0 mmHg   Bicarbonate 6.8 (L) 20.0 - 28.0 mmol/L   Acid-base deficit 19.3 (H) 0.0 - 2.0 mmol/L   O2 Saturation 100.0 %   Patient temperature 37.0    Collection site A-LINE    Sample type ARTERIAL DRAW    Mechanical Rate 10   Magnesium  Result Value Ref Range   Magnesium 1.8 1.7 - 2.4 mg/dL  Phosphorus  Result Value Ref Range   Phosphorus 12.1 (H) 2.5 - 4.6 mg/dL  Lactic acid, plasma  Result Value Ref Range   Lactic Acid, Venous 1.9 0.5 - 1.9 mmol/L  Cortisol  Result Value Ref Range   Cortisol, Plasma 29.2 ug/dL  CBC with Differential/Platelet  Result Value Ref Range   WBC 9.6 4.0 - 10.5 K/uL   RBC 1.53 (L) 3.87 - 5.11 MIL/uL   Hemoglobin 5.5 (L) 12.0 - 15.0 g/dL   HCT 15.9 (L) 36.0 - 46.0 %   MCV 103.9 (H) 80.0 - 100.0 fL   MCH 35.9 (H) 26.0 - 34.0 pg   MCHC 34.6 30.0 - 36.0 g/dL   RDW 19.0 (H) 11.5 - 15.5 %   Platelets 89 (L) 150 - 400 K/uL   nRBC 0.0 0.0 - 0.2 %    Neutrophils Relative % 92 %   Neutro Abs 8.9 (H) 1.7 - 7.7 K/uL   Lymphocytes Relative 3 %   Lymphs Abs 0.3 (L) 0.7 - 4.0 K/uL   Monocytes Relative 4 %   Monocytes Absolute 0.4 0.1 - 1.0 K/uL   Eosinophils Relative 0 %   Eosinophils Absolute 0.0 0.0 - 0.5 K/uL   Basophils Relative 0 %   Basophils Absolute 0.0 0.0 - 0.1 K/uL   WBC Morphology MORPHOLOGY UNREMARKABLE    Smear Review PLATELET COUNT CONFIRMED BY SMEAR    Immature Granulocytes 1 %   Abs Immature Granulocytes 0.05 0.00 - 0.07 K/uL   Schistocytes PRESENT    Burr Cells PRESENT   Comprehensive metabolic panel  Result Value Ref Range   Sodium 133 (L) 135 - 145 mmol/L   Potassium 4.9 3.5 - 5.1 mmol/L   Chloride 100 98 - 111 mmol/L   CO2 10 (L) 22 - 32 mmol/L   Glucose, Bld 104 (H) 70 - 99 mg/dL   BUN 147 (H) 6 - 20 mg/dL   Creatinine, Ser 22.60 (H) 0.44 - 1.00 mg/dL   Calcium 7.9 (L) 8.9 - 10.3 mg/dL   Total Protein 5.4 (L) 6.5 - 8.1 g/dL   Albumin 2.7 (L) 3.5 - 5.0 g/dL   AST 16 15 - 41 U/L   ALT 9 0 - 44 U/L   Alkaline Phosphatase 34 (L) 38 - 126 U/L   Total Bilirubin 0.9 0.3 - 1.2 mg/dL  GFR, Estimated 2 (L) >60 mL/min   Anion gap 23 (H) 5 - 15  Magnesium  Result Value Ref Range   Magnesium 2.0 1.7 - 2.4 mg/dL  Glucose, capillary  Result Value Ref Range   Glucose-Capillary 94 70 - 99 mg/dL   Comment 1 Notify RN    Comment 2 Document in Chart   TSH  Result Value Ref Range   TSH 5.343 (H) 0.350 - 4.500 uIU/mL  Pathologist smear review  Result Value Ref Range   Path Review Blood smear is reviewed.   Vitamin B12  Result Value Ref Range   Vitamin B-12 208 180 - 914 pg/mL  Folate  Result Value Ref Range   Folate 7.0 >5.9 ng/mL  Iron and TIBC  Result Value Ref Range   Iron 32 28 - 170 ug/dL   TIBC 262 250 - 450 ug/dL   Saturation Ratios 12 10.4 - 31.8 %   UIBC 230 ug/dL  Ferritin  Result Value Ref Range   Ferritin 387 (H) 11 - 307 ng/mL  Reticulocytes  Result Value Ref Range   Retic Ct Pct 11.6 (H)  0.4 - 3.1 %   RBC. 1.55 (L) 3.87 - 5.11 MIL/uL   Retic Count, Absolute 179.8 19.0 - 186.0 K/uL   Immature Retic Fract 22.4 (H) 2.3 - 15.9 %  ANCA Profile  Result Value Ref Range   Anti-MPO Antibodies <0.2 0.0 - 0.9 units   Anti-PR3 Antibodies <0.2 0.0 - 0.9 units   C-ANCA <1:20 Neg:<1:20 titer   P-ANCA <1:20 Neg:<1:20 titer   Atypical P-ANCA titer <1:20 Neg:<1:20 titer  Rheumatoid factor  Result Value Ref Range   Rhuematoid fact SerPl-aCnc <10.0 <14.0 IU/mL  Sedimentation rate  Result Value Ref Range   Sed Rate 59 (H) 0 - 30 mm/hr  Hemoglobin and hematocrit, blood  Result Value Ref Range   Hemoglobin 7.2 (L) 12.0 - 15.0 g/dL   HCT 21.4 (L) 36.0 - 46.0 %  Hemoglobin and hematocrit, blood  Result Value Ref Range   Hemoglobin 6.8 (L) 12.0 - 15.0 g/dL   HCT 19.4 (L) 36.0 - 46.0 %  ADAMTS13 Activity  Result Value Ref Range   Adamts 13 Activity 36.6 (L) >66.8 %  ANA w/Reflex if Positive  Result Value Ref Range   Anti Nuclear Antibody (ANA) Negative Negative  C3 complement  Result Value Ref Range   C3 Complement 66 (L) 82 - 167 mg/dL  C4 complement  Result Value Ref Range   Complement C4, Body Fluid 21 12 - 38 mg/dL  Protein electrophoresis, serum  Result Value Ref Range   Total Protein ELP 4.7 (L) 6.0 - 8.5 g/dL   Albumin ELP 2.7 (L) 2.9 - 4.4 g/dL   Alpha-1-Globulin 0.3 0.0 - 0.4 g/dL   Alpha-2-Globulin 0.5 0.4 - 1.0 g/dL   Beta Globulin 0.6 (L) 0.7 - 1.3 g/dL   Gamma Globulin 0.6 0.4 - 1.8 g/dL   M-Spike, % Not Observed Not Observed g/dL   SPE Interp. Comment    Comment Comment    Globulin, Total 2.0 (L) 2.2 - 3.9 g/dL   A/G Ratio 1.4 0.7 - 1.7  Parathyroid hormone, intact (no Ca)  Result Value Ref Range   PTH 348 (H) 15 - 65 pg/mL  Glomerular basement membrane antibodies  Result Value Ref Range   GBM Ab <0.2 0.0 - 0.9 units  Phosphorus  Result Value Ref Range   Phosphorus 11.5 (H) 2.5 - 4.6 mg/dL  Hepatitis B surface antigen  Result Value Ref Range    Hepatitis B Surface Ag NON REACTIVE NON REACTIVE  Hepatitis B surface antibody  Result Value Ref Range   Hep B S Ab NON REACTIVE NON REACTIVE  Hepatitis B surface antibody,quantitative  Result Value Ref Range   Hepatitis B-Post <3.1 (L) Immunity>9.9 mIU/mL  Hepatitis C antibody  Result Value Ref Range   HCV Ab NON REACTIVE NON REACTIVE  Hemoglobin and hematocrit, blood  Result Value Ref Range   Hemoglobin 6.2 (L) 12.0 - 15.0 g/dL   HCT 17.7 (L) 36.0 - 46.0 %  Renal function panel  Result Value Ref Range   Sodium 133 (L) 135 - 145 mmol/L   Potassium 3.6 3.5 - 5.1 mmol/L   Chloride 96 (L) 98 - 111 mmol/L   CO2 18 (L) 22 - 32 mmol/L   Glucose, Bld 104 (H) 70 - 99 mg/dL   BUN 111 (H) 6 - 20 mg/dL   Creatinine, Ser 16.05 (H) 0.44 - 1.00 mg/dL   Calcium 7.4 (L) 8.9 - 10.3 mg/dL   Phosphorus 8.7 (H) 2.5 - 4.6 mg/dL   Albumin 2.9 (L) 3.5 - 5.0 g/dL   GFR, Estimated 2 (L) >60 mL/min   Anion gap 19 (H) 5 - 15  Magnesium  Result Value Ref Range   Magnesium 1.9 1.7 - 2.4 mg/dL  Hemoglobin and hematocrit, blood  Result Value Ref Range   Hemoglobin 7.9 (L) 12.0 - 15.0 g/dL   HCT 22.3 (L) 36.0 - 46.0 %  Glucose, capillary  Result Value Ref Range   Glucose-Capillary 103 (H) 70 - 99 mg/dL   Comment 1 Notify RN    Comment 2 Document in Chart   CBC with Differential/Platelet  Result Value Ref Range   WBC 12.6 (H) 4.0 - 10.5 K/uL   RBC 2.56 (L) 3.87 - 5.11 MIL/uL   Hemoglobin 8.0 (L) 12.0 - 15.0 g/dL   HCT 23.2 (L) 36.0 - 46.0 %   MCV 90.6 80.0 - 100.0 fL   MCH 31.3 26.0 - 34.0 pg   MCHC 34.5 30.0 - 36.0 g/dL   RDW 21.3 (H) 11.5 - 15.5 %   Platelets 114 (L) 150 - 400 K/uL   nRBC 0.0 0.0 - 0.2 %   Neutrophils Relative % 92 %   Neutro Abs 11.6 (H) 1.7 - 7.7 K/uL   Lymphocytes Relative 3 %   Lymphs Abs 0.4 (L) 0.7 - 4.0 K/uL   Monocytes Relative 4 %   Monocytes Absolute 0.5 0.1 - 1.0 K/uL   Eosinophils Relative 0 %   Eosinophils Absolute 0.0 0.0 - 0.5 K/uL   Basophils Relative 0  %   Basophils Absolute 0.0 0.0 - 0.1 K/uL   Smear Review Normal platelet morphology    Immature Granulocytes 1 %   Abs Immature Granulocytes 0.13 (H) 0.00 - 0.07 K/uL   Schistocytes PRESENT    Polychromasia PRESENT   Comprehensive metabolic panel  Result Value Ref Range   Sodium 134 (L) 135 - 145 mmol/L   Potassium 3.7 3.5 - 5.1 mmol/L   Chloride 96 (L) 98 - 111 mmol/L   CO2 18 (L) 22 - 32 mmol/L   Glucose, Bld 107 (H) 70 - 99 mg/dL   BUN 99 (H) 6 - 20 mg/dL   Creatinine, Ser 16.56 (H) 0.44 - 1.00 mg/dL   Calcium 7.6 (L) 8.9 - 10.3 mg/dL   Total Protein 5.3 (L) 6.5 - 8.1 g/dL   Albumin 2.8 (L) 3.5 - 5.0 g/dL  AST 17 15 - 41 U/L   ALT 14 0 - 44 U/L   Alkaline Phosphatase 32 (L) 38 - 126 U/L   Total Bilirubin 0.9 0.3 - 1.2 mg/dL   GFR, Estimated 2 (L) >60 mL/min   Anion gap 20 (H) 5 - 15  Retic Panel  Result Value Ref Range   Retic Ct Pct 6.0 (H) 0.4 - 3.1 %   RBC. 2.55 (L) 3.87 - 5.11 MIL/uL   Retic Count, Absolute 153.3 19.0 - 186.0 K/uL   Immature Retic Fract 16.6 (H) 2.3 - 15.9 %   Reticulocyte Hemoglobin 33.4 >27.9 pg  Lactate dehydrogenase  Result Value Ref Range   LDH 450 (H) 98 - 192 U/L  Pathologist smear review  Result Value Ref Range   Path Review Blood smear is reviewed.   Haptoglobin  Result Value Ref Range   Haptoglobin 15 (L) 33 - 346 mg/dL  APTT  Result Value Ref Range   aPTT 30 24 - 36 seconds  Protime-INR  Result Value Ref Range   Prothrombin Time 14.2 11.4 - 15.2 seconds   INR 1.1 0.8 - 1.2  HIV Antibody (routine testing w rflx)  Result Value Ref Range   HIV Screen 4th Generation wRfx Non Reactive Non Reactive  HCV RNA Diagnosis, NAA  Result Value Ref Range   HCV RNA, Quantitation HCV Not Detected IU/mL   Test Information: Comment   Hepatitis B surface antibody  Result Value Ref Range   Hep B S Ab NON REACTIVE NON REACTIVE  Hepatitis B surface antigen  Result Value Ref Range   Hepatitis B Surface Ag NON REACTIVE NON REACTIVE  Vitamin B12   Result Value Ref Range   Vitamin B-12 215 180 - 914 pg/mL  Folate, serum, performed at [facility] lab  Result Value Ref Range   Folate 4.9 (L) >5.9 ng/mL  Hepatitis B core antibody, total  Result Value Ref Range   Hep B Core Total Ab NON REACTIVE NON REACTIVE  Hemoglobin and hematocrit, blood  Result Value Ref Range   Hemoglobin 8.1 (L) 12.0 - 15.0 g/dL   HCT 24.0 (L) 36.0 - 46.0 %  Hemoglobin and hematocrit, blood  Result Value Ref Range   Hemoglobin 7.9 (L) 12.0 - 15.0 g/dL   HCT 24.1 (L) 36.0 - 46.0 %  CBC with Differential  Result Value Ref Range   WBC 9.0 4.0 - 10.5 K/uL   RBC 2.65 (L) 3.87 - 5.11 MIL/uL   Hemoglobin 8.4 (L) 12.0 - 15.0 g/dL   HCT 25.4 (L) 36.0 - 46.0 %   MCV 95.8 80.0 - 100.0 fL   MCH 31.7 26.0 - 34.0 pg   MCHC 33.1 30.0 - 36.0 g/dL   RDW 21.8 (H) 11.5 - 15.5 %   Platelets 157 150 - 400 K/uL   nRBC 0.0 0.0 - 0.2 %   Neutrophils Relative % 84 %   Neutro Abs 7.6 1.7 - 7.7 K/uL   Lymphocytes Relative 9 %   Lymphs Abs 0.8 0.7 - 4.0 K/uL   Monocytes Relative 6 %   Monocytes Absolute 0.6 0.1 - 1.0 K/uL   Eosinophils Relative 0 %   Eosinophils Absolute 0.0 0.0 - 0.5 K/uL   Basophils Relative 0 %   Basophils Absolute 0.0 0.0 - 0.1 K/uL   WBC Morphology MORPHOLOGY UNREMARKABLE    Smear Review MORPHOLOGY UNREMARKABLE    Immature Granulocytes 1 %   Abs Immature Granulocytes 0.05 0.00 - 0.07 K/uL  Schistocytes PRESENT   Lactate dehydrogenase  Result Value Ref Range   LDH 339 (H) 98 - 192 U/L  Retic Panel  Result Value Ref Range   Retic Ct Pct 7.1 (H) 0.4 - 3.1 %   RBC. 2.62 (L) 3.87 - 5.11 MIL/uL   Retic Count, Absolute 185.5 19.0 - 186.0 K/uL   Immature Retic Fract 15.9 2.3 - 15.9 %   Reticulocyte Hemoglobin 31.4 >27.9 pg  CBC with Differential/Platelet  Result Value Ref Range   WBC 10.3 4.0 - 10.5 K/uL   RBC 2.73 (L) 3.87 - 5.11 MIL/uL   Hemoglobin 8.8 (L) 12.0 - 15.0 g/dL   HCT 26.8 (L) 36.0 - 46.0 %   MCV 98.2 80.0 - 100.0 fL   MCH 32.2  26.0 - 34.0 pg   MCHC 32.8 30.0 - 36.0 g/dL   RDW 20.9 (H) 11.5 - 15.5 %   Platelets 180 150 - 400 K/uL   nRBC 0.0 0.0 - 0.2 %   Neutrophils Relative % 84 %   Neutro Abs 8.7 (H) 1.7 - 7.7 K/uL   Lymphocytes Relative 9 %   Lymphs Abs 1.0 0.7 - 4.0 K/uL   Monocytes Relative 6 %   Monocytes Absolute 0.6 0.1 - 1.0 K/uL   Eosinophils Relative 0 %   Eosinophils Absolute 0.0 0.0 - 0.5 K/uL   Basophils Relative 0 %   Basophils Absolute 0.0 0.0 - 0.1 K/uL   Immature Granulocytes 1 %   Abs Immature Granulocytes 0.05 0.00 - 0.07 K/uL  ADAMTS13 Activity reflex  Result Value Ref Range   ADAMTS13 Activity comment Comment   Basic metabolic panel  Result Value Ref Range   Sodium 138 135 - 145 mmol/L   Potassium 3.3 (L) 3.5 - 5.1 mmol/L   Chloride 100 98 - 111 mmol/L   CO2 27 22 - 32 mmol/L   Glucose, Bld 106 (H) 70 - 99 mg/dL   BUN 29 (H) 6 - 20 mg/dL   Creatinine, Ser 7.02 (H) 0.44 - 1.00 mg/dL   Calcium 7.7 (L) 8.9 - 10.3 mg/dL   GFR, Estimated 6 (L) >60 mL/min   Anion gap 11 5 - 15  Magnesium  Result Value Ref Range   Magnesium 1.8 1.7 - 2.4 mg/dL  Basic metabolic panel  Result Value Ref Range   Sodium 135 135 - 145 mmol/L   Potassium 3.0 (L) 3.5 - 5.1 mmol/L   Chloride 102 98 - 111 mmol/L   CO2 25 22 - 32 mmol/L   Glucose, Bld 116 (H) 70 - 99 mg/dL   BUN 40 (H) 6 - 20 mg/dL   Creatinine, Ser 8.64 (H) 0.44 - 1.00 mg/dL   Calcium 7.4 (L) 8.9 - 10.3 mg/dL   GFR, Estimated 5 (L) >60 mL/min   Anion gap 8 5 - 15  Magnesium  Result Value Ref Range   Magnesium 1.8 1.7 - 2.4 mg/dL  Retic Panel  Result Value Ref Range   Retic Ct Pct 5.7 (H) 0.4 - 3.1 %   RBC. 2.64 (L) 3.87 - 5.11 MIL/uL   Retic Count, Absolute 151.3 19.0 - 186.0 K/uL   Immature Retic Fract 10.4 2.3 - 15.9 %   Reticulocyte Hemoglobin 28.9 >27.9 pg  Lactate dehydrogenase  Result Value Ref Range   LDH 269 (H) 98 - 192 U/L  CBC  Result Value Ref Range   WBC 9.1 4.0 - 10.5 K/uL   RBC 2.75 (L) 3.87 - 5.11 MIL/uL    Hemoglobin  8.4 (L) 12.0 - 15.0 g/dL   HCT 26.8 (L) 36.0 - 46.0 %   MCV 97.5 80.0 - 100.0 fL   MCH 30.5 26.0 - 34.0 pg   MCHC 31.3 30.0 - 36.0 g/dL   RDW 20.2 (H) 11.5 - 15.5 %   Platelets 239 150 - 400 K/uL   nRBC 0.0 0.0 - 0.2 %  Basic metabolic panel  Result Value Ref Range   Sodium 140 135 - 145 mmol/L   Potassium 4.8 3.5 - 5.1 mmol/L   Chloride 105 98 - 111 mmol/L   CO2 18 (L) 22 - 32 mmol/L   Glucose, Bld 97 70 - 99 mg/dL   BUN 51 (H) 6 - 20 mg/dL   Creatinine, Ser 10.90 (H) 0.44 - 1.00 mg/dL   Calcium 8.6 (L) 8.9 - 10.3 mg/dL   GFR, Estimated 4 (L) >60 mL/min   Anion gap 17 (H) 5 - 15  Magnesium  Result Value Ref Range   Magnesium 1.9 1.7 - 2.4 mg/dL  Retic Panel  Result Value Ref Range   Retic Ct Pct 5.1 (H) 0.4 - 3.1 %   RBC. 2.75 (L) 3.87 - 5.11 MIL/uL   Retic Count, Absolute 140.5 19.0 - 186.0 K/uL   Immature Retic Fract 6.5 2.3 - 15.9 %   Reticulocyte Hemoglobin 28.7 >27.9 pg  Lactate dehydrogenase  Result Value Ref Range   LDH 354 (H) 98 - 192 U/L  CBC  Result Value Ref Range   WBC 7.6 4.0 - 10.5 K/uL   RBC 2.72 (L) 3.87 - 5.11 MIL/uL   Hemoglobin 8.6 (L) 12.0 - 15.0 g/dL   HCT 26.2 (L) 36.0 - 46.0 %   MCV 96.3 80.0 - 100.0 fL   MCH 31.6 26.0 - 34.0 pg   MCHC 32.8 30.0 - 36.0 g/dL   RDW 19.7 (H) 11.5 - 15.5 %   Platelets 248 150 - 400 K/uL   nRBC 0.0 0.0 - 0.2 %  Basic metabolic panel  Result Value Ref Range   Sodium 138 135 - 145 mmol/L   Potassium 3.1 (L) 3.5 - 5.1 mmol/L   Chloride 103 98 - 111 mmol/L   CO2 28 22 - 32 mmol/L   Glucose, Bld 98 70 - 99 mg/dL   BUN 26 (H) 6 - 20 mg/dL   Creatinine, Ser 6.53 (H) 0.44 - 1.00 mg/dL   Calcium 7.8 (L) 8.9 - 10.3 mg/dL   GFR, Estimated 7 (L) >60 mL/min   Anion gap 7 5 - 15  Magnesium  Result Value Ref Range   Magnesium 1.8 1.7 - 2.4 mg/dL  Retic Panel  Result Value Ref Range   Retic Ct Pct 4.2 (H) 0.4 - 3.1 %   RBC. 2.68 (L) 3.87 - 5.11 MIL/uL   Retic Count, Absolute 112.6 19.0 - 186.0 K/uL    Immature Retic Fract 5.6 2.3 - 15.9 %   Reticulocyte Hemoglobin 29.6 >27.9 pg  Lactate dehydrogenase  Result Value Ref Range   LDH 244 (H) 98 - 192 U/L  CBC  Result Value Ref Range   WBC 9.6 4.0 - 10.5 K/uL   RBC 2.67 (L) 3.87 - 5.11 MIL/uL   Hemoglobin 8.4 (L) 12.0 - 15.0 g/dL   HCT 26.3 (L) 36.0 - 46.0 %   MCV 98.5 80.0 - 100.0 fL   MCH 31.5 26.0 - 34.0 pg   MCHC 31.9 30.0 - 36.0 g/dL   RDW 19.2 (H) 11.5 - 15.5 %   Platelets 264  150 - 400 K/uL   nRBC 0.0 0.0 - 0.2 %  ECHOCARDIOGRAM COMPLETE  Result Value Ref Range   Weight 1,887.14 oz   Height 68 in   BP 135/87 mmHg   Ao pk vel 1.52 m/s   AV Area VTI 3.35 cm2   AR max vel 3.28 cm2   AV Mean grad 6.0 mmHg   AV Peak grad 9.2 mmHg   S' Lateral 3.00 cm   AV Area mean vel 2.86 cm2   Area-P 1/2 4.04 cm2   MV VTI 3.35 cm2  Type and screen [place]  Result Value Ref Range   ABO/RH(D) O POS    Antibody Screen NEG    Sample Expiration 10/29/2021,2359    Unit Number [id number]    Blood Component Type RED CELLS,LR    Unit division 00    Status of Unit ISSUED,FINAL    Transfusion Status OK TO TRANSFUSE    Crossmatch Result Compatible    Unit Number [id number]    Blood Component Type RED CELLS,LR    Unit division 00    Status of Unit REL FROM [facility]    Transfusion Status OK TO TRANSFUSE    Crossmatch Result      Compatible Performed at [facility], [address], [place], [place] [address]    Unit Number [id number]    Blood Component Type RED CELLS,LR    Unit division 00    Status of Unit ISSUED,FINAL    Transfusion Status OK TO TRANSFUSE    Crossmatch Result Compatible   Prepare RBC (crossmatch)  Result Value Ref Range   Order Confirmation      ORDER PROCESSED BY BLOOD BANK Performed at [facility], [address], [place], [place] [address]   ABO/Rh  Result Value Ref Range   ABO/RH(D)      O POS Performed at [facility], [address]  [place], [place] [address]   Prepare RBC (crossmatch)  Result Value Ref Range   Order Confirmation      DUPLICATE REQUEST BB SAMPLE OR UNITS ALREADY AVAILABLE Performed at [facility], [address] [place], [place] [address]   BPAM RBC  Result Value Ref Range   ISSUE DATE / TIME 563875643329    Blood Product Unit Number [id number]    PRODUCT CODE [id number]    Unit Type and Rh 5100    Blood Product Expiration Date 202212132359    Blood Product Unit Number [id number]    PRODUCT CODE [id number]    Unit Type and Rh 5100    Blood Product Expiration Date 202212132359    ISSUE DATE / TIME [id number]    Blood Product Unit Number [id number]    PRODUCT CODE [id number]    Unit Type and Rh 5100    Blood Product Expiration Date 202212132359   Troponin I (High Sensitivity)  Result Value Ref Range   Troponin I (High Sensitivity) 170 (HH) <18 ng/L  Troponin I (High Sensitivity)  Result Value Ref Range   Troponin I (High Sensitivity) 160 (HH) <18 ng/L  Troponin I (High Sensitivity)  Result Value Ref Range   Troponin I (High Sensitivity) 185 (HH) <18 ng/L  Troponin I (High Sensitivity)  Result Value Ref Range   Troponin I (High Sensitivity) 183 (HH) <18 ng/L       Assessment & Plan:   1. Encounter for completion of form with patient Forms brought in today - no job describption I cannot see any  nephrology records Reviewed hospitalization and whatever I could in chart - since I have not seen her in years and then only once in 2020, and not since, and then she did not complete labs or follow med advice reg BP. I do not see a recent functional assessment Nephrology records are not available - we have requested them. Unable to complete forms w/o job description - pt will not provide it - she says we need to call and get her job description.  I am happy to complete to the best of my abilities however the initial question states that I am familiar with the pt and her health hx -  and I am not  Also can refer for PT for functional assessment - since I do not know what physical limitations she has in general and since on PD.  2. ESRD on peritoneal dialysis [facility]) Requested records, no OV or labs found in care everywhere   forms put on old at [facility] desk while waiting for records.  Pt left the clinic saying "just forget it then, this is stressful" when I told her I cannot complete forms w/o knowing her job description and it is her responsibility to get that and bring it in, we cannot talk to her job for her - she needs to contact HR or her immediate supervisor.   Delsa Grana, PA-C 05/30/22 6:34 PM

## 2022-06-15 ENCOUNTER — Telehealth: Payer: Self-pay

## 2022-06-15 ENCOUNTER — Other Ambulatory Visit: Payer: Self-pay

## 2022-06-15 ENCOUNTER — Other Ambulatory Visit: Payer: Self-pay | Admitting: Family Medicine

## 2022-06-15 DIAGNOSIS — Z1211 Encounter for screening for malignant neoplasm of colon: Secondary | ICD-10-CM

## 2022-06-15 DIAGNOSIS — Z1231 Encounter for screening mammogram for malignant neoplasm of breast: Secondary | ICD-10-CM

## 2022-06-15 NOTE — Telephone Encounter (Signed)
Pt called in wanting to schedule colonoscopy. Didn't see active referral and last one done in 04/2016.advised pt I would send message to office and they will fu with her to get that scheduled.

## 2022-06-15 NOTE — Telephone Encounter (Signed)
Referral placed, pt notified. 

## 2022-06-16 ENCOUNTER — Telehealth: Payer: Self-pay

## 2022-06-16 DIAGNOSIS — Z1211 Encounter for screening for malignant neoplasm of colon: Secondary | ICD-10-CM

## 2022-06-16 NOTE — Telephone Encounter (Signed)
Patients chart has been reviewed during colonoscopy triage call.  Last colonoscopy was performed by Dr. Allen Norris 04/21/2016.  He noted to repeat colonoscopy in 10 years as no polyps were noted on colonoscopy report.  Patient does not have a family history of colon cancer.  Based on Dr. Dorothey Baseman 04/21/16 colonoscopy report informed patient she is not due for colonoscopy until 04/21/2026.  I will mail her a copy of her colonoscopy report for her records.  Thanks,  [place], [place]

## 2022-06-26 DIAGNOSIS — Z7682 Awaiting organ transplant status: Secondary | ICD-10-CM | POA: Insufficient documentation

## 2022-06-26 DIAGNOSIS — Z87891 Personal history of nicotine dependence: Secondary | ICD-10-CM | POA: Insufficient documentation

## 2022-07-18 ENCOUNTER — Telehealth: Payer: Self-pay

## 2022-07-18 NOTE — Telephone Encounter (Signed)
Just FYI.

## 2022-07-18 NOTE — Telephone Encounter (Signed)
I called pt to ask if she was going to bring her job description to be able to fill out her forms: -Physical ability assessment -[employer] Medical Assessment form Patient stated to "Forget about it". I will put these in the shredding container.

## 2022-07-25 ENCOUNTER — Ambulatory Visit
Admission: RE | Admit: 2022-07-25 | Discharge: 2022-07-25 | Disposition: A | Discharge status: Home / Self Care | Payer: 59 | Source: Ambulatory Visit | Attending: Family Medicine | Admitting: Family Medicine

## 2022-07-25 DIAGNOSIS — Z1231 Encounter for screening mammogram for malignant neoplasm of breast: Secondary | ICD-10-CM | POA: Insufficient documentation

## 2022-07-27 ENCOUNTER — Other Ambulatory Visit: Payer: Self-pay | Admitting: Family Medicine

## 2022-07-27 DIAGNOSIS — R928 Other abnormal and inconclusive findings on diagnostic imaging of breast: Secondary | ICD-10-CM

## 2022-08-24 ENCOUNTER — Ambulatory Visit
Admission: RE | Admit: 2022-08-24 | Discharge: 2022-08-24 | Disposition: A | Discharge status: Home / Self Care | Payer: 59 | Source: Ambulatory Visit | Attending: Family Medicine | Admitting: Family Medicine

## 2022-08-24 DIAGNOSIS — R928 Other abnormal and inconclusive findings on diagnostic imaging of breast: Secondary | ICD-10-CM | POA: Insufficient documentation

## 2022-08-25 ENCOUNTER — Other Ambulatory Visit: Payer: Self-pay | Admitting: Family Medicine

## 2022-08-25 DIAGNOSIS — N63 Unspecified lump in unspecified breast: Secondary | ICD-10-CM

## 2022-08-25 DIAGNOSIS — R928 Other abnormal and inconclusive findings on diagnostic imaging of breast: Secondary | ICD-10-CM

## 2022-09-07 ENCOUNTER — Ambulatory Visit
Admission: RE | Admit: 2022-09-07 | Discharge: 2022-09-07 | Disposition: A | Discharge status: Home / Self Care | Payer: 59 | Source: Ambulatory Visit | Attending: Family Medicine | Admitting: Family Medicine

## 2022-09-07 DIAGNOSIS — C50411 Malignant neoplasm of upper-outer quadrant of right female breast: Secondary | ICD-10-CM | POA: Diagnosis not present

## 2022-09-07 DIAGNOSIS — R928 Other abnormal and inconclusive findings on diagnostic imaging of breast: Secondary | ICD-10-CM | POA: Diagnosis present

## 2022-09-07 DIAGNOSIS — N63 Unspecified lump in unspecified breast: Secondary | ICD-10-CM

## 2022-09-07 DIAGNOSIS — N189 Chronic kidney disease, unspecified: Secondary | ICD-10-CM | POA: Diagnosis not present

## 2022-09-07 DIAGNOSIS — Z170 Estrogen receptor positive status [ER+]: Secondary | ICD-10-CM

## 2022-09-07 HISTORY — PX: BREAST BIOPSY: SHX20

## 2022-09-07 HISTORY — DX: Estrogen receptor positive status (ER+): Z17.0

## 2022-09-08 ENCOUNTER — Encounter: Payer: Self-pay | Admitting: *Deleted

## 2022-09-08 DIAGNOSIS — C50919 Malignant neoplasm of unspecified site of unspecified female breast: Secondary | ICD-10-CM

## 2022-09-08 LAB — SURGICAL PATHOLOGY

## 2022-09-08 NOTE — Progress Notes (Signed)
Received referral for newly diagnosed breast cancer from [facility] Radiology.  Navigation initiated.  Med Onc and Surgical consults scheduled.   She will see Dr. Bary Castilla on 10/3 at 4:00 and Dr. Janese Banks on 9/27 at 2:30.

## 2022-09-14 ENCOUNTER — Inpatient Hospital Stay: Payer: 59

## 2022-09-14 ENCOUNTER — Inpatient Hospital Stay: Payer: 59 | Attending: Oncology | Admitting: Oncology

## 2022-09-14 ENCOUNTER — Encounter: Payer: Self-pay | Admitting: Oncology

## 2022-09-14 ENCOUNTER — Encounter: Payer: Self-pay | Admitting: *Deleted

## 2022-09-14 VITALS — BP 136/100 | HR 100 | Resp 16 | Wt 119.9 lb

## 2022-09-14 DIAGNOSIS — F17210 Nicotine dependence, cigarettes, uncomplicated: Secondary | ICD-10-CM | POA: Diagnosis not present

## 2022-09-14 DIAGNOSIS — C50411 Malignant neoplasm of upper-outer quadrant of right female breast: Secondary | ICD-10-CM | POA: Diagnosis present

## 2022-09-14 DIAGNOSIS — Z992 Dependence on renal dialysis: Secondary | ICD-10-CM | POA: Insufficient documentation

## 2022-09-14 DIAGNOSIS — Z170 Estrogen receptor positive status [ER+]: Secondary | ICD-10-CM | POA: Diagnosis not present

## 2022-09-14 DIAGNOSIS — Z87891 Personal history of nicotine dependence: Secondary | ICD-10-CM | POA: Insufficient documentation

## 2022-09-14 DIAGNOSIS — Z7189 Other specified counseling: Secondary | ICD-10-CM | POA: Insufficient documentation

## 2022-09-14 NOTE — Progress Notes (Signed)
Hematology/Oncology Consult note [facility] Telephone:([phone] [phone]  Patient Care Team: [place] as PCP - General (Family Medicine) Daiva Huge, RN as Oncology Nurse Navigator   Name of the patient: Joy Ray  [id number]  Jul 27, 1966    Reason for referral-new diagnosis of breast cancer   Referring physician-Leisa Lucio Edward PA  Date of visit: 09/14/22   History of presenting illness-patient is a 57 year old female who underwent a bilateral screening mammogram in August 2023 which showed a possible distortion in the right breast.  This was followed by a diagnostic mammogram and ultrasound which showed a 3 x 3 x 3 mm irregular hypoechoic mass in the right breast.  No suspicious right axillary adenopathy.  This was biopsied and was consistent with a 5 mm invasive mammary carcinoma grade 1 ER greater than 90% positive PR greater than 90% positive and HER2 negative.  Patient is currently on home peritoneal dialysis 7 days a week.  Her mother was also diagnosed with breast cancer and had a negative genetic testing.  She was on progesterone injections up until 2 months ago.  Even while on injection she was not getting any menstrual cycles and has not had any return of menses despite stopping her injections she is G1, P1.  No prior abnormal breast biopsies.  ECOG PS- 1  Pain scale- 0   Review of systems- Review of Systems  Constitutional:  Positive for malaise/fatigue. Negative for chills, fever and weight loss.  HENT:  Negative for congestion, ear discharge and nosebleeds.   Eyes:  Negative for blurred vision.  Respiratory:  Negative for cough, hemoptysis, sputum production, shortness of breath and wheezing.   Cardiovascular:  Negative for chest pain, palpitations, orthopnea and claudication.  Gastrointestinal:  Negative for abdominal pain, blood in stool, constipation, diarrhea, heartburn, melena, nausea and vomiting.   Genitourinary:  Negative for dysuria, flank pain, frequency, hematuria and urgency.  Musculoskeletal:  Negative for back pain, joint pain and myalgias.  Skin:  Negative for rash.  Neurological:  Negative for dizziness, tingling, focal weakness, seizures, weakness and headaches.  Endo/Heme/Allergies:  Does not bruise/bleed easily.  Psychiatric/Behavioral:  Negative for depression and suicidal ideas. The patient does not have insomnia.     Allergies  Allergen Reactions   Ciprofloxacin     Pt denies    Patient Active Problem List   Diagnosis Date Noted   History of tobacco abuse 06/26/2022   Awaiting organ transplant status 06/26/2022   ESRD on peritoneal dialysis ([place]) 03/29/2022   Dysmenorrhea 03/29/2022   Protein-calorie malnutrition, severe 11/01/2021   Anemia due to folic acid deficiency    B12 deficiency    Thrombocytopenia (HCC)    Anemia in chronic kidney disease, on chronic dialysis ([place])    Acute respiratory failure with hypoxia ([employer]) 10/26/2021   History of smoking 10-25 pack years 04/18/2018   Allergic rhinitis 10/13/2015   H/O abnormal cervical Papanicolaou smear 10/13/2015   Hypertension goal BP (blood pressure) < 140/90 10/13/2015     Past Medical History:  Diagnosis Date   Acute kidney injury ([place])    Allergy    Cigarette smoker    HTN, goal below 140/90    Second degree hemorrhoids      Past Surgical History:  Procedure Laterality Date   BREAST BIOPSY Right 09/07/2022   u/s bx coil clip path pending   CAPD INSERTION N/A 11/02/2021   Procedure: LAPAROSCOPIC INSERTION CONTINUOUS AMBULATORY PERITONEAL DIALYSIS  (CAPD) CATHETER;  Surgeon:  Jules Husbands, MD;  Location: [facility] ORS;  Service: General;  Laterality: N/A;   COLONOSCOPY WITH PROPOFOL N/A 04/21/2016   Procedure: COLONOSCOPY WITH PROPOFOL;  Surgeon: Lucilla Lame, MD;  Location: [place];  Service: Endoscopy;  Laterality: N/A;   INSERTION OF MESH  11/02/2021   Procedure: INSERTION OF  MESH;  Surgeon: Jules Husbands, MD;  Location: [facility] ORS;  Service: General;;   TUBAL LIGATION  12/19/1992   After G1P1001    Social History   Socioeconomic History   Marital status: Single    Spouse name: Not on file   Number of children: 1   Years of education: Not on file   Highest education level: Not on file  Occupational History   Not on file  Tobacco Use   Smoking status: Light Smoker    Packs/day: 0.50    Years: 20.00    Total pack years: 10.00    Types: Cigarettes   Smokeless tobacco: Never   Tobacco comments:    pt has cut back to 1 or 2 cigs/day  Vaping Use   Vaping Use: Never used  Substance and Sexual Activity   Alcohol use: Not Currently    Comment:     Drug use: No   Sexual activity: Not Currently  Other Topics Concern   Not on file  Social History Narrative   Not on file   Social Determinants of Health   Financial Resource Strain: Low Risk  (03/28/2022)   Overall Financial Resource Strain (CARDIA)    Difficulty of Paying Living Expenses: Not hard at all  Food Insecurity: No Food Insecurity (03/28/2022)   Hunger Vital Sign    Worried About Running Out of Food in the Last Year: Never true    Ran Out of Food in the Last Year: Never true  Transportation Needs: No Transportation Needs (03/28/2022)   PRAPARE - Hydrologist (Medical): No    Lack of Transportation (Non-Medical): No  Physical Activity: Insufficiently Active (03/28/2022)   Exercise Vital Sign    Days of Exercise per Week: 3 days    Minutes of Exercise per Session: 20 min  Stress: No Stress Concern Present (03/28/2022)   [place]    Feeling of Stress : Only a little  Social Connections: Moderately Isolated (03/28/2022)   Social Connection and Isolation Panel [NHANES]    Frequency of Communication with Friends and Family: More than three times a week    Frequency of Social Gatherings with Friends and  Family: More than three times a week    Attends Religious Services: 1 to 4 times per year    Active Member of [employer] or Organizations: No    Attends Archivist Meetings: Never    Marital Status: Never married  Intimate Partner Violence: Not At Risk (03/28/2022)   Humiliation, Afraid, Rape, and Kick questionnaire    Fear of Current or Ex-Partner: No    Emotionally Abused: No    Physically Abused: No    Sexually Abused: No     Family History  Problem Relation Age of Onset   Cancer Mother        breast   Breast cancer Mother 72   Breast cancer Maternal Aunt      Current Outpatient Medications:    calcium acetate, Phos Binder, (PHOSLYRA) 667 MG/5ML SOLN, Take by mouth 3 (three) times daily with meals., Disp: , Rfl:    furosemide (  LASIX) 80 MG tablet, Take 80 mg by mouth daily., Disp: , Rfl:    multivitamin (RENA-VIT) TABS tablet, Take 1 tablet by mouth at bedtime. Can take any over-the-counter supplement., Disp: , Rfl: 0   potassium chloride SA (KLOR-CON M) 20 MEQ tablet, Take 20 mEq by mouth 2 (two) times daily., Disp: , Rfl:    vitamin B-12 1000 MCG tablet, Take 1 tablet (1,000 mcg total) by mouth daily. Can take any over-the-counter supplement., Disp: , Rfl:    fluticasone (FLONASE) 50 MCG/ACT nasal spray, SHAKE LIQUID AND USE 2 SPRAYS IN EACH NOSTRIL EVERY DAY (Patient not taking: Reported on 09/14/2022), Disp: 16 g, Rfl: 3   Physical exam:  Vitals:   09/14/22 1444  BP: (!) 136/100  Pulse: 100  Resp: 16  SpO2: 94%  Weight: 119 lb 14.4 oz (54.4 kg)   Physical Exam Cardiovascular:     Rate and Rhythm: Normal rate and regular rhythm.     Heart sounds: Normal heart sounds.  Pulmonary:     Effort: Pulmonary effort is normal.     Breath sounds: Normal breath sounds.  Abdominal:     General: Bowel sounds are normal.     Palpations: Abdomen is soft.  Skin:    General: Skin is warm and dry.  Neurological:     Mental Status: She is alert and oriented to person,  place, and time.   Breast exam: Bruising noted at the site of recent breast biopsy.  No palpable bilateral axillary adenopathy.       Latest Ref Rng & Units 11/02/2021    4:25 AM  CMP  Glucose 70 - 99 mg/dL 98   BUN 6 - 20 mg/dL 26   Creatinine 0.44 - 1.00 mg/dL 6.53   Sodium 135 - 145 mmol/L 138   Potassium 3.5 - 5.1 mmol/L 3.1   Chloride 98 - 111 mmol/L 103   CO2 22 - 32 mmol/L 28   Calcium 8.9 - 10.3 mg/dL 7.8       Latest Ref Rng & Units 11/02/2021    4:25 AM  CBC  WBC 4.0 - 10.5 K/uL 9.6   Hemoglobin 12.0 - 15.0 g/dL 8.4   Hematocrit 36.0 - 46.0 % 26.3   Platelets 150 - 400 K/uL 264     No images are attached to the encounter.  [place] RT BREAST BX W LOC DEV 1ST LESION IMG BX SPEC US GUIDE  Addendum Date: 09/08/2022   ADDENDUM REPORT: 09/08/2022 15:18 ADDENDUM: Pathology revealed GRADE I INVASIVE MAMMARY CARCINOMA, WITH TUBULAR FEATURES of the RIGHT breast, 9:30 o'clock, 4 CM FROM THE NIPPLE, (coil clip). This was found to be concordant by Dr. Valentino Saxon. Pathology results were discussed with the patient by telephone. The patient reported doing well after the biopsy with tenderness at the site. Post biopsy instructions and care were reviewed and questions were answered. The patient was encouraged to call [facility] for any additional concerns. Recommendation: Surgical and oncologic referral. Patient request for referral to [facility] was sent to Casper Harrison, RN Oncology Navigator at [facility] via [place] message on September 08, 2022. Pathology results reported by Terie Purser, RN on 09/08/2022. Electronically Signed   By: Valentino Saxon M.D.   On: 09/08/2022 15:18   Result Date: 09/08/2022 CLINICAL DATA:  RIGHT breast distortion EXAM: ULTRASOUND GUIDED RIGHT BREAST CORE NEEDLE BIOPSY COMPARISON:  Previous exam(s). PROCEDURE: I met with the patient  and we discussed the  procedure of ultrasound-guided biopsy, including benefits and alternatives. We discussed the high likelihood of a successful procedure. We discussed the risks of the procedure, including infection, bleeding, tissue injury, clip migration, and inadequate sampling. Informed written consent was given. The usual time-out protocol was performed immediately prior to the procedure. Lesion quadrant: Upper outer quadrant Using sterile technique and 1% lidocaine and 1% lidocaine with epinephrine as local anesthetic, under direct ultrasound visualization, a 14 gauge spring-loaded device was used to perform biopsy of a mass at 9:30 using a lateral approach. At the conclusion of the procedure a COIL shaped tissue marker clip was deployed into the biopsy cavity. Follow up 2 view mammogram was performed and dictated separately. IMPRESSION: Ultrasound guided biopsy of a mass at 9:30. No apparent complications. Electronically Signed: By: Valentino Saxon M.D. On: 09/07/2022 13:42   MM CLIP PLACEMENT RIGHT  Result Date: 09/07/2022 CLINICAL DATA:  Status post ultrasound-guided biopsy EXAM: 3D DIAGNOSTIC RIGHT MAMMOGRAM POST ULTRASOUND BIOPSY COMPARISON:  Previous exam(s). FINDINGS: 3D Mammographic images were obtained following ultrasound guided biopsy of a mass at 930 o'clock. The COIL biopsy marking clip is in expected position at the site of biopsy. Clip falls at the site of distortion described prior diagnostic workup, best seen on today's true lateral imaging IMPRESSION: Appropriate positioning of the COIL shaped biopsy marking clip at the site of biopsy in the RIGHT upper outer breast at posterior depth. Final Assessment: Post Procedure Mammograms for Marker Placement Electronically Signed   By: Valentino Saxon M.D.   On: 09/07/2022 13:41  MM DIAG BREAST TOMO UNI RIGHT  Result Date: 08/24/2022 CLINICAL DATA:  56 year old female recalled from screening mammogram dated 07/25/2022 for possible right breast distortion.  EXAM: DIGITAL DIAGNOSTIC UNILATERAL RIGHT MAMMOGRAM WITH TOMOSYNTHESIS; ULTRASOUND RIGHT BREAST LIMITED TECHNIQUE: Right digital diagnostic mammography and breast tomosynthesis was performed.; Targeted ultrasound examination of the right breast was performed COMPARISON:  Previous exam(s). ACR Breast Density Category c: The breast tissue is heterogeneously dense, which may obscure small masses. FINDINGS: There is a small focus of persistent distortion in the slightly upper right breast at posterior depth, best seen on the MLO projection. It localizes laterally on tomosynthesis. Further evaluation with ultrasound was performed. Targeted ultrasound is performed, showing an irregular hypoechoic mass at the deep 9:30 position 4 cm from the nipple. It measures 3 x 3 x 3 mm. This correlates with the mammographically identified distortion. Evaluation of the right axilla demonstrates no suspicious lymphadenopathy. IMPRESSION: 1. Suspicious right breast mass corresponding with the mammographically identified distortion. Recommend ultrasound-guided biopsy. 2. No suspicious right axillary lymphadenopathy. RECOMMENDATION: Ultrasound-guided biopsy of the right breast. I have discussed the findings and recommendations with the patient. If applicable, a reminder letter will be sent to the patient regarding the next appointment. BI-RADS CATEGORY  4: Suspicious. Electronically Signed   By: Kristopher Oppenheim M.D.   On: 08/24/2022 15:17  US BREAST LTD UNI RIGHT INC AXILLA  Result Date: 08/24/2022 CLINICAL DATA:  56 year old female recalled from screening mammogram dated 07/25/2022 for possible right breast distortion. EXAM: DIGITAL DIAGNOSTIC UNILATERAL RIGHT MAMMOGRAM WITH TOMOSYNTHESIS; ULTRASOUND RIGHT BREAST LIMITED TECHNIQUE: Right digital diagnostic mammography and breast tomosynthesis was performed.; Targeted ultrasound examination of the right breast was performed COMPARISON:  Previous exam(s). ACR Breast Density Category c:  The breast tissue is heterogeneously dense, which may obscure small masses. FINDINGS: There is a small focus of persistent distortion in the slightly upper right breast at posterior depth, best seen on  the MLO projection. It localizes laterally on tomosynthesis. Further evaluation with ultrasound was performed. Targeted ultrasound is performed, showing an irregular hypoechoic mass at the deep 9:30 position 4 cm from the nipple. It measures 3 x 3 x 3 mm. This correlates with the mammographically identified distortion. Evaluation of the right axilla demonstrates no suspicious lymphadenopathy. IMPRESSION: 1. Suspicious right breast mass corresponding with the mammographically identified distortion. Recommend ultrasound-guided biopsy. 2. No suspicious right axillary lymphadenopathy. RECOMMENDATION: Ultrasound-guided biopsy of the right breast. I have discussed the findings and recommendations with the patient. If applicable, a reminder letter will be sent to the patient regarding the next appointment. BI-RADS CATEGORY  4: Suspicious. Electronically Signed   By: Kristopher Oppenheim M.D.   On: 08/24/2022 15:17   Assessment and plan- Patient is a 56 y.o. female with newly diagnosed clinical prognostic stage Ia invasive mammary carcinoma of the right breast cT1 aN0 M0 ER/PR positive HER2 negative here to discuss further management  Discussed results of mammogram and ultrasound with the patient in detail.  Mammogram showed a 3 mm right breast mass at the 9:30 position.  No evidence of right axillary adenopathy.  Core biopsy shows a 5 mm grade 1 invasive mammary carcinoma ER/PR more than 90% positive and HER2 negative.  I recommend upfront lumpectomy for her with a sentinel lymph node biopsy followed by consideration for adjuvant radiation therapy.  Patient is concerned that her ongoing peritoneal dialysis is already affecting her quality of life and is not keen on pursuing adjuvant radiation therapy if it comes to it.  I  have encouraged her to discuss this with Dr. Bary Castilla .  If she decides not to pursue adjuvant radiation therapy consideration may need to be given for right mastectomy.  With a small grade 1 tumor I do not think that she would benefit from adjuvant chemotherapy but that could be decided based on final pathology results of Oncotype testing needs to be sent or not.  Patient herself does not wish to go through chemotherapy even if that were to be an option.  Given that she has an ER positive tumor there would be role for endocrine therapy for 5 years which I will discuss with her in greater detail when I see her in about 3 to 4 weeks from now after final pathology results are back.  Treatment will be given with a curative intent.  I will also refer her for genetic counseling   Cancer Staging  Malignant neoplasm of upper-outer quadrant of right breast in female, estrogen receptor positive ([place]) Staging form: Breast, AJCC 8th Edition - Clinical stage from 09/14/2022: Stage IA (cT1a, cN0, cM0, G1, ER+, PR+, HER2-) - Signed by Sindy Guadeloupe, MD on 09/14/2022 Histologic grading system: 3 grade system     Thank you for this kind referral and the opportunity to participate in the care of this patient   Visit Diagnosis 1. Malignant neoplasm of upper-outer quadrant of right breast in female, estrogen receptor positive ([place])   2. Goals of care, counseling/discussion     Dr. Randa Evens, MD, MPH [facility] at [facility] [phone] 09/14/2022

## 2022-09-14 NOTE — Progress Notes (Signed)
Met with patient at her initial medical oncology visit.     Reviewed Breast Cancer treatment handbook.   Care plan summary given to patient.   Reviewed outreach programs and cancer center services.

## 2022-09-20 ENCOUNTER — Inpatient Hospital Stay: Payer: 59 | Attending: Oncology | Admitting: Licensed Clinical Social Worker

## 2022-09-20 ENCOUNTER — Inpatient Hospital Stay: Payer: 59

## 2022-09-20 ENCOUNTER — Encounter: Payer: Self-pay | Admitting: Licensed Clinical Social Worker

## 2022-09-20 DIAGNOSIS — Z803 Family history of malignant neoplasm of breast: Secondary | ICD-10-CM | POA: Diagnosis not present

## 2022-09-20 DIAGNOSIS — Z170 Estrogen receptor positive status [ER+]: Secondary | ICD-10-CM | POA: Diagnosis not present

## 2022-09-20 DIAGNOSIS — C50411 Malignant neoplasm of upper-outer quadrant of right female breast: Secondary | ICD-10-CM | POA: Diagnosis not present

## 2022-09-20 NOTE — Progress Notes (Signed)
REFERRING PROVIDER: Sindy Guadeloupe, MD [place],   [id number]  PRIMARY PROVIDER:  [place]  PRIMARY REASON FOR VISIT:  1. Malignant neoplasm of upper-outer quadrant of right breast in female, estrogen receptor positive ([place])   2. Family history of breast cancer      HISTORY OF PRESENT ILLNESS:   Joy Ray, a 56 y.o. female, was seen for a [place] cancer genetics consultation at the request of Dr. Janese Banks due to a personal and family history of breast cancer.  Joy Ray presents to clinic today to discuss the possibility of a hereditary predisposition to cancer, genetic testing, and to further clarify her future cancer risks, as well as potential cancer risks for family members.   CANCER HISTORY:  Oncology History  Malignant neoplasm of upper-outer quadrant of right breast in female, estrogen receptor positive ([place])  09/14/2022 Initial Diagnosis   Malignant neoplasm of upper-outer quadrant of right breast in female, estrogen receptor positive ([place])   09/14/2022 Cancer Staging   Staging form: Breast, AJCC 8th Edition - Clinical stage from 09/14/2022: Stage IA (cT1a, cN0, cM0, G1, ER+, PR+, HER2-) - Signed by Sindy Guadeloupe, MD on 09/14/2022 Histologic grading system: 3 grade system     In 2023, at the age of 35, Joy Ray was diagnosed with invasive mammary carcinoma of the right breast, ER/PR+, HER2-. The treatment plan includes surgery which has not been scheduled yet, patient is leaning towards mastectomy.   RISK FACTORS:  Menarche was at age 77.  First live birth at age 60.  Ovaries intact: yes.  Hysterectomy: no.  Menopausal status: perimenopausal.  HRT use: 0 years. Colonoscopy: yes; normal.  Past Medical History:  Diagnosis Date   Acute kidney injury ([place])    Allergy    Cigarette smoker    HTN, goal below 140/90    Malignant neoplasm of upper-outer quadrant of right breast in female, estrogen receptor positive ([place])  09/14/2022   Second degree hemorrhoids     Past Surgical History:  Procedure Laterality Date   BREAST BIOPSY Right 09/07/2022   u/s bx coil clip path pending   CAPD INSERTION N/A 11/02/2021   Procedure: LAPAROSCOPIC INSERTION CONTINUOUS AMBULATORY PERITONEAL DIALYSIS  (CAPD) CATHETER;  Surgeon: Jules Husbands, MD;  Location: [facility];  Service: General;  Laterality: N/A;   COLONOSCOPY WITH PROPOFOL N/A 04/21/2016   Procedure: COLONOSCOPY WITH PROPOFOL;  Surgeon: Lucilla Lame, MD;  Location: [place];  Service: Endoscopy;  Laterality: N/A;   INSERTION OF MESH  11/02/2021   Procedure: INSERTION OF MESH;  Surgeon: Jules Husbands, MD;  Location: [facility] OR[facility];  Service: General;;   TUBAL LIGATION  12/19/1992   After G1P1001    FAMILY HISTORY:  We obtained a detailed, 4-generation family history.  Significant diagnoses are listed below: Family History  Problem Relation Age of Onset   Cancer Mother        breast   Breast cancer Mother 70   Breast cancer Maternal Aunt    Ms. Callens has 1 son, 42. She has 1 brother, 1 sister, no cancers.  Ms. Hennick father passed at 60. No known cancers on his side of the family.  Ms. Wingard mother had breast cancer at 50 and pancreatic cancer, she had negative genetic testing through the [facility] Multi-Cancer panel in 2021 (ordered through Dr. Mike Gip). Two maternal aunts had breast cancer, unknown ages of diagnosis. No other known cancers on this side of the family.  Ms. Adelstein is aware of previous family history of genetic testing for hereditary cancer risks. There is no reported Ashkenazi Jewish ancestry. There is no known consanguinity.    GENETIC COUNSELING ASSESSMENT: Joy Ray is a 56 y.o. female with a personal and family history of breast cancer which is somewhat suggestive of a hereditary cancer syndrome and predisposition to cancer. We, therefore, discussed and recommended the following at today's visit.   DISCUSSION: We  discussed that approximately 10% of breast cancer is hereditary. Most cases of hereditary breast cancer are associated with BRCA1/BRCA2 genes, although there are other genes associated with hereditary cancer as well. Cancers and risks are gene specific. While her mother's negative genetic test result is very reassuring, it's possible that our technology now (RNA) could pick up on something that her mother's test missed in 2021. We discussed that testing is beneficial for several reasons including knowing about cancer risks, identifying potential screening and risk-reduction options that may be appropriate, and to understand if other family members could be at risk for cancer and allow them to undergo genetic testing.   We reviewed the characteristics, features and inheritance patterns of hereditary cancer syndromes. We also discussed genetic testing, including the appropriate family members to test, the process of testing, insurance coverage and turn-around-time for results. We discussed the implications of a negative, positive and/or variant of uncertain significant result. We recommended [name] pursue genetic testing for the [facility] Multi-Cancer+RNA gene panel.   Based on [name]'s personal and family history of cancer, she meets medical criteria for genetic testing. Despite that she meets criteria, she may still have an out of pocket cost. We discussed that if her out of pocket cost for testing is over $100, the laboratory will call and confirm whether she wants to proceed with testing.  If the out of pocket cost of testing is less than $100 she will be billed by the genetic testing laboratory.   PLAN: After considering the risks, benefits, and limitations,  [name] did not wish to pursue genetic testing at today's visit. We understand this decision and remain available to coordinate genetic testing at any time in the future. We, therefore, recommend [name] continue to follow the  cancer screening guidelines given by her primary healthcare provider.  [name] questions were answered to her satisfaction today. Our contact information was provided should additional questions or concerns arise. Thank you for the referral and allowing [place] to share in the care of your patient.   Faith Rogue, MS, [facility] Genetic Counselor [email or web address] Phone: [phone]  The patient was seen for a total of 20 minutes in face-to-face genetic counseling.  Dr. Grayland Ormond was available for discussion regarding this case.   _______________________________________________________________________ For Office Staff:  Number of people involved in session: 1 Was an Intern/ student involved with case: no

## 2022-09-21 ENCOUNTER — Other Ambulatory Visit: Payer: Self-pay | Admitting: General Surgery

## 2022-09-21 ENCOUNTER — Inpatient Hospital Stay (HOSPITAL_BASED_OUTPATIENT_CLINIC_OR_DEPARTMENT_OTHER): Payer: 59 | Admitting: Hospice and Palliative Medicine

## 2022-09-21 DIAGNOSIS — Z170 Estrogen receptor positive status [ER+]: Secondary | ICD-10-CM

## 2022-09-21 DIAGNOSIS — C50411 Malignant neoplasm of upper-outer quadrant of right female breast: Secondary | ICD-10-CM
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
## 2022-09-21 NOTE — Progress Notes (Signed)
Multidisciplinary Oncology Council Documentation  Joy Ray was presented by our [facility] on 09/21/2022, which included representatives from:  Palliative Care Dietitian  Physical/Occupational Therapist Nurse Navigator Genetics Speech Therapist Social work Survivorship RN IT sales professional   Joy Ray currently presents with history of breast cancer  We reviewed previous medical and familial history, history of present illness, and recent lab results along with all available histopathologic and imaging studies. The [facility] considered available treatment options and made the following recommendations/referrals:  Rehab screening  The MOC is a meeting of clinicians from various specialty areas who evaluate and discuss patients for whom a multidisciplinary approach is being considered. Final determinations in the plan of care are those of the provider(s).   Today's extended care, comprehensive team conference, Joy Ray was not present for the discussion and was not examined.

## 2022-09-22 ENCOUNTER — Encounter: Payer: Self-pay | Admitting: *Deleted

## 2022-09-22 NOTE — Progress Notes (Signed)
Mastectomy scheduled for 10/23.   Dr. Elroy Channel appt. Has been rescheduled to 11/6.  Appt. Details given to Ms. Devol.

## 2022-09-27 ENCOUNTER — Ambulatory Visit: Payer: 59 | Admitting: Family Medicine

## 2022-10-03 ENCOUNTER — Encounter: Payer: Self-pay | Admitting: *Deleted

## 2022-10-03 ENCOUNTER — Encounter
Admission: RE | Admit: 2022-10-03 | Discharge: 2022-10-03 | Disposition: A | Discharge status: Home / Self Care | Payer: 59 | Source: Ambulatory Visit | Attending: General Surgery | Admitting: General Surgery

## 2022-10-03 DIAGNOSIS — D631 Anemia in chronic kidney disease: Secondary | ICD-10-CM

## 2022-10-03 DIAGNOSIS — N186 End stage renal disease: Secondary | ICD-10-CM

## 2022-10-03 NOTE — Patient Instructions (Addendum)
Your procedure is scheduled on: Monday October 10, 2022. Report to [facility] radiology desk at 9:30 am, before going to Day Surgery on 2nd floor.      Remember: Instructions that are not followed completely may result in serious medical risk,  up to and including death, or upon the discretion of your surgeon and anesthesiologist your  surgery may need to be rescheduled.     _X__ 1. Do not eat food after midnight the night before your procedure.                 No chewing gum or hard candies. You may drink clear liquids up to 2 hours                 before you are scheduled to arrive for your surgery- DO not drink clear                 liquids within 2 hours of the start of your surgery.                 Clear Liquids include:  water.   __X__2.  On the morning of surgery brush your teeth with toothpaste and water, you                may rinse your mouth with mouthwash if you wish.  Do not swallow any toothpaste or mouthwash.     _X__ 3.  No Alcohol for 24 hours before or after surgery.   _X__ 4.  Do Not Smoke or use e-cigarettes For 24 Hours Prior to Your Surgery.                 Do not use any chewable tobacco products for at least 6 hours prior to                 Surgery.  _X__  5.  Do not use any recreational drugs (marijuana, cocaine, heroin, ecstasy, MDMA or other)                For at least one week prior to your surgery.  Combination of these drugs with anesthesia                May have life threatening results.  ____  6.  Bring all medications with you on the day of surgery if instructed.   __X__7.  Notify your doctor if there is any change in your medical condition      (cold, fever, infections).     Do not wear jewelry, make-up, hairpins, clips or nail polish. Do not wear lotions, powders, or perfumes or You may wear deodorant. Do not shave 48 hours prior to surgery. Men may shave face and neck. Do not bring valuables to the hospital.    [facility] is not responsible for any belongings or valuables.  Contacts, dentures or bridgework may not be worn into surgery. Leave your suitcase in the car. After surgery it may be brought to your room. For patients admitted to the hospital, discharge time is determined by your treatment team.   Patients discharged the day of surgery will not be allowed to drive home.   Make arrangements for someone to be with you for the first 24 hours of your Same Day Discharge.   __X__ Take these medicines the morning of surgery with A SIP OF WATER:    1. None   2.   3.   4.  5.  6.  ____ Fleet Enema (as directed)   __X__ Use CHG Soap (or wipes) as directed  ____ Use Benzoyl Peroxide Gel as instructed  ____ Use inhalers on the day of surgery  ____ Stop metformin 2 days prior to surgery    ____ Take 1/2 of usual insulin dose the night before surgery. No insulin the morning          of surgery.   ____ Call your PCP, cardiologist, or Pulmonologist if taking Coumadin/Plavix/aspirin and ask when to stop before your surgery.   __X__ One Week prior to surgery- Stop Anti-inflammatories such as Ibuprofen, Aleve, Advil, Motrin, meloxicam (MOBIC), diclofenac, etodolac, ketorolac, Toradol, Daypro, piroxicam, Goody's or BC powders. OK TO USE TYLENOL IF NEEDED   __X__ Stop supplements until after surgery.    ____ Bring C-Pap to the hospital.    If you have any questions regarding your pre-procedure instructions,  Please call Pre-admit Testing at [phone]

## 2022-10-06 ENCOUNTER — Encounter
Admission: RE | Admit: 2022-10-06 | Discharge: 2022-10-06 | Disposition: A | Discharge status: Home / Self Care | Payer: 59 | Source: Ambulatory Visit | Attending: General Surgery | Admitting: General Surgery

## 2022-10-06 ENCOUNTER — Encounter: Payer: Self-pay | Admitting: Urgent Care

## 2022-10-06 DIAGNOSIS — D631 Anemia in chronic kidney disease: Secondary | ICD-10-CM | POA: Diagnosis not present

## 2022-10-06 DIAGNOSIS — Z01812 Encounter for preprocedural laboratory examination: Secondary | ICD-10-CM | POA: Insufficient documentation

## 2022-10-06 DIAGNOSIS — Z992 Dependence on renal dialysis: Secondary | ICD-10-CM | POA: Insufficient documentation

## 2022-10-06 DIAGNOSIS — N186 End stage renal disease: Secondary | ICD-10-CM | POA: Insufficient documentation

## 2022-10-06 LAB — BASIC METABOLIC PANEL
Anion gap: 8 (ref 5–15)
BUN: 38 mg/dL — ABNORMAL HIGH (ref 6–20)
CO2: 25 mmol/L (ref 22–32)
Calcium: 8.7 mg/dL — ABNORMAL LOW (ref 8.9–10.3)
Chloride: 100 mmol/L (ref 98–111)
Creatinine, Ser: 4.92 mg/dL — ABNORMAL HIGH (ref 0.44–1.00)
GFR, Estimated: 10 mL/min — ABNORMAL LOW (ref >60)
Glucose, Bld: 85 mg/dL (ref 70–99)
Potassium: 3.6 mmol/L (ref 3.5–5.1)
Sodium: 133 mmol/L — ABNORMAL LOW (ref 135–145)

## 2022-10-06 LAB — CBC
HCT: 42.8 % (ref 36.0–46.0)
Hemoglobin: 13.7 g/dL (ref 12.0–15.0)
MCH: 29.3 pg (ref 26.0–34.0)
MCHC: 32 g/dL (ref 30.0–36.0)
MCV: 91.6 fL (ref 80.0–100.0)
Platelets: 308 10*3/uL (ref 150–400)
RBC: 4.67 MIL/uL (ref 3.87–5.11)
RDW: 14.6 % (ref 11.5–15.5)
WBC: 7.7 10*3/uL (ref 4.0–10.5)
nRBC: 0 % (ref 0.0–0.2)

## 2022-10-10 ENCOUNTER — Ambulatory Visit: Payer: 59 | Admitting: Anesthesiology

## 2022-10-10 ENCOUNTER — Other Ambulatory Visit: Payer: Self-pay

## 2022-10-10 ENCOUNTER — Ambulatory Visit
Admission: RE | Admit: 2022-10-10 | Discharge: 2022-10-10 | Disposition: A | Discharge status: Home / Self Care | Payer: 59 | Source: Ambulatory Visit | Attending: General Surgery | Admitting: General Surgery

## 2022-10-10 ENCOUNTER — Ambulatory Visit: Payer: 59 | Admitting: Urgent Care

## 2022-10-10 ENCOUNTER — Encounter
Admission: RE | Disposition: A | Discharge status: Home / Self Care | Payer: Self-pay | Source: Home / Self Care | Attending: General Surgery

## 2022-10-10 ENCOUNTER — Encounter: Payer: Self-pay | Admitting: General Surgery

## 2022-10-10 ENCOUNTER — Ambulatory Visit
Admission: RE | Admit: 2022-10-10 | Discharge: 2022-10-10 | Disposition: A | Discharge status: Home / Self Care | Payer: 59 | Attending: General Surgery | Admitting: General Surgery

## 2022-10-10 DIAGNOSIS — C50411 Malignant neoplasm of upper-outer quadrant of right female breast: Secondary | ICD-10-CM

## 2022-10-10 DIAGNOSIS — N186 End stage renal disease: Secondary | ICD-10-CM

## 2022-10-10 DIAGNOSIS — C50911 Malignant neoplasm of unspecified site of right female breast: Secondary | ICD-10-CM | POA: Diagnosis present

## 2022-10-10 DIAGNOSIS — I10 Essential (primary) hypertension: Secondary | ICD-10-CM | POA: Insufficient documentation

## 2022-10-10 HISTORY — PX: SIMPLE MASTECTOMY WITH AXILLARY SENTINEL NODE BIOPSY: SHX6098

## 2022-10-10 LAB — POCT I-STAT, CHEM 8
BUN: 35 mg/dL — ABNORMAL HIGH (ref 6–20)
Calcium, Ion: 1.13 mmol/L — ABNORMAL LOW (ref 1.15–1.40)
Chloride: 104 mmol/L (ref 98–111)
Creatinine, Ser: 6.9 mg/dL — ABNORMAL HIGH (ref 0.44–1.00)
Glucose, Bld: 81 mg/dL (ref 70–99)
HCT: 41 % (ref 36.0–46.0)
Hemoglobin: 13.9 g/dL (ref 12.0–15.0)
Potassium: 4.2 mmol/L (ref 3.5–5.1)
Sodium: 135 mmol/L (ref 135–145)
TCO2: 23 mmol/L (ref 22–32)

## 2022-10-10 SURGERY — SIMPLE MASTECTOMY WITH AXILLARY SENTINEL NODE BIOPSY
Anesthesia: General | Site: Breast | Laterality: Right

## 2022-10-10 MED ORDER — ONDANSETRON HCL 4 MG/2ML IJ SOLN
INTRAMUSCULAR | Status: DC | PRN
  Administered 2022-10-10: 4 mg via INTRAVENOUS

## 2022-10-10 MED ORDER — SODIUM CHLORIDE 0.9 % IV SOLN: INTRAVENOUS | Status: DC

## 2022-10-10 MED ORDER — METHYLENE BLUE 1 % INJ SOLN
INTRAVENOUS | Status: DC | PRN
  Administered 2022-10-10: 5 mL via SUBMUCOSAL

## 2022-10-10 MED ORDER — FENTANYL CITRATE (PF) 100 MCG/2ML IJ SOLN
25.0000 ug | INTRAMUSCULAR | Status: DC | PRN
  Administered 2022-10-10 (×3): 25 ug via INTRAVENOUS

## 2022-10-10 MED ORDER — HYDROCODONE-ACETAMINOPHEN 5-325 MG PO TABS: ORAL_TABLET | ORAL | 0 refills | Status: DC

## 2022-10-10 MED ORDER — FENTANYL CITRATE (PF) 100 MCG/2ML IJ SOLN
INTRAMUSCULAR | Status: AC
  Filled 2022-10-10: qty 2

## 2022-10-10 MED ORDER — TECHNETIUM TC 99M TILMANOCEPT KIT
1.0200 | PACK | Freq: Once | INTRAVENOUS | Status: AC | PRN
  Administered 2022-10-10: 1.02 via INTRADERMAL

## 2022-10-10 MED ORDER — KETOROLAC TROMETHAMINE 30 MG/ML IJ SOLN
INTRAMUSCULAR | Status: DC | PRN
  Administered 2022-10-10: 15 mg via INTRAVENOUS

## 2022-10-10 MED ORDER — CEFAZOLIN SODIUM-DEXTROSE 2-4 GM/100ML-% IV SOLN
2.0000 g | INTRAVENOUS | Status: AC
  Administered 2022-10-10: 2 g via INTRAVENOUS

## 2022-10-10 MED ORDER — FAMOTIDINE 20 MG PO TABS
ORAL_TABLET | ORAL | Status: AC
  Administered 2022-10-10: 20 mg via ORAL
  Filled 2022-10-10: qty 1

## 2022-10-10 MED ORDER — PROMETHAZINE HCL 25 MG/ML IJ SOLN: 6.2500 mg | INTRAMUSCULAR | Status: DC | PRN

## 2022-10-10 MED ORDER — CHLORHEXIDINE GLUCONATE CLOTH 2 % EX PADS: 6.0000 | MEDICATED_PAD | Freq: Once | CUTANEOUS | Status: DC

## 2022-10-10 MED ORDER — KETOROLAC TROMETHAMINE 30 MG/ML IJ SOLN
INTRAMUSCULAR | Status: AC
  Filled 2022-10-10: qty 1

## 2022-10-10 MED ORDER — PHENYLEPHRINE HCL (PRESSORS) 10 MG/ML IV SOLN
INTRAVENOUS | Status: DC | PRN
  Administered 2022-10-10: 80 ug via INTRAVENOUS

## 2022-10-10 MED ORDER — MIDAZOLAM HCL 2 MG/2ML IJ SOLN
INTRAMUSCULAR | Status: DC | PRN
  Administered 2022-10-10: 2 mg via INTRAVENOUS

## 2022-10-10 MED ORDER — DEXAMETHASONE SODIUM PHOSPHATE 10 MG/ML IJ SOLN
INTRAMUSCULAR | Status: DC | PRN
  Administered 2022-10-10: 5 mg via INTRAVENOUS

## 2022-10-10 MED ORDER — ONDANSETRON HCL 4 MG/2ML IJ SOLN
INTRAMUSCULAR | Status: AC
  Filled 2022-10-10: qty 2

## 2022-10-10 MED ORDER — METHYLENE BLUE 1 % INJ SOLN
INTRAVENOUS | Status: AC
  Filled 2022-10-10: qty 10

## 2022-10-10 MED ORDER — CHLORHEXIDINE GLUCONATE 0.12 % MT SOLN: 15.0000 mL | Freq: Once | OROMUCOSAL | Status: AC

## 2022-10-10 MED ORDER — CEFAZOLIN SODIUM-DEXTROSE 2-4 GM/100ML-% IV SOLN
INTRAVENOUS | Status: AC
  Filled 2022-10-10: qty 100

## 2022-10-10 MED ORDER — LIDOCAINE HCL (PF) 2 % IJ SOLN
INTRAMUSCULAR | Status: AC
  Filled 2022-10-10: qty 5

## 2022-10-10 MED ORDER — ACETAMINOPHEN 10 MG/ML IV SOLN
INTRAVENOUS | Status: AC
  Filled 2022-10-10: qty 100

## 2022-10-10 MED ORDER — PROPOFOL 10 MG/ML IV BOLUS
INTRAVENOUS | Status: DC | PRN
  Administered 2022-10-10: 200 mg via INTRAVENOUS
  Administered 2022-10-10: 50 mg via INTRAVENOUS

## 2022-10-10 MED ORDER — FAMOTIDINE 20 MG PO TABS: 20.0000 mg | ORAL_TABLET | Freq: Once | ORAL | Status: AC

## 2022-10-10 MED ORDER — MIDAZOLAM HCL 2 MG/2ML IJ SOLN
INTRAMUSCULAR | Status: AC
  Filled 2022-10-10: qty 2

## 2022-10-10 MED ORDER — ORAL CARE MOUTH RINSE: 15.0000 mL | Freq: Once | OROMUCOSAL | Status: AC

## 2022-10-10 MED ORDER — PROPOFOL 10 MG/ML IV BOLUS
INTRAVENOUS | Status: AC
  Filled 2022-10-10: qty 20

## 2022-10-10 MED ORDER — FENTANYL CITRATE (PF) 100 MCG/2ML IJ SOLN
INTRAMUSCULAR | Status: AC
  Administered 2022-10-10: 25 ug via INTRAVENOUS
  Filled 2022-10-10: qty 2

## 2022-10-10 MED ORDER — DEXAMETHASONE SODIUM PHOSPHATE 10 MG/ML IJ SOLN
INTRAMUSCULAR | Status: AC
  Filled 2022-10-10: qty 1

## 2022-10-10 MED ORDER — CHLORHEXIDINE GLUCONATE CLOTH 2 % EX PADS
6.0000 | MEDICATED_PAD | Freq: Once | CUTANEOUS | Status: AC
  Administered 2022-10-10: 6 via TOPICAL

## 2022-10-10 MED ORDER — ACETAMINOPHEN 10 MG/ML IV SOLN
INTRAVENOUS | Status: DC | PRN
  Administered 2022-10-10: 1000 mg via INTRAVENOUS

## 2022-10-10 MED ORDER — FENTANYL CITRATE (PF) 100 MCG/2ML IJ SOLN
INTRAMUSCULAR | Status: DC | PRN
  Administered 2022-10-10: 25 ug via INTRAVENOUS
  Administered 2022-10-10 (×2): 50 ug via INTRAVENOUS
  Administered 2022-10-10 (×2): 25 ug via INTRAVENOUS

## 2022-10-10 MED ORDER — STERILE WATER FOR IRRIGATION IR SOLN
Status: DC | PRN
  Administered 2022-10-10: 500 mL

## 2022-10-10 MED ORDER — CHLORHEXIDINE GLUCONATE 0.12 % MT SOLN
OROMUCOSAL | Status: AC
  Administered 2022-10-10: 15 mL via OROMUCOSAL
  Filled 2022-10-10: qty 15

## 2022-10-10 MED ORDER — LIDOCAINE HCL (CARDIAC) PF 100 MG/5ML IV SOSY
PREFILLED_SYRINGE | INTRAVENOUS | Status: DC | PRN
  Administered 2022-10-10: 60 mg via INTRAVENOUS

## 2022-10-10 SURGICAL SUPPLY — 55 items
APL PRP STRL LF DISP 70% ISPRP (MISCELLANEOUS) ×1
APPLIER CLIP 11 MED OPEN (CLIP)
APPLIER CLIP 13 LRG OPEN (CLIP)
APR CLP LRG 13 20 CLIP (CLIP)
APR CLP MED 11 20 MLT OPN (CLIP)
BLADE PHOTON ILLUMINATED (MISCELLANEOUS) IMPLANT
BLADE SURG 15 STRL SS SAFETY (BLADE) ×1 IMPLANT
BULB RESERV EVAC DRAIN JP 100C (MISCELLANEOUS) IMPLANT
CHLORAPREP W/TINT 26 (MISCELLANEOUS) ×1 IMPLANT
CLIP APPLIE 11 MED OPEN (CLIP) IMPLANT
CLIP APPLIE 13 LRG OPEN (CLIP) IMPLANT
CNTNR SPEC 2.5X3XGRAD LEK (MISCELLANEOUS) ×3
CONT SPEC 4OZ STER OR WHT (MISCELLANEOUS) ×3
CONT SPEC 4OZ STRL OR WHT (MISCELLANEOUS) ×3
CONTAINER SPEC 2.5X3XGRAD LEK (MISCELLANEOUS) ×3 IMPLANT
COVER PROBE GAMMA FINDER SLV (MISCELLANEOUS) ×1 IMPLANT
DRAIN CHANNEL JP 15F RND 16 (MISCELLANEOUS) IMPLANT
DRAIN JP 15F RND RADIO PRF (DRAIN) IMPLANT
DRAPE LAPAROTOMY TRNSV 106X77 (MISCELLANEOUS) ×1 IMPLANT
DRSG GAUZE FLUFF 36X18 (GAUZE/BANDAGES/DRESSINGS) ×1 IMPLANT
DRSG TELFA 3X8 NADH STRL (GAUZE/BANDAGES/DRESSINGS) ×1 IMPLANT
ELECT CAUTERY BLADE TIP 2.5 (TIP) ×1
ELECT REM PT RETURN 9FT ADLT (ELECTROSURGICAL) ×1
ELECTRODE CAUTERY BLDE TIP 2.5 (TIP) ×1 IMPLANT
ELECTRODE REM PT RTRN 9FT ADLT (ELECTROSURGICAL) ×1 IMPLANT
GAUZE 4X4 16PLY ~~LOC~~+RFID DBL (SPONGE) ×1 IMPLANT
GLOVE BIO SURGEON STRL SZ7.5 (GLOVE) ×1 IMPLANT
GLOVE SURG UNDER LTX SZ8 (GLOVE) ×1 IMPLANT
GOWN STRL REUS W/ TWL LRG LVL3 (GOWN DISPOSABLE) ×2 IMPLANT
GOWN STRL REUS W/TWL LRG LVL3 (GOWN DISPOSABLE) ×2
LABEL OR SOLS (LABEL) ×1 IMPLANT
MANIFOLD NEPTUNE II (INSTRUMENTS) ×1 IMPLANT
PACK BASIN MINOR ARMC (MISCELLANEOUS) ×1 IMPLANT
PIN SAFETY STRL (MISCELLANEOUS) ×1 IMPLANT
RETRACTOR RING XSMALL (MISCELLANEOUS) IMPLANT
RTRCTR WOUND ALEXIS 13CM XS SH (MISCELLANEOUS)
SHEARS FOC LG CVD HARMONIC 17C (MISCELLANEOUS) IMPLANT
SPONGE T-LAP 18X18 ~~LOC~~+RFID (SPONGE) ×1 IMPLANT
STRIP CLOSURE SKIN 1/2X4 (GAUZE/BANDAGES/DRESSINGS) ×2 IMPLANT
SUT ETHILON 3-0 FS-10 30 BLK (SUTURE) ×1
SUT SILK 2 0 (SUTURE) ×1
SUT SILK 2-0 30XBRD TIE 12 (SUTURE) ×1 IMPLANT
SUT SILK 3 0 (SUTURE) ×1
SUT SILK 3-0 18XBRD TIE 12 (SUTURE) ×1 IMPLANT
SUT VIC AB 2-0 CT1 27 (SUTURE) ×4
SUT VIC AB 2-0 CT1 TAPERPNT 27 (SUTURE) ×4 IMPLANT
SUT VIC AB 3-0 54X BRD REEL (SUTURE) ×2 IMPLANT
SUT VIC AB 3-0 BRD 54 (SUTURE) ×2
SUT VIC AB 3-0 SH 27 (SUTURE) ×1
SUT VIC AB 3-0 SH 27X BRD (SUTURE) ×1 IMPLANT
SUTURE EHLN 3-0 FS-10 30 BLK (SUTURE) ×1 IMPLANT
SWABSTK COMLB BENZOIN TINCTURE (MISCELLANEOUS) ×1 IMPLANT
TAPE TRANSPORE STRL 2 31045 (GAUZE/BANDAGES/DRESSINGS) ×1 IMPLANT
TRAP FLUID SMOKE EVACUATOR (MISCELLANEOUS) ×1 IMPLANT
WATER STERILE IRR 500ML POUR (IV SOLUTION) ×1 IMPLANT

## 2022-10-10 NOTE — Anesthesia Preprocedure Evaluation (Signed)
Anesthesia Evaluation  Patient identified by MRN, date of birth, ID band Patient awake    Reviewed: Allergy & Precautions, H&P , NPO status , Patient's Chart, lab work & pertinent test results, reviewed documented beta blocker date and time   History of Anesthesia Complications Negative for: history of anesthetic complications  Airway Mallampati: II  TM Distance: >3 FB Neck ROM: full    Dental  (+) Teeth Intact, Dental Advidsory Given, Poor Dentition, Missing   Pulmonary neg shortness of breath, neg sleep apnea, neg COPD, neg recent URI, Current Smoker,    Pulmonary exam normal        Cardiovascular Exercise Tolerance: Good hypertension, On Medications (-) angina(-) Past MI and (-) Cardiac Stents Normal cardiovascular exam(-) dysrhythmias (-) Valvular Problems/Murmurs Rhythm:regular Rate:Normal     Neuro/Psych negative neurological ROS  negative psych ROS   GI/Hepatic negative GI ROS, Neg liver ROS,   Endo/Other  negative endocrine ROS  Renal/GU ESRF and DialysisRenal disease  negative genitourinary   Musculoskeletal   Abdominal   Peds  Hematology  (+) Blood dyscrasia, anemia ,   Anesthesia Other Findings Past Medical History: No date: Allergy No date: Cigarette smoker No date: HTN, goal below 140/90 No date: Second degree hemorrhoids Past Surgical History: 04/21/2016: COLONOSCOPY WITH PROPOFOL; N/A     Comment:  Procedure: COLONOSCOPY WITH PROPOFOL;  Surgeon: Lucilla Lame, MD;  Location: [place];  Service:               Endoscopy;  Laterality: N/A; 1994: TUBAL LIGATION     Comment:  After G1P1001 BMI    Body Mass Index: 18.87 kg/m     Reproductive/Obstetrics negative OB ROS                             Anesthesia Physical  Anesthesia Plan  ASA: 4  Anesthesia Plan: General   Post-op Pain Management:    Induction: Intravenous  PONV Risk Score and  Plan: 2 and Ondansetron, Dexamethasone and Treatment may vary due to age or medical condition  Airway Management Planned: LMA  Additional Equipment:   Intra-op Plan:   Post-operative Plan: Extubation in OR  Informed Consent: I have reviewed the patients History and Physical, chart, labs and discussed the procedure including the risks, benefits and alternatives for the proposed anesthesia with the patient or authorized representative who has indicated his/her understanding and acceptance.     Dental Advisory Given  Plan Discussed with: CRNA  Anesthesia Plan Comments:         Anesthesia Quick Evaluation

## 2022-10-10 NOTE — Anesthesia Procedure Notes (Signed)
Procedure Name: LMA Insertion Date/Time: 10/10/2022 11:13 AM  Performed by: Jerrye Noble, CRNAPre-anesthesia Checklist: Patient identified, Emergency Drugs available, Suction available and Patient being monitored Patient Re-evaluated:Patient Re-evaluated prior to induction Oxygen Delivery Method: Circle system utilized Preoxygenation: Pre-oxygenation with 100% oxygen Induction Type: IV induction LMA: LMA inserted LMA Size: 4.0 Number of attempts: 1 Placement Confirmation: positive ETCO2 and breath sounds checked- equal and bilateral Tube secured with: Tape Dental Injury: Teeth and Oropharynx as per pre-operative assessment

## 2022-10-10 NOTE — Op Note (Signed)
Preoperative diagnosis: Invasive mammary carcinoma of the right breast.  Postoperative diagnosis: Same.  Operative procedure: Right simple mastectomy with sentinel node biopsy.  Operating surgeon: Hervey Ard, MD.  Anesthesia: General by LMA.  Estimated blood loss: 20 cc.  : Clinical note this 56 year old woman was recent identified with breast cancer.  Given options for management she desired to proceed to mastectomy.  She underwent injection with technetium sulfur colloid prior to the procedure.  SCD stockings for DVT prevention.  Ancef administered on induction of anesthesia.  Operative note: With the patient under adequate general anesthesia the periareolar skin was cleansed with alcohol followed by injection of 5 cc of 1 half-strength methylene blue.  The breast chest and axilla was then cleansed with ChloraPrep and draped.  An elliptical incision was outlined.  The skin was incised sharply and the remaining dissection completed with the photon blade.  Skin flaps were elevated to the sternum medially, rectus fascia inferiorly, with serratus muscle laterally and to just a fingerbreadth below the clavicle superiorly.  Flaps were about 4-5 mm in thickness.  The breast was rolled laterally and 4 hot blue nodes and 1 additional hot node were identified.  These were removed and sent for routine histology.  The breast was sent for routine histology.  The chest wall wound was irrigated with sterile water.  A 15 [place] Blake drain was brought out through the inferior medial flap and anchored into place with a 3-0 nylon suture.  The skin flaps were approximated with a running 2-0 Vicryl deep dermal suture.  Benzoin, Steri-Strips, Telfa fluff gauze and a compressive wrap were applied.  The patient tolerated procedure well was taken the PACU in stable condition.

## 2022-10-10 NOTE — Discharge Instructions (Addendum)

## 2022-10-10 NOTE — OR Nursing (Signed)
Per OR, per Dr. Bary Castilla to Judee Clara RN, he does not need to see pt in postop prior to discharge.

## 2022-10-10 NOTE — Transfer of Care (Signed)
Immediate Anesthesia Transfer of Care Note  Patient: Joy Ray  Procedure(s) Performed: SIMPLE MASTECTOMY WITH AXILLARY SENTINEL NODE BIOPSY (Right: Breast)  Patient Location: PACU  Anesthesia Type:General  Level of Consciousness: drowsy and patient cooperative  Airway & Oxygen Therapy: Patient Spontanous Breathing and Patient connected to face mask oxygen  Post-op Assessment: Report given to RN and Post -op Vital signs reviewed and stable  Post vital signs: Reviewed and stable  Last Vitals:  Vitals Value Taken Time  BP 106/77 10/10/22 1238  Temp    Pulse 81 10/10/22 1240  Resp 19 10/10/22 1240  SpO2 99 % 10/10/22 1240  Vitals shown include unvalidated device data.  Last Pain:  Vitals:   10/10/22 1014  TempSrc: Temporal  PainSc: 0-No pain         Complications: No notable events documented.

## 2022-10-10 NOTE — H&P (Signed)
Joy Ray [id number] 07-Oct-1966     HPI:  Recently identified right breast cancer. Desires mastectomy.   Medications Prior to Admission  Medication Sig Dispense Refill Last Dose   calcium acetate, Phos Binder, (PHOSLYRA) 667 MG/5ML SOLN Take by mouth 3 (three) times daily with meals.   10/09/2022   fluticasone (FLONASE) 50 MCG/ACT nasal spray SHAKE LIQUID AND USE 2 SPRAYS IN EACH NOSTRIL EVERY DAY 16 g 3 Past Month   furosemide (LASIX) 80 MG tablet Take 25 mg by mouth daily.   10/09/2022   potassium chloride SA (KLOR-CON M) 20 MEQ tablet Take 20 mEq by mouth 2 (two) times daily.   10/09/2022   multivitamin (RENA-VIT) TABS tablet Take 1 tablet by mouth at bedtime. Can take any over-the-counter supplement. (Patient not taking: Reported on 10/03/2022)  0 Not Taking   vitamin B-12 1000 MCG tablet Take 1 tablet (1,000 mcg total) by mouth daily. Can take any over-the-counter supplement. (Patient not taking: Reported on 10/03/2022)   Not Taking   Allergies  Allergen Reactions   Ciprofloxacin     Pt denies   Past Medical History:  Diagnosis Date   Acute kidney injury ([place])    Allergy    Cigarette smoker    HTN, goal below 140/90    Malignant neoplasm of upper-outer quadrant of right breast in female, estrogen receptor positive ([place]) 09/07/2022   a.) CNB 09/07/2022 --> pathology (+) for [place] (G1, ER/PR +, HER2/neu -)   Second degree hemorrhoids    Past Surgical History:  Procedure Laterality Date   BREAST BIOPSY Right 09/07/2022   u/s bx coil clip --> pathology (+) for [place] (G1, ER/PR +, HER2/neu -)   CAPD INSERTION N/A 11/02/2021   Procedure: LAPAROSCOPIC INSERTION CONTINUOUS AMBULATORY PERITONEAL DIALYSIS  (CAPD) CATHETER;  Surgeon: Jules Husbands, MD;  Location: [facility];  Service: General;  Laterality: N/A;   COLONOSCOPY WITH PROPOFOL N/A 04/21/2016   Procedure: COLONOSCOPY WITH PROPOFOL;  Surgeon: Lucilla Lame, MD;  Location: [place];  Service: Endoscopy;   Laterality: N/A;   INSERTION OF MESH  11/02/2021   Procedure: INSERTION OF MESH;  Surgeon: Jules Husbands, MD;  Location: [facility];  Service: General;;   TUBAL LIGATION  12/19/1992   After G1P1001   Social History   Socioeconomic History   Marital status: Single    Spouse name: Not on file   Number of children: 1   Years of education: Not on file   Highest education level: Not on file  Occupational History   Not on file  Tobacco Use   Smoking status: Light Smoker    Packs/day: 0.25    Years: 20.00    Total pack years: 5.00    Types: Cigarettes   Smokeless tobacco: Never   Tobacco comments:    pt has cut back to 1 or 2 cigs/day  Vaping Use   Vaping Use: Never used  Substance and Sexual Activity   Alcohol use: Not Currently    Comment:     Drug use: No   Sexual activity: Not Currently  Other Topics Concern   Not on file  Social History Narrative   Not on file   Social Determinants of Health   Financial Resource Strain: Low Risk  (03/28/2022)   Overall Financial Resource Strain (CARDIA)    Difficulty of Paying Living Expenses: Not hard at all  Food Insecurity: No Food Insecurity (03/28/2022)   Hunger Vital Sign    Worried About Running Out  of Food in the Last Year: Never true    [place] in the Last Year: Never true  Transportation Needs: No Transportation Needs (03/28/2022)   PRAPARE - Hydrologist (Medical): No    Lack of Transportation (Non-Medical): No  Physical Activity: Insufficiently Active (03/28/2022)   Exercise Vital Sign    Days of Exercise per Week: 3 days    Minutes of Exercise per Session: 20 min  Stress: No Stress Concern Present (03/28/2022)   [place]    Feeling of Stress : Only a little  Social Connections: Moderately Isolated (03/28/2022)   Social Connection and Isolation Panel [NHANES]    Frequency of Communication with Friends and Family: More  than three times a week    Frequency of Social Gatherings with Friends and Family: More than three times a week    Attends Religious Services: 1 to 4 times per year    Active Member of [employer] or Organizations: No    Attends Archivist Meetings: Never    Marital Status: Never married  Intimate Partner Violence: Not At Risk (03/28/2022)   Humiliation, Afraid, Rape, and Kick questionnaire    Fear of Current or Ex-Partner: No    Emotionally Abused: No    Physically Abused: No    Sexually Abused: No   Social History   Social History Narrative   Not on file     ROS: Negative.     PE: HEENT: Negative. Lungs: Clear. Cardio: RR.   Assessment/Plan:  Proceed with planned riht mastectomy and SLN exam.   [facility] 10/10/2022

## 2022-10-11 ENCOUNTER — Encounter: Payer: Self-pay | Admitting: General Surgery

## 2022-10-12 LAB — SURGICAL PATHOLOGY

## 2022-10-17 ENCOUNTER — Ambulatory Visit: Payer: 59 | Admitting: Oncology

## 2022-10-24 ENCOUNTER — Encounter: Payer: Self-pay | Admitting: Oncology

## 2022-10-24 ENCOUNTER — Inpatient Hospital Stay: Payer: 59 | Attending: Oncology | Admitting: Oncology

## 2022-10-24 VITALS — BP 116/88 | HR 89 | Temp 96.7°F | Resp 20 | Wt 122.0 lb

## 2022-10-24 DIAGNOSIS — Z7189 Other specified counseling: Secondary | ICD-10-CM | POA: Diagnosis not present

## 2022-10-24 DIAGNOSIS — Z780 Asymptomatic menopausal state: Secondary | ICD-10-CM

## 2022-10-24 DIAGNOSIS — Z992 Dependence on renal dialysis: Secondary | ICD-10-CM | POA: Diagnosis not present

## 2022-10-24 DIAGNOSIS — Z803 Family history of malignant neoplasm of breast: Secondary | ICD-10-CM | POA: Insufficient documentation

## 2022-10-24 DIAGNOSIS — F17210 Nicotine dependence, cigarettes, uncomplicated: Secondary | ICD-10-CM | POA: Insufficient documentation

## 2022-10-24 DIAGNOSIS — Z170 Estrogen receptor positive status [ER+]: Secondary | ICD-10-CM | POA: Insufficient documentation

## 2022-10-24 DIAGNOSIS — C50411 Malignant neoplasm of upper-outer quadrant of right female breast: Secondary | ICD-10-CM | POA: Insufficient documentation

## 2022-10-24 MED ORDER — ANASTROZOLE 1 MG PO TABS: 1.0000 mg | ORAL_TABLET | Freq: Every day | ORAL | 3 refills | Status: DC

## 2022-10-25 ENCOUNTER — Encounter: Payer: Self-pay | Admitting: *Deleted

## 2022-10-25 NOTE — Progress Notes (Signed)
Hematology/Oncology Consult note [facility]  Telephone:([phone] [phone]  Patient Care Team: Cade as PCP - General (Family Medicine) Daiva Huge, RN as Oncology Nurse Navigator   Name of the patient: Joy Ray  [id number]  1966/08/12   Date of visit: 10/25/22  Diagnosis-  Pathologic prognostic stage Ia invasive mammary carcinoma of the right breast pT1 aN0 M0 ER/PR positive HER2 negative s/p right mastectomy  Chief complaint/ Reason for visit-discuss final pathology results and further management  Heme/Onc history: patient is a 56 year old female who underwent a bilateral screening mammogram in August 2023 which showed a possible distortion in the right breast.  This was followed by a diagnostic mammogram and ultrasound which showed a 3 x 3 x 3 mm irregular hypoechoic mass in the right breast.  No suspicious right axillary adenopathy.  This was biopsied and was consistent with a 5 mm invasive mammary carcinoma grade 1 ER greater than 90% positive PR greater than 90% positive and HER2 negative.   Patient is currently on home peritoneal dialysis 7 days a week.  Her mother was also diagnosed with breast cancer and had a negative genetic testing.  She was on progesterone injections up until 2 months ago.  Even while on injection she was not getting any menstrual cycles and has not had any return of menses despite stopping her injections she is G1, P1.  No prior abnormal breast biopsies.    Interval history-patient is doing well post mastectomy.  She still has some soreness at the site of surgery but is otherwise healing well.  ECOG PS- 1 Pain scale- 3   Review of systems- Review of Systems  Constitutional:  Negative for chills, fever, malaise/fatigue and weight loss.  HENT:  Negative for congestion, ear discharge and nosebleeds.   Eyes:  Negative for blurred vision.  Respiratory:  Negative for cough, hemoptysis,  sputum production, shortness of breath and wheezing.   Cardiovascular:  Negative for chest pain, palpitations, orthopnea and claudication.  Gastrointestinal:  Negative for abdominal pain, blood in stool, constipation, diarrhea, heartburn, melena, nausea and vomiting.  Genitourinary:  Negative for dysuria, flank pain, frequency, hematuria and urgency.  Musculoskeletal:  Negative for back pain, joint pain and myalgias.  Skin:  Negative for rash.  Neurological:  Negative for dizziness, tingling, focal weakness, seizures, weakness and headaches.  Endo/Heme/Allergies:  Does not bruise/bleed easily.  Psychiatric/Behavioral:  Negative for depression and suicidal ideas. The patient does not have insomnia.       Allergies  Allergen Reactions   Ciprofloxacin     Pt denies     Past Medical History:  Diagnosis Date   Acute kidney injury ([place])    Allergy    Cigarette smoker    HTN, goal below 140/90    Malignant neoplasm of upper-outer quadrant of right breast in female, estrogen receptor positive ([place]) 09/07/2022   a.) CNB 09/07/2022 --> pathology (+) for [place] (G1, ER/PR +, HER2/neu -)   Second degree hemorrhoids      Past Surgical History:  Procedure Laterality Date   BREAST BIOPSY Right 09/07/2022   u/s bx coil clip --> pathology (+) for [place] (G1, ER/PR +, HER2/neu -)   CAPD INSERTION N/A 11/02/2021   Procedure: LAPAROSCOPIC INSERTION CONTINUOUS AMBULATORY PERITONEAL DIALYSIS  (CAPD) CATHETER;  Surgeon: Jules Husbands, MD;  Location: [facility];  Service: General;  Laterality: N/A;   COLONOSCOPY WITH PROPOFOL N/A 04/21/2016   Procedure: COLONOSCOPY WITH PROPOFOL;  Surgeon: Lucilla Lame, MD;  Location: [place];  Service: Endoscopy;  Laterality: N/A;   INSERTION OF MESH  11/02/2021   Procedure: INSERTION OF MESH;  Surgeon: Jules Husbands, MD;  Location: [facility];  Service: General;;   SIMPLE MASTECTOMY WITH AXILLARY SENTINEL NODE BIOPSY Right 10/10/2022   Procedure: SIMPLE  MASTECTOMY WITH AXILLARY SENTINEL NODE BIOPSY;  Surgeon: Robert Bellow, MD;  Location: [facility];  Service: General;  Laterality: Right;   TUBAL LIGATION  12/19/1992   After G1P1001    Social History   Socioeconomic History   Marital status: Single    Spouse name: Not on file   Number of children: 1   Years of education: Not on file   Highest education level: Not on file  Occupational History   Not on file  Tobacco Use   Smoking status: Light Smoker    Packs/day: 0.25    Years: 20.00    Total pack years: 5.00    Types: Cigarettes   Smokeless tobacco: Never   Tobacco comments:    pt has cut back to 1 or 2 cigs/day  Vaping Use   Vaping Use: Never used  Substance and Sexual Activity   Alcohol use: Not Currently    Comment:     Drug use: No   Sexual activity: Not Currently  Other Topics Concern   Not on file  Social History Narrative   Not on file   Social Determinants of Health   Financial Resource Strain: Low Risk  (03/28/2022)   Overall Financial Resource Strain (CARDIA)    Difficulty of Paying Living Expenses: Not hard at all  Food Insecurity: No Food Insecurity (03/28/2022)   Hunger Vital Sign    Worried About Running Out of Food in the Last Year: Never true    Ran Out of Food in the Last Year: Never true  Transportation Needs: No Transportation Needs (03/28/2022)   PRAPARE - Hydrologist (Medical): No    Lack of Transportation (Non-Medical): No  Physical Activity: Insufficiently Active (03/28/2022)   Exercise Vital Sign    Days of Exercise per Week: 3 days    Minutes of Exercise per Session: 20 min  Stress: No Stress Concern Present (03/28/2022)   [place]    Feeling of Stress : Only a little  Social Connections: Moderately Isolated (03/28/2022)   Social Connection and Isolation Panel [NHANES]    Frequency of Communication with Friends and Family: More than three  times a week    Frequency of Social Gatherings with Friends and Family: More than three times a week    Attends Religious Services: 1 to 4 times per year    Active Member of [employer] or Organizations: No    Attends Archivist Meetings: Never    Marital Status: Never married  Intimate Partner Violence: Not At Risk (03/28/2022)   Humiliation, Afraid, Rape, and Kick questionnaire    Fear of Current or Ex-Partner: No    Emotionally Abused: No    Physically Abused: No    Sexually Abused: No    Family History  Problem Relation Age of Onset   Cancer Mother        breast   Breast cancer Mother 37   Pancreatic cancer Mother        neg [employer] [facility] genetic testing 2021   Breast cancer Maternal Aunt    Breast cancer Maternal Aunt  Current Outpatient Medications:    anastrozole (ARIMIDEX) 1 MG tablet, Take 1 tablet (1 mg total) by mouth daily., Disp: 30 tablet, Rfl: 3   calcium acetate, Phos Binder, (PHOSLYRA) 667 MG/5ML SOLN, Take by mouth 3 (three) times daily with meals., Disp: , Rfl:    fluticasone (FLONASE) 50 MCG/ACT nasal spray, SHAKE LIQUID AND USE 2 SPRAYS IN EACH NOSTRIL EVERY DAY, Disp: 16 g, Rfl: 3   furosemide (LASIX) 80 MG tablet, Take 25 mg by mouth daily., Disp: , Rfl:    HYDROcodone-acetaminophen (NORCO/VICODIN) 5-325 MG tablet, 1/2 tablet every 4-6 hours if needed for pain., Disp: 15 tablet, Rfl: 0   potassium chloride SA (KLOR-CON M) 20 MEQ tablet, Take 20 mEq by mouth 2 (two) times daily., Disp: , Rfl:   Physical exam:  Vitals:   10/24/22 1407  BP: 116/88  Pulse: 89  Resp: 20  Temp: (!) 96.7 F (35.9 C)  SpO2: 100%  Weight: 122 lb (55.3 kg)   Physical Exam Constitutional:      General: She is not in acute distress. Cardiovascular:     Rate and Rhythm: Normal rate and regular rhythm.     Heart sounds: Normal heart sounds.  Pulmonary:     Effort: Pulmonary effort is normal.     Breath sounds: Normal breath sounds.  Skin:    General: Skin is warm  and dry.  Neurological:     Mental Status: She is alert and oriented to person, place, and time.  Chest wall exam: Patient is s/p right mastectomy without reconstruction.  Steri-Strips in place.  Breast drain is out     Latest Ref Rng & Units 10/10/2022   10:11 AM  CMP  Glucose 70 - 99 mg/dL 81   BUN 6 - 20 mg/dL 35   Creatinine 0.44 - 1.00 mg/dL 6.90   Sodium 135 - 145 mmol/L 135   Potassium 3.5 - 5.1 mmol/L 4.2   Chloride 98 - 111 mmol/L 104       Latest Ref Rng & Units 10/10/2022   10:11 AM  CBC  Hemoglobin 12.0 - 15.0 g/dL 13.9   Hematocrit 36.0 - 46.0 % 41.0     No images are attached to the encounter.  NM Sentinel Node Inj-No Rpt (Breast)  Result Date: 10/10/2022 Sulfur Colloid was injected by the Nuclear Medicine Technologist for sentinel lymph node localization.     Assessment and plan- Patient is a 56 y.o. female with pathological prognostic stage Ia invasive mammary carcinoma of the right breast pT1 a N0 M0 ER/PR positive HER2 negative here to discuss final management  Biopsy specimen showed a 5 mm grade 1 invasive mammary carcinoma and final right mastectomy pathology also shows a 5 mm grade 1 invasive mammary carcinoma that is strongly ER/PR positive and HER2 negative.  Patient underwent mastectomy since she did not wish to undergo any adjuvant radiation therapy.  Given that her final pathology shows a tumor size of less than 1 cm and grade 1 histology she does not require any Oncotype testing to determine if she would require adjuvant chemotherapy or not.  As such patient would not benefit from adjuvant chemotherapy.  Given that her tumor is ER/PR positive I do recommend endocrine therapy for her.  Discussed risks and benefits of Arimidex including all but not limited to possible hot flashes mood swings arthralgias and worsening bone health.  I will send a prescription for Arimidex to her pharmacy.  She will take that for 5 years.  I will also obtain a baseline bone  density scan.  I will see her back in 3 months with CMP.  Treatment will be given with a curative intent   Cancer Staging  Malignant neoplasm of upper-outer quadrant of right breast in female, estrogen receptor positive ([place]) Staging form: Breast, AJCC 8th Edition - Clinical stage from 09/14/2022: Stage IA (cT1a, cN0, cM0, G1, ER+, PR+, HER2-) - Signed by Sindy Guadeloupe, MD on 09/14/2022 Histologic grading system: 3 grade system - Pathologic stage from 10/25/2022: Stage IA (pT1a, pN0, cM0, G1, ER+, PR+, HER2-) - Signed by Sindy Guadeloupe, MD on 10/25/2022 Histologic grading system: 3 grade system     Visit Diagnosis 1. Malignant neoplasm of upper-outer quadrant of right breast in female, estrogen receptor positive ([place])   2. Goals of care, counseling/discussion      Dr. Randa Evens, MD, MPH [facility] at [facility] [phone] 10/25/2022 10:31 AM

## 2022-10-25 NOTE — Anesthesia Postprocedure Evaluation (Signed)
Anesthesia Post Note  Patient: Joy Ray  Procedure(s) Performed: SIMPLE MASTECTOMY WITH AXILLARY SENTINEL NODE BIOPSY (Right: Breast)  Patient location during evaluation: PACU Anesthesia Type: General Level of consciousness: awake and alert Pain management: pain level controlled Vital Signs Assessment: post-procedure vital signs reviewed and stable Respiratory status: spontaneous breathing, nonlabored ventilation, respiratory function stable and patient connected to nasal cannula oxygen Cardiovascular status: blood pressure returned to baseline and stable Postop Assessment: no apparent nausea or vomiting Anesthetic complications: no   No notable events documented.   Last Vitals:  Vitals:   10/10/22 1315 10/10/22 1333  BP: 118/82 130/86  Pulse: 63 62  Resp: 19 18  Temp: 36.6 C 36.6 C  SpO2: 97% 100%    Last Pain:  Vitals:   10/10/22 1333  TempSrc: Temporal  PainSc: 0-No pain                 Martha Clan

## 2022-11-16 ENCOUNTER — Ambulatory Visit: Payer: 59 | Admitting: Occupational Therapy

## 2022-11-23 ENCOUNTER — Inpatient Hospital Stay: Payer: Medicare Other | Attending: Oncology | Admitting: Occupational Therapy

## 2022-11-23 DIAGNOSIS — Z9011 Acquired absence of right breast and nipple: Secondary | ICD-10-CM

## 2022-11-23 NOTE — Therapy (Signed)
[facility] at [facility] [address], [place], [place], [address] Phone: [phone]   Fax:  [phone]  Occupational Therapy Screen  Patient Details  Name: Joy Ray MRN: [id number] Date of Birth: July 28, 1966 No data recorded  Encounter Date: 11/23/2022   OT End of Session - 11/23/22 1549     Visit Number 0             Past Medical History:  Diagnosis Date   Acute kidney injury ([place])    Allergy    Cigarette smoker    HTN, goal below 140/90    Malignant neoplasm of upper-outer quadrant of right breast in female, estrogen receptor positive ([place]) 09/07/2022   a.) CNB 09/07/2022 --> pathology (+) for [facility] (G1, ER/PR +, HER2/neu -)   Second degree hemorrhoids     Past Surgical History:  Procedure Laterality Date   BREAST BIOPSY Right 09/07/2022   u/s bx coil clip --> pathology (+) for [place] (G1, ER/PR +, HER2/neu -)   CAPD INSERTION N/A 11/02/2021   Procedure: LAPAROSCOPIC INSERTION CONTINUOUS AMBULATORY PERITONEAL DIALYSIS  (CAPD) CATHETER;  Surgeon: Jules Husbands, MD;  Location: [facility];  Service: General;  Laterality: N/A;   COLONOSCOPY WITH PROPOFOL N/A 04/21/2016   Procedure: COLONOSCOPY WITH PROPOFOL;  Surgeon: Lucilla Lame, MD;  Location: [place];  Service: Endoscopy;  Laterality: N/A;   INSERTION OF MESH  11/02/2021   Procedure: INSERTION OF MESH;  Surgeon: Jules Husbands, MD;  Location: [facility];  Service: General;;   SIMPLE MASTECTOMY WITH AXILLARY SENTINEL NODE BIOPSY Right 10/10/2022   Procedure: SIMPLE MASTECTOMY WITH AXILLARY SENTINEL NODE BIOPSY;  Surgeon: Robert Bellow, MD;  Location: [facility];  Service: General;  Laterality: Right;   TUBAL LIGATION  12/19/1992   After G1P1001    There were no vitals filed for this visit.   Subjective Assessment - 11/23/22 1546     Subjective  Doing well - I don't need radiation or chemo -that is why I decided to have mastectomy - Can go and get  bra and prosthesis yet    Currently in Pain? No/denies                 LYMPHEDEMA/ONCOLOGY QUESTIONNAIRE - 11/23/22 0001       Right Upper Extremity Lymphedema   10 cm Proximal to Olecranon Process 23 cm    Olecranon Process 22.5 cm    15 cm Proximal to Ulnar Styloid Process 20 cm    Just Proximal to Ulnar Styloid Process 14.2 cm      Left Upper Extremity Lymphedema   10 cm Proximal to Olecranon Process 22.5 cm    Olecranon Process 22 cm    15 cm Proximal to Ulnar Styloid Process 19.5 cm    Just Proximal to Ulnar Styloid Process 14 cm             Assessment and plan-Archana C, MD on 10/25/2022 Histologic grading system: 3 grade system  Patient is a 56 y.o. female with pathological prognostic stage Ia invasive mammary carcinoma of the right breast pT1 a N0 M0 ER/PR positive HER2 negative here to discuss final management   Biopsy specimen showed a 5 mm grade 1 invasive mammary carcinoma and final right mastectomy pathology also shows a 5 mm grade 1 invasive mammary carcinoma that is strongly ER/PR positive and HER2 negative.  Patient underwent mastectomy since she did not wish to undergo any adjuvant radiation therapy.  Given  that her final pathology shows a tumor size of less than 1 cm and grade 1 histology she does not require any Oncotype testing to determine if she would require adjuvant chemotherapy or not.  As such patient would not benefit from adjuvant chemotherapy.   Given that her tumor is ER/PR positive I do recommend endocrine therapy for her.  Discussed risks and benefits of Arimidex including all but not limited to possible hot flashes mood swings arthralgias and worsening bone health.  I will send a prescription for Arimidex to her pharmacy.  She will take that for 5 years.  I will also obtain a baseline bone density scan.  I will see her back in 3 months with CMP.  Treatment will be given with a curative intent    Cancer Staging  Malignant neoplasm of  upper-outer quadrant of right breast in female, estrogen receptor positive ([place]) Staging form: Breast, AJCC 8th Edition - Clinical stage from 09/14/2022: Stage IA (cT1a, cN0, cM0, G1, ER+, PR+, HER2-) - Signed by Sindy Guadeloupe, MD on 09/14/2022 Histologic grading system: 3 grade system - Pathologic stage from 10/25/2022: Stage IA (pT1a, pN0, cM0, G1, ER+, PR+, HER2-) - Signed by Janese Banks,         [place] 11/23/22:  Pt arrive 6 wks s/p R mastectomy - pt with bilateral AROM WNL -slight pull in R axilla - but no pain -only some soreness. Pt had some fluid drain on 10/31 and 11/16- following up with surgeon again next week. Pt will ask surgeon about referral to [facility] for bra and prosthesis fitting. Pt was provided with hand out from cancer journal on lymphedema- signs, symptoms and precautions - pt low risk but info was reviewed. Pt can follow up as needed .                             Visit Diagnosis: S/P right mastectomy    Problem List Patient Active Problem List   Diagnosis Date Noted   Malignant neoplasm of upper-outer quadrant of right breast in female, estrogen receptor positive ([place]) 09/14/2022   Goals of care, counseling/discussion 09/14/2022   History of tobacco abuse 06/26/2022   Awaiting organ transplant status 06/26/2022   ESRD on peritoneal dialysis ([place]) 03/29/2022   Dysmenorrhea 03/29/2022   Protein-calorie malnutrition, severe 11/01/2021   Anemia due to folic acid deficiency    B12 deficiency    Thrombocytopenia (H[facility])    Anemia in chronic kidney disease, on chronic dialysis ([place])    Acute respiratory failure with hypoxia ([place]) 10/26/2021   History of smoking 10-25 pack years 04/18/2018   Allergic rhinitis 10/13/2015   H/O abnormal cervical Papanicolaou smear 10/13/2015   Hypertension goal BP (blood pressure) < 140/90 10/13/2015    Rosalyn Gess, OTR/L,CLT 11/23/2022, 3:51 PM  [facility] at [facility] [address], [place], [place], [address] Phone: [phone]   Fax:  [phone]  Name: Joy Ray MRN: [id number] Date of Birth: Jan 27, 1966

## 2022-12-05 ENCOUNTER — Telehealth: Payer: Self-pay

## 2022-12-05 NOTE — Telephone Encounter (Signed)
left vm stating we have recieved Long Term Disability paperwork in order for Joy Ray to be able to fill them out she will need to fax over her job description to [place] as well before [name] can even fill out what she can. Otherwise paper will be on hold.

## 2022-12-06 NOTE — Telephone Encounter (Signed)
Pt states she in not aware of any long term disability paperwork  Pt states she is not employed and receive social security  Pt requesting a cb to discuss further, pt will be available today until 12pm

## 2022-12-06 NOTE — Telephone Encounter (Signed)
Spoke to patient, pt states she no longer works for [employer] and she does not need paperwork to be filled out. I will shred paperwork.

## 2023-01-04 ENCOUNTER — Ambulatory Visit
Admission: RE | Admit: 2023-01-04 | Discharge: 2023-01-04 | Disposition: A | Discharge status: Home / Self Care | Payer: 59 | Source: Ambulatory Visit | Attending: Oncology | Admitting: Oncology

## 2023-01-04 DIAGNOSIS — Z7189 Other specified counseling: Secondary | ICD-10-CM | POA: Diagnosis not present

## 2023-01-04 DIAGNOSIS — F17200 Nicotine dependence, unspecified, uncomplicated: Secondary | ICD-10-CM | POA: Diagnosis not present

## 2023-01-04 DIAGNOSIS — Z79899 Other long term (current) drug therapy: Secondary | ICD-10-CM | POA: Diagnosis not present

## 2023-01-04 DIAGNOSIS — Z170 Estrogen receptor positive status [ER+]: Secondary | ICD-10-CM | POA: Insufficient documentation

## 2023-01-04 DIAGNOSIS — N289 Disorder of kidney and ureter, unspecified: Secondary | ICD-10-CM | POA: Diagnosis not present

## 2023-01-04 DIAGNOSIS — Z13820 Encounter for screening for osteoporosis: Secondary | ICD-10-CM | POA: Insufficient documentation

## 2023-01-04 DIAGNOSIS — Z780 Asymptomatic menopausal state: Secondary | ICD-10-CM | POA: Diagnosis not present

## 2023-01-04 DIAGNOSIS — C50411 Malignant neoplasm of upper-outer quadrant of right female breast: Secondary | ICD-10-CM | POA: Diagnosis present

## 2023-01-09 ENCOUNTER — Telehealth: Payer: Self-pay | Admitting: Family Medicine

## 2023-01-09 NOTE — Telephone Encounter (Signed)
Copied from [place] [id number]. Topic: General - Other >> Jan 09, 2023  1:01 PM Sabas Sous wrote: Reason for CRM: [place] [employer]   Best contact: [phone] [facility]) please contact and confirm if fax was received from 01/05/2023. They provider signature and clinical notes

## 2023-01-09 NOTE — Telephone Encounter (Signed)
Called Joy Ray back and informed her since orders were not done by provider here in this office, we can not sign them. Joy Ray verbalized understanding

## 2023-01-25 ENCOUNTER — Inpatient Hospital Stay: Payer: 59

## 2023-01-25 ENCOUNTER — Encounter: Payer: Self-pay | Admitting: Oncology

## 2023-01-25 ENCOUNTER — Inpatient Hospital Stay: Payer: 59 | Attending: Oncology | Admitting: Oncology

## 2023-01-25 VITALS — BP 124/92 | HR 89 | Temp 97.9°F | Resp 18 | Ht 69.0 in | Wt 128.0 lb

## 2023-01-25 DIAGNOSIS — Z803 Family history of malignant neoplasm of breast: Secondary | ICD-10-CM | POA: Insufficient documentation

## 2023-01-25 DIAGNOSIS — C50411 Malignant neoplasm of upper-outer quadrant of right female breast: Secondary | ICD-10-CM | POA: Diagnosis present

## 2023-01-25 DIAGNOSIS — Z992 Dependence on renal dialysis: Secondary | ICD-10-CM | POA: Insufficient documentation

## 2023-01-25 DIAGNOSIS — F17210 Nicotine dependence, cigarettes, uncomplicated: Secondary | ICD-10-CM | POA: Insufficient documentation

## 2023-01-25 DIAGNOSIS — Z79811 Long term (current) use of aromatase inhibitors: Secondary | ICD-10-CM | POA: Diagnosis not present

## 2023-01-25 DIAGNOSIS — Z9011 Acquired absence of right breast and nipple: Secondary | ICD-10-CM | POA: Insufficient documentation

## 2023-01-25 DIAGNOSIS — Z170 Estrogen receptor positive status [ER+]: Secondary | ICD-10-CM | POA: Insufficient documentation

## 2023-01-25 DIAGNOSIS — Z08 Encounter for follow-up examination after completed treatment for malignant neoplasm: Secondary | ICD-10-CM

## 2023-01-25 DIAGNOSIS — Z5181 Encounter for therapeutic drug level monitoring: Secondary | ICD-10-CM

## 2023-01-25 LAB — COMPREHENSIVE METABOLIC PANEL
ALT: 12 U/L (ref 0–44)
AST: 14 U/L — ABNORMAL LOW (ref 15–41)
Albumin: 3.3 g/dL — ABNORMAL LOW (ref 3.5–5.0)
Alkaline Phosphatase: 109 U/L (ref 38–126)
Anion gap: 9 (ref 5–15)
BUN: 29 mg/dL — ABNORMAL HIGH (ref 6–20)
CO2: 26 mmol/L (ref 22–32)
Calcium: 9 mg/dL (ref 8.9–10.3)
Chloride: 101 mmol/L (ref 98–111)
Creatinine, Ser: 3.88 mg/dL — ABNORMAL HIGH (ref 0.44–1.00)
GFR, Estimated: 13 mL/min — ABNORMAL LOW (ref >60)
Glucose, Bld: 90 mg/dL (ref 70–99)
Potassium: 3.9 mmol/L (ref 3.5–5.1)
Sodium: 136 mmol/L (ref 135–145)
Total Bilirubin: 0.3 mg/dL (ref 0.3–1.2)
Total Protein: 6.7 g/dL (ref 6.5–8.1)

## 2023-01-25 NOTE — Progress Notes (Signed)
Hematology/Oncology Consult note [facility]  [phone] [phone]  Patient Care Team: [place] as PCP - General (Family Medicine) Daiva Huge, RN as Oncology Nurse Navigator   Name of the patient: Joy Ray  [id number]  11/12/1966   Date of visit: 01/25/23  Diagnosis-  Pathologic prognostic stage Ia invasive mammary carcinoma of the right breast pT1 aN0 M0 ER/PR positive HER2 negative s/p right mastectomy    Chief complaint/ Reason for visit-routine follow-up of breast cancer on Arimidex  Heme/Onc history:  patient is a 57 year old female who underwent a bilateral screening mammogram in August 2023 which showed a possible distortion in the right breast.  This was followed by a diagnostic mammogram and ultrasound which showed a 3 x 3 x 3 mm irregular hypoechoic mass in the right breast.  No suspicious right axillary adenopathy.  This was biopsied and was consistent with a 5 mm invasive mammary carcinoma grade 1 ER greater than 90% positive PR greater than 90% positive and HER2 negative.   Patient is currently on home peritoneal dialysis 7 days a week.  Her mother was also diagnosed with breast cancer and had a negative genetic testing.  She was on progesterone injections in the past and has not had any return of cycles after stopping those injections.    Patient underwent a right mastectomy with Dr. Bary Castilla in October 2023.  Final pathology showed 5 mm grade 1 invasive mammary carcinoma with negative margins.  5 sentinel lymph nodes negative for malignancy she did not require any adjuvant chemotherapy.  Patient declined adjuvant radiation therapy.  She was started on Arimidex in November 2023.  Baseline bone density scan normal.  Interval history-patient is tolerating Arimidex well without any significant side effects. She is also on calcium and vitamin D.  No menstrual cycles while of birth control.  ECOG PS-  1 Pain scale- 0  Review of systems- Review of Systems  Constitutional:  Positive for malaise/fatigue. Negative for chills, fever and weight loss.  HENT:  Negative for congestion, ear discharge and nosebleeds.   Eyes:  Negative for blurred vision.  Respiratory:  Negative for cough, hemoptysis, sputum production, shortness of breath and wheezing.   Cardiovascular:  Negative for chest pain, palpitations, orthopnea and claudication.  Gastrointestinal:  Negative for abdominal pain, blood in stool, constipation, diarrhea, heartburn, melena, nausea and vomiting.  Genitourinary:  Negative for dysuria, flank pain, frequency, hematuria and urgency.  Musculoskeletal:  Negative for back pain, joint pain and myalgias.  Skin:  Negative for rash.  Neurological:  Negative for dizziness, tingling, focal weakness, seizures, weakness and headaches.  Endo/Heme/Allergies:  Does not bruise/bleed easily.  Psychiatric/Behavioral:  Negative for depression and suicidal ideas. The patient does not have insomnia.       Allergies  Allergen Reactions   Ciprofloxacin     Pt denies     Past Medical History:  Diagnosis Date   Acute kidney injury ([place])    Allergy    Cigarette smoker    HTN, goal below 140/90    Malignant neoplasm of upper-outer quadrant of right breast in female, estrogen receptor positive ([place]) 09/07/2022   a.) CNB 09/07/2022 --> pathology (+) for [place] (G1, ER/PR +, HER2/neu -)   Second degree hemorrhoids      Past Surgical History:  Procedure Laterality Date   BREAST BIOPSY Right 09/07/2022   u/s bx coil clip --> pathology (+) for [place] (G1, ER/PR +, HER2/neu -)  CAPD INSERTION N/A 11/02/2021   Procedure: LAPAROSCOPIC INSERTION CONTINUOUS AMBULATORY PERITONEAL DIALYSIS  (CAPD) CATHETER;  Surgeon: Jules Husbands, MD;  Location: [facility];  Service: General;  Laterality: N/A;   COLONOSCOPY WITH PROPOFOL N/A 04/21/2016   Procedure: COLONOSCOPY WITH PROPOFOL;  Surgeon: Lucilla Lame, MD;   Location: [place];  Service: Endoscopy;  Laterality: N/A;   INSERTION OF MESH  11/02/2021   Procedure: INSERTION OF MESH;  Surgeon: Jules Husbands, MD;  Location: [facility];  Service: General;;   SIMPLE MASTECTOMY WITH AXILLARY SENTINEL NODE BIOPSY Right 10/10/2022   Procedure: SIMPLE MASTECTOMY WITH AXILLARY SENTINEL NODE BIOPSY;  Surgeon: Robert Bellow, MD;  Location: [facility];  Service: General;  Laterality: Right;   TUBAL LIGATION  12/19/1992   After G1P1001    Social History   Socioeconomic History   Marital status: Single    Spouse name: Not on file   Number of children: 1   Years of education: Not on file   Highest education level: Not on file  Occupational History   Not on file  Tobacco Use   Smoking status: Light Smoker    Packs/day: 0.25    Years: 20.00    Total pack years: 5.00    Types: Cigarettes   Smokeless tobacco: Never   Tobacco comments:    pt has cut back to 1 or 2 cigs/day  Vaping Use   Vaping Use: Never used  Substance and Sexual Activity   Alcohol use: Not Currently    Comment:     Drug use: No   Sexual activity: Not Currently  Other Topics Concern   Not on file  Social History Narrative   Not on file   Social Determinants of Health   Financial Resource Strain: Low Risk  (03/28/2022)   Overall Financial Resource Strain (CARDIA)    Difficulty of Paying Living Expenses: Not hard at all  Food Insecurity: No Food Insecurity (03/28/2022)   Hunger Vital Sign    Worried About Running Out of Food in the Last Year: Never true    Ran Out of Food in the Last Year: Never true  Transportation Needs: No Transportation Needs (03/28/2022)   PRAPARE - Hydrologist (Medical): No    Lack of Transportation (Non-Medical): No  Physical Activity: Insufficiently Active (03/28/2022)   Exercise Vital Sign    Days of Exercise per Week: 3 days    Minutes of Exercise per Session: 20 min  Stress: No Stress Concern Present  (03/28/2022)   [place]    Feeling of Stress : Only a little  Social Connections: Moderately Isolated (03/28/2022)   Social Connection and Isolation Panel [NHANES]    Frequency of Communication with Friends and Family: More than three times a week    Frequency of Social Gatherings with Friends and Family: More than three times a week    Attends Religious Services: 1 to 4 times per year    Active Member of [employer] or Organizations: No    Attends Archivist Meetings: Never    Marital Status: Never married  Intimate Partner Violence: Not At Risk (03/28/2022)   Humiliation, Afraid, Rape, and Kick questionnaire    Fear of Current or Ex-Partner: No    Emotionally Abused: No    Physically Abused: No    Sexually Abused: No    Family History  Problem Relation Age of Onset   Cancer Mother  breast   Breast cancer Mother 77   Pancreatic cancer Mother        neg [employer] [facility] genetic testing 2021   Breast cancer Maternal Aunt    Breast cancer Maternal Aunt      Current Outpatient Medications:    anastrozole (ARIMIDEX) 1 MG tablet, Take 1 tablet (1 mg total) by mouth daily., Disp: 30 tablet, Rfl: 3   calcium acetate, Phos Binder, (PHOSLYRA) 667 MG/5ML SOLN, Take by mouth 3 (three) times daily with meals., Disp: , Rfl:    fluticasone (FLONASE) 50 MCG/ACT nasal spray, SHAKE LIQUID AND USE 2 SPRAYS IN EACH NOSTRIL EVERY DAY, Disp: 16 g, Rfl: 3   furosemide (LASIX) 80 MG tablet, Take 25 mg by mouth daily., Disp: , Rfl:    HYDROcodone-acetaminophen (NORCO/VICODIN) 5-325 MG tablet, 1/2 tablet every 4-6 hours if needed for pain., Disp: 15 tablet, Rfl: 0   potassium chloride SA (KLOR-CON M) 20 MEQ tablet, Take 20 mEq by mouth 2 (two) times daily., Disp: , Rfl:   Physical exam:  Vitals:   01/25/23 1401  BP: (!) 124/92  Pulse: 89  Resp: 18  Temp: 97.9 F (36.6 C)  TempSrc: Tympanic  SpO2: 100%  Weight: 128 lb (58.1  kg)  Height: '5\' 9"'$  (1.753 m)   Physical Exam Cardiovascular:     Rate and Rhythm: Normal rate and regular rhythm.     Heart sounds: Normal heart sounds.  Pulmonary:     Effort: Pulmonary effort is normal.     Breath sounds: Normal breath sounds.  Abdominal:     General: Bowel sounds are normal.     Palpations: Abdomen is soft.  Skin:    General: Skin is warm and dry.  Neurological:     Mental Status: She is alert and oriented to person, place, and time.   Breast exam: Patient is s/p right mastectomy without reconstruction.  No evidence of chest wall recurrence.  No palpable right axillary adenopathy.  No palpable masses in the left breast or left axillary adenopathy.     Latest Ref Rng & Units 01/25/2023    1:47 PM  CMP  Glucose 70 - 99 mg/dL 90   BUN 6 - 20 mg/dL 29   Creatinine 0.44 - 1.00 mg/dL 3.88   Sodium 135 - 145 mmol/L 136   Potassium 3.5 - 5.1 mmol/L 3.9   Chloride 98 - 111 mmol/L 101   CO2 22 - 32 mmol/L 26   Calcium 8.9 - 10.3 mg/dL 9.0   Total Protein 6.5 - 8.1 g/dL 6.7   Total Bilirubin 0.3 - 1.2 mg/dL 0.3   Alkaline Phos 38 - 126 U/L 109   AST 15 - 41 U/L 14   ALT 0 - 44 U/L 12       Latest Ref Rng & Units 10/10/2022   10:11 AM  CBC  Hemoglobin 12.0 - 15.0 g/dL 13.9   Hematocrit 36.0 - 46.0 % 41.0     No images are attached to the encounter.  DG Bone Density  Result Date: 01/04/2023 EXAM: DUAL X-RAY ABSORPTIOMETRY (DXA) FOR BONE MINERAL DENSITY IMPRESSION: Your patient Shanzay Fehnel completed a BMD test on 01/04/2023 using the [place] (software version: 14.10) manufactured by [employer]. The following summarizes the results of our evaluation. Technologist::TNB PATIENT BIOGRAPHICAL: Name: Johneisha, Broaden Patient ID: [id number] Birth Date: 1966-06-16 Height: 69.0 in. Gender: Female Exam Date: 01/04/2023 Weight: 122.0 lbs. Indications: Breast CA, Kidney Disease, Low Calcium Intake,  Postmenopausal, Tobacco User(Current Smoker)  Fractures: Treatments: Anastrozole DENSITOMETRY RESULTS: Site         Region     Measured Date Measured Age WHO Classification Young Adult T-score BMD         %Change vs. Previous Significant Change (*) DualFemur Total Left 01/04/2023 54.8 Normal -0.9 0.893 g/cm2 -10.4% Yes DualFemur Total Left 10/10/2016 48.6 Normal -0.1 0.997 g/cm2 - - DualFemur Total Mean 01/04/2023 54.8 Normal -0.9 0.900 g/cm2 -12.5% Yes DualFemur Total Mean 10/10/2016 48.6 Normal 0.2 1.028 g/cm2 - - Left Forearm Radius 33% 01/04/2023 54.8 Normal 0.1 0.887 g/cm2 - - ASSESSMENT: The BMD measured at Femur Total Left is 0.893 g/cm2 with a T-score of -0.9. This patient is considered normal according to [facility]) criteria. Compared with prior study, there has been significant decrease in the left total hip. Compared with prior study, there has been significant decrease in the total mean. Lumbar spine was not utilized due to advanced degenerative changes. Patient is not a candidate for FRAX due to normal bone density exam. The scan quality is good. World Pharmacologist [facility]) criteria for post-menopausal, Caucasian Women: Normal:                   T-score at or above -1 SD Osteopenia/low bone mass: T-score between -1 and -2.5 SD Osteoporosis:             T-score at or below -2.5 SD RECOMMENDATIONS: 1. All patients should optimize calcium and vitamin D intake. 2. Consider FDA-approved medical therapies in postmenopausal women and men aged 80 years and older, based on the following: a. A hip or vertebral(clinical or morphometric) fracture b. T-score < -2.5 at the femoral neck or spine after appropriate evaluation to exclude secondary causes c. Low bone mass (T-score between -1.0 and -2.5 at the femoral neck or spine) and a 10-year probability of a hip fracture > 3% or a 10-year probability of a major osteoporosis-related fracture > 20% based on the [place]-adapted WHO algorithm 3. Clinician judgment and/or patient preferences may  indicate treatment for people with 10-year fracture probabilities above or below these levels FOLLOW-UP: People with diagnosed cases of osteoporosis or at high risk for fracture should have regular bone mineral density tests. For patients eligible for Medicare, routine testing is allowed once every 2 years. The testing frequency can be increased to one year for patients who have rapidly progressing disease, those who are receiving or discontinuing medical therapy to restore bone mass, or have additional risk factors. I have reviewed this report, and agree with the above findings. [facility] Electronically Signed   By: Zerita Boers M.D.   On: 01/04/2023 14:23     Assessment and plan- Patient is a 57 y.o. female with pathological prognostic stage Ia invasive mammary carcinoma of the right breast pT1 a N0 M0 ER/PR positive HER2 negative.  She is s/p right mastectomy without reconstruction and declined adjuvant radiation therapy.  She is currently on Arimidex and this is a routine follow-up visit.  Clinically patient is doing well with no signs and symptoms of recurrence based on today's exam.  Baseline bone density scan is normal.  She will continue to take Arimidex along with calcium and vitamin D for 5 years.  She will be due for a mammogram in August 2024 which I will schedule.  Patient has not had any return of menstrual cycles for about 5 months now despite stopping birth control pill.  I am planning to  check her estradiol and [place] levels at my next visit in 6 months   Visit Diagnosis 1. Encounter for follow-up surveillance of breast cancer   2. Visit for monitoring Arimidex therapy      Dr. Randa Evens, MD, MPH [facility] [phone] 01/25/2023 3:16 PM

## 2023-02-28 ENCOUNTER — Other Ambulatory Visit: Payer: Self-pay | Admitting: *Deleted

## 2023-02-28 ENCOUNTER — Telehealth: Payer: Self-pay | Admitting: *Deleted

## 2023-02-28 MED ORDER — ANASTROZOLE 1 MG PO TABS: 1.0000 mg | ORAL_TABLET | Freq: Every day | ORAL | 3 refills | Status: DC

## 2023-03-01 NOTE — Telephone Encounter (Signed)
Error

## 2023-03-29 NOTE — Patient Instructions (Incomplete)
Preventive Care 40-55 Years Old, Female Preventive care refers to lifestyle choices and visits with your health care provider that can promote health and wellness. Preventive care visits are also called wellness exams. What can I expect for my preventive care visit? Counseling Your health care provider may ask you questions about your: Medical history, including: Past medical problems. Family medical history. Pregnancy history. Current health, including: Menstrual cycle. Method of birth control. Emotional well-being. Home life and relationship well-being. Sexual activity and sexual health. Lifestyle, including: Alcohol, nicotine or tobacco, and drug use. Access to firearms. Diet, exercise, and sleep habits. Work and work environment. Sunscreen use. Safety issues such as seatbelt and bike helmet use. Physical exam Your health care provider will check your: Height and weight. These may be used to calculate your BMI (body mass index). BMI is a measurement that tells if you are at a healthy weight. Waist circumference. This measures the distance around your waistline. This measurement also tells if you are at a healthy weight and may help predict your risk of certain diseases, such as type 2 diabetes and high blood pressure. Heart rate and blood pressure. Body temperature. Skin for abnormal spots. What immunizations do I need?  Vaccines are usually given at various ages, according to a schedule. Your health care provider will recommend vaccines for you based on your age, medical history, and lifestyle or other factors, such as travel or where you work. What tests do I need? Screening Your health care provider may recommend screening tests for certain conditions. This may include: Lipid and cholesterol levels. Diabetes screening. This is done by checking your blood sugar (glucose) after you have not eaten for a while (fasting). Pelvic exam and Pap test. Hepatitis B test. Hepatitis C  test. HIV (human immunodeficiency virus) test. STI (sexually transmitted infection) testing, if you are at risk. Lung cancer screening. Colorectal cancer screening. Mammogram. Talk with your health care provider about when you should start having regular mammograms. This may depend on whether you have a family history of breast cancer. BRCA-related cancer screening. This may be done if you have a family history of breast, ovarian, tubal, or peritoneal cancers. Bone density scan. This is done to screen for osteoporosis. Talk with your health care provider about your test results, treatment options, and if necessary, the need for more tests. Follow these instructions at home: Eating and drinking  Eat a diet that includes fresh fruits and vegetables, whole grains, lean protein, and low-fat dairy products. Take vitamin and mineral supplements as recommended by your health care provider. Do not drink alcohol if: Your health care provider tells you not to drink. You are pregnant, may be pregnant, or are planning to become pregnant. If you drink alcohol: Limit how much you have to 0-1 drink a day. Know how much alcohol is in your drink. In the [place] one drink equals one 12 oz bottle of beer (355 mL), one 5 oz glass of wine (148 mL), or one 1 oz glass of hard liquor (44 mL). Lifestyle Brush your teeth every morning and night with fluoride toothpaste. Floss one time each day. Exercise for at least 30 minutes 5 or more days each week. Do not use any products that contain nicotine or tobacco. These products include cigarettes, chewing tobacco, and vaping devices, such as e-cigarettes. If you need help quitting, ask your health care provider. Do not use drugs. If you are sexually active, practice safe sex. Use a condom or other form of protection to   prevent STIs. If you do not wish to become pregnant, use a form of birth control. If you plan to become pregnant, see your health care provider for a  prepregnancy visit. Take aspirin only as told by your health care provider. Make sure that you understand how much to take and what form to take. Work with your health care provider to find out whether it is safe and beneficial for you to take aspirin daily. Find healthy ways to manage stress, such as: Meditation, yoga, or listening to music. Journaling. Talking to a trusted person. Spending time with friends and family. Minimize exposure to UV radiation to reduce your risk of skin cancer. Safety Always wear your seat belt while driving or riding in a vehicle. Do not drive: If you have been drinking alcohol. Do not ride with someone who has been drinking. When you are tired or distracted. While texting. If you have been using any mind-altering substances or drugs. Wear a helmet and other protective equipment during sports activities. If you have firearms in your house, make sure you follow all gun safety procedures. Seek help if you have been physically or sexually abused. What's next? Visit your health care provider once a year for an annual wellness visit. Ask your health care provider how often you should have your eyes and teeth checked. Stay up to date on all vaccines. This information is not intended to replace advice given to you by your health care provider. Make sure you discuss any questions you have with your health care provider. Document Revised: 06/02/2021 Document Reviewed: 06/02/2021 Elsevier Patient Education  2023 [employer].  

## 2023-03-30 ENCOUNTER — Encounter: Payer: Self-pay | Admitting: Family Medicine

## 2023-03-30 ENCOUNTER — Ambulatory Visit (INDEPENDENT_AMBULATORY_CARE_PROVIDER_SITE_OTHER): Payer: 59 | Admitting: Family Medicine

## 2023-03-30 VITALS — BP 128/82 | HR 100 | Temp 97.7°F | Resp 16 | Ht 67.25 in | Wt 130.9 lb

## 2023-03-30 DIAGNOSIS — Z0000 Encounter for general adult medical examination without abnormal findings: Secondary | ICD-10-CM | POA: Diagnosis not present

## 2023-03-30 DIAGNOSIS — D696 Thrombocytopenia, unspecified: Secondary | ICD-10-CM

## 2023-03-30 DIAGNOSIS — N186 End stage renal disease: Secondary | ICD-10-CM

## 2023-03-30 DIAGNOSIS — Z992 Dependence on renal dialysis: Secondary | ICD-10-CM

## 2023-03-30 DIAGNOSIS — E43 Unspecified severe protein-calorie malnutrition: Secondary | ICD-10-CM | POA: Diagnosis not present

## 2023-03-30 NOTE — Progress Notes (Signed)
Patient: Joy Ray, Female    DOB: 1966/01/12, 57 y.o.   MRN: [id number] [facility], [place] Visit Date: 03/30/2023  Today's Provider: Danelle Berry, PA-C   Chief Complaint  Patient presents with   Annual Exam   Subjective:   Annual physical exam:  Joy Ray is a 57 y.o. female who presents today for complete physical exam:  Exercise/Activity:  doing well  Diet/nutrition:   heathy Sleep:  sleeping well  Pt on peritoneal dialysis - no recent lab records available Primary nephrologist - Dr. Lanice Schwab - surgeon - new surgeon will be there next f/up due to Dr. Leonard Schwartz retiring [facility] managing cancer care - recent BCA dx with simple right mastectomy   SDOH Screenings   Food Insecurity: No Food Insecurity (03/30/2023)  Housing: Low Risk  (03/30/2023)  Transportation Needs: No Transportation Needs (03/30/2023)  Utilities: Not At Risk (03/30/2023)  Alcohol Screen: Low Risk  (03/30/2023)  Depression (PHQ2-9): Low Risk  (03/30/2023)  Financial Resource Strain: Low Risk  (03/30/2023)  Physical Activity: Inactive (03/30/2023)  Social Connections: Moderately Integrated (03/30/2023)  Stress: No Stress Concern Present (03/30/2023)  Tobacco Use: High Risk (03/30/2023)    USPSTF grade A and B recommendations - reviewed and addressed today  Depression:  Phq 9 completed today by patient, was reviewed by me with patient in the room PHQ score is neg, pt feels good    03/30/2023    2:52 PM 05/27/2022    1:28 PM 03/28/2022    2:45 PM 10/26/2021    1:42 PM  PHQ 2/9 Scores  PHQ - 2 Score 0 0 0 0  PHQ- 9 Score 0 0 0 0      03/30/2023    2:52 PM 05/27/2022    1:28 PM 03/28/2022    2:45 PM 10/26/2021    1:42 PM 03/24/2021    3:21 PM  Depression screen PHQ 2/9  Decreased Interest 0 0 0 0 0  Down, Depressed, Hopeless 0 0 0 0 0  PHQ - 2 Score 0 0 0 0 0  Altered sleeping 0 0 0 0   Tired, decreased energy 0 0 0 0   Change in appetite 0 0 0 0   Feeling bad or failure  about yourself  0 0 0 0   Trouble concentrating 0 0 0 0   Moving slowly or fidgety/restless 0 0 0 0   Suicidal thoughts 0 0 0 0   PHQ-9 Score 0 0 0 0   Difficult doing work/chores Not difficult at all Not difficult at all Not difficult at all      Alcohol screening: Flowsheet Row Office Visit from 03/30/2023 in [facility]  AUDIT-C Score 1       Immunizations and Health Maintenance: Health Maintenance  Topic Date Due   Medicare Annual Wellness (AWV)  Never done   DTaP/Tdap/Td (1 - Tdap) Never done   COVID-19 Vaccine (2 - Janssen risk series) 04/15/2023 (Originally 04/27/2020)   INFLUENZA VACCINE  07/20/2023   MAMMOGRAM  07/26/2023   PAP SMEAR-Modifier  11/11/2024   COLONOSCOPY (Pts 45-75yrs Insurance coverage will need to be confirmed)  04/21/2026   Hepatitis C Screening  Completed   HIV Screening  Completed   Zoster Vaccines- Shingrix  Completed   HPV VACCINES  Aged Out     Hep C Screening:  done  STD testing and prevention (HIV/chl/gon/syphilis):  see above, no additional testing desired by pt today  Intimate partner violence:  safe at home  Sexual History/Pain during Intercourse: Single  Menstrual History/LMP/Abnormal Bleeding:   No LMP recorded.  Incontinence Symptoms: none - still urinating   Breast cancer:  per oncology - recent Dx and surgical mgmt of BCA on arimidex Last Mammogram: *see HM list above BRCA gene screening: mother with BCA   Cervical cancer screening: due next year   Osteoporosis:  dexa per oncology, just done Jan 2024 - reviewed today, dialysis pt - vit D, calcium needs per nephro  Skin cancer:  Hx of skin CA -  NO  Discussed atypical lesions   Colorectal cancer:   Colonoscopy is UTD due in 2027 Discussed concerning signs and sx of CRC  Lung cancer:   Low Dose CT Chest recommended if Age 45-80 years, 20 pack-year currently smoking OR have quit w/in 15years. Patient does not qualify.    Social History    Tobacco Use   Smoking status: Light Smoker    Packs/day: 0.25    Years: 20.00    Additional pack years: 0.00    Total pack years: 5.00    Types: Cigarettes   Smokeless tobacco: Never   Tobacco comments:    pt has cut back to 1 or 2 cigs/day  Vaping Use   Vaping Use: Never used  Substance Use Topics   Alcohol use: Yes    Comment: Rare   Drug use: No     Flowsheet Row Office Visit from 03/30/2023 in [facility]  AUDIT-C Score 1       Family History  Problem Relation Age of Onset   Cancer Mother        breast   Breast cancer Mother 8   Pancreatic cancer Mother        neg [employer] MCA genetic testing 2021   Breast cancer Maternal Aunt    Breast cancer Maternal Aunt      Blood pressure/Hypertension: BP Readings from Last 3 Encounters:  03/30/23 128/82  01/25/23 (!) 124/92  10/24/22 116/88    Weight/Obesity: Wt Readings from Last 3 Encounters:  03/30/23 130 lb 14.4 oz (59.4 kg)  01/25/23 128 lb (58.1 kg)  10/24/22 122 lb (55.3 kg)   BMI Readings from Last 3 Encounters:  03/30/23 20.35 kg/m  01/25/23 18.90 kg/m  10/24/22 18.02 kg/m     Lipids:  Lab Results  Component Value Date   CHOL 196 10/26/2021   CHOL 165 05/21/2018   CHOL 180 04/10/2017   Lab Results  Component Value Date   HDL 49 10/26/2021   HDL 55 05/21/2018   HDL 63 04/10/2017   Lab Results  Component Value Date   LDLCALC 121 (H) 10/26/2021   LDLCALC 91 05/21/2018   LDLCALC 93 04/10/2017   Lab Results  Component Value Date   TRIG 148 10/26/2021   TRIG 93 05/21/2018   TRIG 122 04/10/2017   Lab Results  Component Value Date   CHOLHDL 4.0 10/26/2021   CHOLHDL 2.9 04/10/2017   No results found for: "LDLDIRECT" Based on the results of lipid panel his/her cardiovascular risk factor ( using Poole Cohort )  in the next 10 years is: The 10-year ASCVD risk score (Arnett DK, et al., 2019) is: 10.5%   Values used to calculate the score:     Age: 44 years      Sex: Female     Is Non-Hispanic African American: Yes     Diabetic: No     Tobacco smoker:  Yes     Systolic Blood Pressure: 128 mmHg     Is BP treated: Yes     HDL Cholesterol: 49 mg/dL     Total Cholesterol: 196 mg/dL  Glucose:  Glucose, Bld  Date Value Ref Range Status  01/25/2023 90 70 - 99 mg/dL Final    Comment:    Glucose reference range applies only to samples taken after fasting for at least 8 hours.  10/10/2022 81 70 - 99 mg/dL Final    Comment:    Glucose reference range applies only to samples taken after fasting for at least 8 hours.  10/06/2022 85 70 - 99 mg/dL Final    Comment:    Glucose reference range applies only to samples taken after fasting for at least 8 hours.   Glucose-Capillary  Date Value Ref Range Status  10/27/2021 103 (H) 70 - 99 mg/dL Final    Comment:    Glucose reference range applies only to samples taken after fasting for at least 8 hours.  10/27/2021 94 70 - 99 mg/dL Final    Comment:    Glucose reference range applies only to samples taken after fasting for at least 8 hours.    Advanced Care Planning:  A voluntary discussion about advance care planning including the explanation and discussion of advance directives.   Discussed health care proxy and Living will, and the patient was able to identify a health care proxy as Joy Ray -  Patient unsure have a living will at present time.   Social History       Social History   Socioeconomic History   Marital status: Single    Spouse name: Not on file   Number of children: 1   Years of education: Not on file   Highest education level: Not on file  Occupational History   Not on file  Tobacco Use   Smoking status: Light Smoker    Packs/day: 0.25    Years: 20.00    Additional pack years: 0.00    Total pack years: 5.00    Types: Cigarettes   Smokeless tobacco: Never   Tobacco comments:    pt has cut back to 1 or 2 cigs/day  Vaping Use   Vaping Use: Never used  Substance  and Sexual Activity   Alcohol use: Yes    Comment: Rare   Drug use: No   Sexual activity: Not Currently  Other Topics Concern   Not on file  Social History Narrative   Not on file   Social Determinants of Health   Financial Resource Strain: Low Risk  (03/30/2023)   Overall Financial Resource Strain (CARDIA)    Difficulty of Paying Living Expenses: Not hard at all  Food Insecurity: No Food Insecurity (03/30/2023)   Hunger Vital Sign    Worried About Running Out of Food in the Last Year: Never true    Ran Out of Food in the Last Year: Never true  Transportation Needs: No Transportation Needs (03/30/2023)   PRAPARE - Administrator, Civil Service (Medical): No    Lack of Transportation (Non-Medical): No  Physical Activity: Inactive (03/30/2023)   Exercise Vital Sign    Days of Exercise per Week: 0 days    Minutes of Exercise per Session: 0 min  Stress: No Stress Concern Present (03/30/2023)   [employer] of Occupational Health - Occupational Stress Questionnaire    Feeling of Stress : Only a little  Social Connections: Moderately Integrated (03/30/2023)  Social Connection and Isolation Panel [NHANES]    Frequency of Communication with Friends and Family: More than three times a week    Frequency of Social Gatherings with Friends and Family: More than three times a week    Attends Religious Services: 1 to 4 times per year    Active Member of [employer] or Organizations: Yes    Attends Banker Meetings: Never    Marital Status: Never married    Family History        Family History  Problem Relation Age of Onset   Cancer Mother        breast   Breast cancer Mother 85   Pancreatic cancer Mother        neg [employer] MCA genetic testing 2021   Breast cancer Maternal Aunt    Breast cancer Maternal Aunt     Patient Active Problem List   Diagnosis Date Noted   Malignant neoplasm of upper-outer quadrant of right breast in female, estrogen receptor positive  09/14/2022   Goals of care, counseling/discussion 09/14/2022   History of tobacco abuse 06/26/2022   Awaiting organ transplant status 06/26/2022   ESRD on peritoneal dialysis 03/29/2022   Dysmenorrhea 03/29/2022   Protein-calorie malnutrition, severe 11/01/2021   Anemia due to folic acid deficiency    B12 deficiency    Thrombocytopenia    Anemia in chronic kidney disease, on chronic dialysis    Acute respiratory failure with hypoxia 10/26/2021   History of smoking 10-25 pack years 04/18/2018   Allergic rhinitis 10/13/2015   H/O abnormal cervical Papanicolaou smear 10/13/2015   Hypertension goal BP (blood pressure) < 140/90 10/13/2015    Past Surgical History:  Procedure Laterality Date   BREAST BIOPSY Right 09/07/2022   u/s bx coil clip --> pathology (+) for IMC (G1, ER/PR +, HER2/neu -)   CAPD INSERTION N/A 11/02/2021   Procedure: LAPAROSCOPIC INSERTION CONTINUOUS AMBULATORY PERITONEAL DIALYSIS  (CAPD) CATHETER;  Surgeon: Leafy Ro, MD;  Location: [facility];  Service: General;  Laterality: N/A;   COLONOSCOPY WITH PROPOFOL N/A 04/21/2016   Procedure: COLONOSCOPY WITH PROPOFOL;  Surgeon: Midge Minium, MD;  Location: [facility];  Service: Endoscopy;  Laterality: N/A;   INSERTION OF MESH  11/02/2021   Procedure: INSERTION OF MESH;  Surgeon: Leafy Ro, MD;  Location: [facility];  Service: General;;   SIMPLE MASTECTOMY WITH AXILLARY SENTINEL NODE BIOPSY Right 10/10/2022   Procedure: SIMPLE MASTECTOMY WITH AXILLARY SENTINEL NODE BIOPSY;  Surgeon: Earline Mayotte, MD;  Location: [facility];  Service: General;  Laterality: Right;   TUBAL LIGATION  12/19/1992   After [id number]     Current Outpatient Medications:    anastrozole (ARIMIDEX) 1 MG tablet, Take 1 tablet (1 mg total) by mouth daily., Disp: 30 tablet, Rfl: 3   calcium acetate, Phos Binder, (PHOSLYRA) 667 MG/5ML SOLN, Take by mouth 3 (three) times daily with meals., Disp: , Rfl:    fluticasone (FLONASE) 50 MCG/ACT  nasal spray, SHAKE LIQUID AND USE 2 SPRAYS IN EACH NOSTRIL EVERY DAY, Disp: 16 g, Rfl: 3   furosemide (LASIX) 80 MG tablet, Take 25 mg by mouth daily., Disp: , Rfl:    HYDROcodone-acetaminophen (NORCO/VICODIN) 5-325 MG tablet, 1/2 tablet every 4-6 hours if needed for pain., Disp: 15 tablet, Rfl: 0   potassium chloride SA (KLOR-CON M) 20 MEQ tablet, Take 20 mEq by mouth 2 (two) times daily., Disp: , Rfl:   Allergies  Allergen Reactions   Ciprofloxacin  Pt denies    [facility] Care Team[facility] [place] as [facility] - General (Family Medicine) Hulen LusterMoore, Ana K, RN as Oncology Nurse Navigator   Chart Review: I personally reviewed active problem list, medication list, allergies, family history, social history, health maintenance, notes from last encounter, lab results, imaging with the patient/caregiver today.   Review of Systems  Constitutional: Negative.  Negative for activity change, appetite change, fatigue and unexpected weight change.  HENT: Negative.    Eyes: Negative.   Respiratory: Negative.  Negative for shortness of breath and wheezing.   Cardiovascular: Negative.   Gastrointestinal: Negative.  Negative for abdominal pain and constipation.  Endocrine: Negative.   Genitourinary: Negative.  Negative for difficulty urinating, enuresis, frequency and menstrual problem.  Musculoskeletal: Negative.   Skin: Negative.  Negative for color change and pallor.  Allergic/Immunologic: Negative.   Neurological: Negative.  Negative for syncope, weakness and light-headedness.  Hematological: Negative.   Psychiatric/Behavioral: Negative.  Negative for behavioral problems, decreased concentration, dysphoric mood, self-injury, sleep disturbance and suicidal ideas. The patient is not nervous/anxious and is not hyperactive.   All other systems reviewed and are negative.         Objective:   Vitals:  Vitals:   03/30/23 1501  BP: 128/82  Pulse: 100  Resp: 16  Temp: 97.7 F (36.5  C)  TempSrc: Oral  SpO2: 98%  Weight: 130 lb 14.4 oz (59.4 kg)  Height: 5' 7.25" (1.708 m)    Body mass index is 20.35 kg/m.  Physical Exam Vitals and nursing note reviewed.  Constitutional:      General: She is not in acute distress.    Appearance: Normal appearance. She is well-developed. She is not ill-appearing, toxic-appearing or diaphoretic.     Interventions: Face mask in place.  HENT:     Head: Normocephalic and atraumatic.     Right Ear: External ear normal.     Left Ear: External ear normal.  Eyes:     General: Lids are normal. No scleral icterus.       Right eye: No discharge.        Left eye: No discharge.     Conjunctiva/sclera: Conjunctivae normal.  Neck:     Trachea: Phonation normal. No tracheal deviation.  Cardiovascular:     Rate and Rhythm: Normal rate and regular rhythm.     Pulses: Normal pulses.          Radial pulses are 2+ on the right side and 2+ on the left side.       Posterior tibial pulses are 2+ on the right side and 2+ on the left side.     Heart sounds: Normal heart sounds. No murmur heard.    No friction rub. No gallop.  Pulmonary:     Effort: Pulmonary effort is normal. No respiratory distress.     Breath sounds: Normal breath sounds. No stridor. No wheezing, rhonchi or rales.  Chest:     Chest wall: No tenderness.  Abdominal:     General: Bowel sounds are normal. There is no distension.     Palpations: Abdomen is soft.  Musculoskeletal:     Right lower leg: No edema.     Left lower leg: No edema.  Skin:    General: Skin is warm and dry.     Coloration: Skin is not jaundiced or pale.     Findings: No rash.  Neurological:     Mental Status: She is alert.     Motor:  No abnormal muscle tone.     Gait: Gait normal.  Psychiatric:        Mood and Affect: Mood normal.        Speech: Speech normal.        Behavior: Behavior normal.       Fall Risk:    03/30/2023    2:52 PM 05/27/2022    1:28 PM 03/28/2022    2:45 PM 10/26/2021     1:41 PM 03/24/2021    3:21 PM  Fall Risk   Falls in the past year? 0 0 0 0 0  Number falls in past yr: 0 0 0 0 0  Injury with Fall? 0 0 0 0 0  Risk for fall due to : No Fall Risks No Fall Risks No Fall Risks No Fall Risks   Follow up Falls prevention discussed;Education provided;Falls evaluation completed Falls prevention discussed;Education provided Education provided;Falls prevention discussed Falls prevention discussed Falls evaluation completed    Functional Status Survey: Is the patient deaf or have difficulty hearing?: No Does the patient have difficulty seeing, even when wearing glasses/contacts?: No Does the patient have difficulty concentrating, remembering, or making decisions?: No Does the patient have difficulty walking or climbing stairs?: No Does the patient have difficulty dressing or bathing?: No Does the patient have difficulty doing errands alone such as visiting a doctor's office or shopping?: No   Assessment & Plan:    CPE completed today  USPSTF grade A and B recommendations reviewed with patient; age-appropriate recommendations, preventive care, screening tests, etc discussed and encouraged; healthy living encouraged; see AVS for patient education given to patient  Discussed importance of 150 minutes of physical activity weekly, AHA exercise recommendations given to pt in AVS/handout  Discussed importance of healthy diet:  eating lean meats and proteins, avoiding trans fats and saturated fats, avoid simple sugars and excessive carbs in diet, eat 6 servings of fruit/vegetables daily and drink plenty of water and avoid sweet beverages.    Recommended pt to do annual eye exam and routine dental exams/cleanings  Depression, alcohol, fall screening completed as documented above and per flowsheets  Advance Care planning information and packet discussed and offered today, encouraged pt to discuss with family members/spouse/partner/friends and complete Advanced directive  packet and bring copy to office   Reviewed Health Maintenance: Health Maintenance  Topic Date Due   Medicare Annual Wellness (AWV)  Never done   DTaP/Tdap/Td (1 - Tdap) Never done   COVID-19 Vaccine (2 - Janssen risk series) 04/15/2023 (Originally 04/27/2020)   INFLUENZA VACCINE  07/20/2023   MAMMOGRAM  07/26/2023   PAP SMEAR-Modifier  11/11/2024   COLONOSCOPY (Pts 45-17yrs Insurance coverage will need to be confirmed)  04/21/2026   Hepatitis C Screening  Completed   HIV Screening  Completed   Zoster Vaccines- Shingrix  Completed   HPV VACCINES  Aged Out    Immunizations: Immunization History  Administered Date(s) Administered   Fluad Quad(high Dose 65+) 10/26/2021   Influenza,inj,Quad PF,6+ Mos 02/01/2016, 08/03/2019, 08/04/2019   Janssen (J&J) SARS-COV-2 Vaccination 03/30/2020   PNEUMOCOCCAL CONJUGATE-20 10/26/2021   Zoster Recombinat (Shingrix) 03/24/2021, 05/25/2021   Vaccines:  HPV: up to at age 65 , ask insurance if age between 60-45  Shingrix: 24-64 yo and ask insurance if covered when patient above 40 yo Pneumonia: done in 2022 educated and discussed with patient.    ICD-10-CM   1. Annual physical exam  Z00.00 COMPLETE METABOLIC PANEL WITH GFR    CBC with Differential/Platelet  Lipid panel    2. ESRD on peritoneal dialysis Chronic N18.6    Z99.2    process has gotten better, Dr. Thedore Mins, no recent labs available, on transplant list at [facility], making urine    3. Protein-calorie malnutrition, severe Chronic E43    improved, will resolve Dx, weight gained, BMI stable in normal range    4. Thrombocytopenia Chronic D69.6    recheck and monitor labs          Danelle Berry, Cordelia Poche 03/30/23 3:16 PM  [facility]

## 2023-03-31 ENCOUNTER — Encounter: Payer: Self-pay | Admitting: Family Medicine

## 2023-03-31 LAB — LIPID PANEL
Cholesterol: 215 mg/dL — ABNORMAL HIGH (ref <200)
HDL: 59 mg/dL (ref >=50)
LDL Cholesterol (Calc): 129 mg/dL (calc) — ABNORMAL HIGH
Non-HDL Cholesterol (Calc): 156 mg/dL (calc) — ABNORMAL HIGH (ref <130)
Total CHOL/HDL Ratio: 3.6 (calc) (ref <5.0)
Triglycerides: 153 mg/dL — ABNORMAL HIGH (ref <150)

## 2023-03-31 LAB — COMPLETE METABOLIC PANEL WITH GFR
AG Ratio: 1.4 (calc) (ref 1.0–2.5)
ALT: 5 U/L — ABNORMAL LOW (ref 6–29)
AST: 11 U/L (ref 10–35)
Albumin: 3.5 g/dL — ABNORMAL LOW (ref 3.6–5.1)
Alkaline phosphatase (APISO): 108 U/L (ref 37–153)
BUN/Creatinine Ratio: 8 (calc) (ref 6–22)
BUN: 27 mg/dL — ABNORMAL HIGH (ref 7–25)
CO2: 26 mmol/L (ref 20–32)
Calcium: 9.3 mg/dL (ref 8.6–10.4)
Chloride: 102 mmol/L (ref 98–110)
Creat: 3.59 mg/dL — ABNORMAL HIGH (ref 0.50–1.03)
Globulin: 2.5 g/dL (calc) (ref 1.9–3.7)
Glucose, Bld: 77 mg/dL (ref 65–99)
Potassium: 4.3 mmol/L (ref 3.5–5.3)
Sodium: 137 mmol/L (ref 135–146)
Total Bilirubin: 0.2 mg/dL (ref 0.2–1.2)
Total Protein: 6 g/dL — ABNORMAL LOW (ref 6.1–8.1)
eGFR: 14 mL/min/{1.73_m2} — ABNORMAL LOW (ref >=60)

## 2023-03-31 LAB — CBC WITH DIFFERENTIAL/PLATELET
Absolute Monocytes: 592 cells/uL (ref 200–950)
Basophils Absolute: 64 cells/uL (ref 0–200)
Basophils Relative: 0.8 %
Eosinophils Absolute: 208 cells/uL (ref 15–500)
Eosinophils Relative: 2.6 %
HCT: 39.8 % (ref 35.0–45.0)
Hemoglobin: 13 g/dL (ref 11.7–15.5)
Lymphs Abs: 3000 cells/uL (ref 850–3900)
MCH: 29.8 pg (ref 27.0–33.0)
MCHC: 32.7 g/dL (ref 32.0–36.0)
MCV: 91.3 fL (ref 80.0–100.0)
MPV: 10.3 fL (ref 7.5–12.5)
Monocytes Relative: 7.4 %
Neutro Abs: 4136 cells/uL (ref 1500–7800)
Neutrophils Relative %: 51.7 %
Platelets: 318 10*3/uL (ref 140–400)
RBC: 4.36 10*6/uL (ref 3.80–5.10)
RDW: 13 % (ref 11.0–15.0)
Total Lymphocyte: 37.5 %
WBC: 8 10*3/uL (ref 3.8–10.8)

## 2023-04-04 ENCOUNTER — Other Ambulatory Visit: Payer: Self-pay | Admitting: Family Medicine

## 2023-04-04 MED ORDER — ROSUVASTATIN CALCIUM 5 MG PO TABS: 5.0000 mg | ORAL_TABLET | Freq: Every day | ORAL | 3 refills | Status: DC

## 2023-04-18 DIAGNOSIS — Z992 Dependence on renal dialysis: Secondary | ICD-10-CM | POA: Diagnosis not present

## 2023-04-18 DIAGNOSIS — N186 End stage renal disease: Secondary | ICD-10-CM | POA: Diagnosis not present

## 2023-05-19 DIAGNOSIS — N186 End stage renal disease: Secondary | ICD-10-CM | POA: Diagnosis not present

## 2023-05-19 DIAGNOSIS — Z992 Dependence on renal dialysis: Secondary | ICD-10-CM | POA: Diagnosis not present

## 2023-06-18 DIAGNOSIS — Z992 Dependence on renal dialysis: Secondary | ICD-10-CM | POA: Diagnosis not present

## 2023-06-18 DIAGNOSIS — N186 End stage renal disease: Secondary | ICD-10-CM | POA: Diagnosis not present

## 2023-07-19 DIAGNOSIS — N186 End stage renal disease: Secondary | ICD-10-CM | POA: Diagnosis not present

## 2023-07-19 DIAGNOSIS — Z992 Dependence on renal dialysis: Secondary | ICD-10-CM | POA: Diagnosis not present

## 2023-07-31 ENCOUNTER — Other Ambulatory Visit: Payer: Self-pay

## 2023-07-31 ENCOUNTER — Emergency Department
Admission: EM | Admit: 2023-07-31 | Discharge: 2023-07-31 | Disposition: A | Discharge status: Home / Self Care | Payer: 59 | Attending: Emergency Medicine | Admitting: Emergency Medicine

## 2023-07-31 DIAGNOSIS — Z992 Dependence on renal dialysis: Secondary | ICD-10-CM | POA: Diagnosis not present

## 2023-07-31 DIAGNOSIS — E876 Hypokalemia: Secondary | ICD-10-CM | POA: Diagnosis not present

## 2023-07-31 DIAGNOSIS — N186 End stage renal disease: Secondary | ICD-10-CM | POA: Insufficient documentation

## 2023-07-31 LAB — COMPREHENSIVE METABOLIC PANEL
ALT: 13 U/L (ref 0–44)
AST: 16 U/L (ref 15–41)
Albumin: 3.2 g/dL — ABNORMAL LOW (ref 3.5–5.0)
Alkaline Phosphatase: 83 U/L (ref 38–126)
Anion gap: 9 (ref 5–15)
BUN: 35 mg/dL — ABNORMAL HIGH (ref 6–20)
CO2: 27 mmol/L (ref 22–32)
Calcium: 8.7 mg/dL — ABNORMAL LOW (ref 8.9–10.3)
Chloride: 99 mmol/L (ref 98–111)
Creatinine, Ser: 3.58 mg/dL — ABNORMAL HIGH (ref 0.44–1.00)
GFR, Estimated: 14 mL/min — ABNORMAL LOW (ref >60)
Glucose, Bld: 94 mg/dL (ref 70–99)
Potassium: 3.3 mmol/L — ABNORMAL LOW (ref 3.5–5.1)
Sodium: 135 mmol/L (ref 135–145)
Total Bilirubin: 0.7 mg/dL (ref 0.3–1.2)
Total Protein: 6.4 g/dL — ABNORMAL LOW (ref 6.5–8.1)

## 2023-07-31 LAB — CBC WITH DIFFERENTIAL/PLATELET
Abs Immature Granulocytes: 0.02 10*3/uL (ref 0.00–0.07)
Basophils Absolute: 0.1 10*3/uL (ref 0.0–0.1)
Basophils Relative: 1 %
Eosinophils Absolute: 0 10*3/uL (ref 0.0–0.5)
Eosinophils Relative: 0 %
HCT: 41.4 % (ref 36.0–46.0)
Hemoglobin: 13.6 g/dL (ref 12.0–15.0)
Immature Granulocytes: 0 %
Lymphocytes Relative: 29 %
Lymphs Abs: 2.1 10*3/uL (ref 0.7–4.0)
MCH: 30.8 pg (ref 26.0–34.0)
MCHC: 32.9 g/dL (ref 30.0–36.0)
MCV: 93.9 fL (ref 80.0–100.0)
Monocytes Absolute: 0.4 10*3/uL (ref 0.1–1.0)
Monocytes Relative: 6 %
Neutro Abs: 4.6 10*3/uL (ref 1.7–7.7)
Neutrophils Relative %: 64 %
Platelets: 258 10*3/uL (ref 150–400)
RBC: 4.41 MIL/uL (ref 3.87–5.11)
RDW: 13.9 % (ref 11.5–15.5)
WBC: 7.2 10*3/uL (ref 4.0–10.5)
nRBC: 0 % (ref 0.0–0.2)

## 2023-07-31 NOTE — ED Provider Notes (Signed)
[facility] Provider Note   Event Date/Time   First MD Initiated Contact with Patient 07/31/23 1603     (approximate) History  sent by dialysis for bloodwork, K  HPI Joy Ray is a 57 y.o. female with stated past medical history of end-stage renal disease newly started dialysis who presents from a primary care visit after being told that her potassium was low.  Patient does not know her potassium was told to present to the emergency department for further evaluation.  Patient denies any complaints and states that she has been following up at her nephrology appointments on time and as scheduled.  Patient denies any medication nonadherence. ROS: Patient currently denies any vision changes, tinnitus, difficulty speaking, facial droop, sore throat, chest pain, shortness of breath, abdominal pain, nausea/vomiting/diarrhea, dysuria, or weakness/numbness/paresthesias in any extremity   Physical Exam  Triage Vital Signs: ED Triage Vitals [07/31/23 1454]  Encounter Vitals Group     BP (!) 130/93     Systolic BP Percentile      Diastolic BP Percentile      Pulse Rate 73     Resp 19     Temp 97.7 F (36.5 C)     Temp Source Oral     SpO2 97 %     Weight 120 lb (54.4 kg)     Height 5\' 8"  (1.727 m)     Head Circumference      Peak Flow      Pain Score 0     Pain Loc      Pain Education      Exclude from Growth Chart    Most recent vital signs: Vitals:   07/31/23 1454 07/31/23 1650  BP: (!) 130/93 (!) 155/96  Pulse: 73   Resp: 19 18  Temp: 97.7 F (36.5 C)   SpO2: 97%    General: Awake, oriented x4. CV:  Good peripheral perfusion.  Resp:  Normal effort.  Abd:  No distention.  Other:  Middle-aged well-developed, well-nourished African-American female laying in bed in no acute distress ED Results / Procedures / Treatments  Labs (all labs ordered are listed, but only abnormal results are displayed) Labs Reviewed  COMPREHENSIVE METABOLIC PANEL  - Abnormal; Notable for the following components:      Result Value   Potassium 3.3 (*)    BUN 35 (*)    Creatinine, Ser 3.58 (*)    Calcium 8.7 (*)    Total Protein 6.4 (*)    Albumin 3.2 (*)    GFR, Estimated 14 (*)    All other components within normal limits  CBC WITH DIFFERENTIAL/PLATELET   EKG ED ECG REPORT I, Merwyn Katos, the attending physician, personally viewed and interpreted this ECG. Date: 07/31/2023 EKG Time: 1648 Rate: 65 Rhythm: normal sinus rhythm QRS Axis: normal Intervals: normal ST/T Wave abnormalities: normal Narrative Interpretation: no evidence of acute ischemia PROCEDURES: Critical Care performed: No Procedures MEDICATIONS ORDERED IN ED: Medications - No data to display IMPRESSION / MDM / ASSESSMENT AND PLAN / ED COURSE  I reviewed the triage vital signs and the nursing notes.                             The patient is on the cardiac monitor to evaluate for evidence of arrhythmia and/or significant heart rate changes Patient's presentation is most consistent with acute presentation with potential threat to life or bodily function. Patient  57 year old female who presents from dialysis/PCP for low potassium.  Patient's potassium is 3.3.  Patient states that she has been going to all of her dialysis appointments on time and as scheduled.  Patient is taking her medications on time and as prescribed.  Patient has no complaints or symptoms at this time.  EKG shows no concerning signs of hypokalemia.  Patient needs no acute intervention at this time and is stable for discharge and follow-up with her nephrologist for any continued management of her electrolytes.  Dispo: Discharge home with PCP follow-up   FINAL CLINICAL IMPRESSION(S) / ED DIAGNOSES   Final diagnoses:  Hypokalemia   Rx / DC Orders   ED Discharge Orders     None      Note:  This document was prepared using Dragon voice recognition software and may include unintentional dictation  errors.   Merwyn Katos, MD 08/01/23 [phone]

## 2023-07-31 NOTE — ED Triage Notes (Signed)
Pt arrives via POV, sent by dialysis for bloodwork. Pt states potassium is either low or high she doesn't remember.

## 2023-08-02 ENCOUNTER — Encounter: Payer: Self-pay | Admitting: Oncology

## 2023-08-02 ENCOUNTER — Inpatient Hospital Stay: Payer: 59 | Attending: Oncology | Admitting: Oncology

## 2023-08-02 ENCOUNTER — Inpatient Hospital Stay: Payer: 59

## 2023-08-02 VITALS — BP 112/98 | HR 94 | Temp 96.8°F | Resp 18 | Ht 68.0 in | Wt 127.7 lb

## 2023-08-02 DIAGNOSIS — Z9011 Acquired absence of right breast and nipple: Secondary | ICD-10-CM | POA: Diagnosis not present

## 2023-08-02 DIAGNOSIS — Z992 Dependence on renal dialysis: Secondary | ICD-10-CM | POA: Diagnosis not present

## 2023-08-02 DIAGNOSIS — Z08 Encounter for follow-up examination after completed treatment for malignant neoplasm: Secondary | ICD-10-CM

## 2023-08-02 DIAGNOSIS — C50911 Malignant neoplasm of unspecified site of right female breast: Secondary | ICD-10-CM | POA: Diagnosis not present

## 2023-08-02 DIAGNOSIS — F17210 Nicotine dependence, cigarettes, uncomplicated: Secondary | ICD-10-CM | POA: Diagnosis not present

## 2023-08-02 DIAGNOSIS — Z170 Estrogen receptor positive status [ER+]: Secondary | ICD-10-CM | POA: Insufficient documentation

## 2023-08-02 DIAGNOSIS — Z79899 Other long term (current) drug therapy: Secondary | ICD-10-CM | POA: Insufficient documentation

## 2023-08-02 DIAGNOSIS — Z5181 Encounter for therapeutic drug level monitoring: Secondary | ICD-10-CM

## 2023-08-02 DIAGNOSIS — Z79811 Long term (current) use of aromatase inhibitors: Secondary | ICD-10-CM | POA: Diagnosis not present

## 2023-08-02 NOTE — Progress Notes (Signed)
Survivorship Care Plan visit completed.  Treatment summary reviewed and given to patient.  ASCO answers booklet reviewed and given to patient.  CARE program and Cancer Transitions discussed with patient along with other resources cancer center offers to patients and caregivers.  Patient verbalized understanding.    

## 2023-08-03 LAB — FSH/LH
FSH: 199 m[IU]/mL
LH: 89.7 m[IU]/mL

## 2023-08-03 LAB — ESTRADIOL: Estradiol: 6.6 pg/mL

## 2023-08-03 NOTE — Progress Notes (Signed)
Hematology/Oncology Consult note [facility]  Telephone:([phone] [phone]  Patient Care Team: [facility], [place] as PCP - General (Family Medicine) Hulen Luster, RN as Oncology Nurse Navigator Creig Hines, MD as Consulting Physician (Oncology) Sung Amabile, DO as Consulting Physician (General Surgery)   Name of the patient: Joy Ray  [id number]  July 27, 1966   Date of visit: 08/03/23  Diagnosis- Pathologic prognostic stage Ia invasive mammary carcinoma of the right breast pT1 aN0 M0 ER/PR positive HER2 negative s/p right mastectomy    Chief complaint/ Reason for visit-routine follow-up of breast cancer  Heme/Onc history: patient is a 57 year old female who underwent a bilateral screening mammogram in August 2023 which showed a possible distortion in the right breast.  This was followed by a diagnostic mammogram and ultrasound which showed a 3 x 3 x 3 mm irregular hypoechoic mass in the right breast.  No suspicious right axillary adenopathy.  This was biopsied and was consistent with a 5 mm invasive mammary carcinoma grade 1 ER greater than 90% positive PR greater than 90% positive and HER2 negative.   Patient is currently on home peritoneal dialysis 7 days a week.  Her mother was also diagnosed with breast cancer and had a negative genetic testing.  She was on progesterone injections in the past and has not had any return of cycles after stopping those injections.    Patient underwent a right mastectomy with Dr. Lemar Livings in October 2023 as she did not want to take any adjuvant radiation.  Final pathology showed 5 mm grade 1 invasive mammary carcinoma with negative margins.  5 sentinel lymph nodes negative for malignancy she did not require any adjuvant chemotherapy.  She was started on Arimidex in November 2023.  Baseline bone density scan normal.    Interval history-patient is doing well overall and denies any specific complaints  at this time.  Denies any breast concerns.  ECOG PS- 1 Pain scale- 0   Review of systems- Review of Systems  Constitutional:  Negative for chills, fever, malaise/fatigue and weight loss.  HENT:  Negative for congestion, ear discharge and nosebleeds.   Eyes:  Negative for blurred vision.  Respiratory:  Negative for cough, hemoptysis, sputum production, shortness of breath and wheezing.   Cardiovascular:  Negative for chest pain, palpitations, orthopnea and claudication.  Gastrointestinal:  Negative for abdominal pain, blood in stool, constipation, diarrhea, heartburn, melena, nausea and vomiting.  Genitourinary:  Negative for dysuria, flank pain, frequency, hematuria and urgency.  Musculoskeletal:  Negative for back pain, joint pain and myalgias.  Skin:  Negative for rash.  Neurological:  Negative for dizziness, tingling, focal weakness, seizures, weakness and headaches.  Endo/Heme/Allergies:  Does not bruise/bleed easily.  Psychiatric/Behavioral:  Negative for depression and suicidal ideas. The patient does not have insomnia.       Allergies  Allergen Reactions   Ciprofloxacin     Pt denies     Past Medical History:  Diagnosis Date   Acute kidney injury (HCC)    Allergy    Cigarette smoker    HTN, goal below 140/90    Malignant neoplasm of upper-outer quadrant of right breast in female, estrogen receptor positive (HCC) 09/07/2022   a.) CNB 09/07/2022 --> pathology (+) for IMC (G1, ER/PR +, HER2/neu -)   Second degree hemorrhoids      Past Surgical History:  Procedure Laterality Date   BREAST BIOPSY Right 09/07/2022   u/s bx coil clip --> pathology (+)  for [facility] (G1, ER/PR +, HER2/neu -)   CAPD INSERTION N/A 11/02/2021   Procedure: LAPAROSCOPIC INSERTION CONTINUOUS AMBULATORY PERITONEAL DIALYSIS  (CAPD) CATHETER;  Surgeon: Leafy Ro, MD;  Location: [facility] ORS;  Service: General;  Laterality: N/A;   COLONOSCOPY WITH PROPOFOL N/A 04/21/2016   Procedure: COLONOSCOPY WITH  PROPOFOL;  Surgeon: Midge Minium, MD;  Location: [facility];  Service: Endoscopy;  Laterality: N/A;   INSERTION OF MESH  11/02/2021   Procedure: INSERTION OF MESH;  Surgeon: Leafy Ro, MD;  Location: [facility] ORS;  Service: General;;   SIMPLE MASTECTOMY WITH AXILLARY SENTINEL NODE BIOPSY Right 10/10/2022   Procedure: SIMPLE MASTECTOMY WITH AXILLARY SENTINEL NODE BIOPSY;  Surgeon: Earline Mayotte, MD;  Location: [facility] ORS;  Service: General;  Laterality: Right;   TUBAL LIGATION  12/19/1992   After G1P1001    Social History   Socioeconomic History   Marital status: Single    Spouse name: Not on file   Number of children: 1   Years of education: Not on file   Highest education level: Not on file  Occupational History   Not on file  Tobacco Use   Smoking status: Light Smoker    Current packs/day: 0.25    Average packs/day: 0.3 packs/day for 20.0 years (5.0 ttl pk-yrs)    Types: Cigarettes   Smokeless tobacco: Never   Tobacco comments:    pt has cut back to 1 or 2 cigs/day  Vaping Use   Vaping status: Never Used  Substance and Sexual Activity   Alcohol use: Yes    Comment: Rare   Drug use: No   Sexual activity: Not Currently  Other Topics Concern   Not on file  Social History Narrative   Not on file   Social Determinants of Health   Financial Resource Strain: Low Risk  (03/30/2023)   Overall Financial Resource Strain (CARDIA)    Difficulty of Paying Living Expenses: Not hard at all  Food Insecurity: No Food Insecurity (03/30/2023)   Hunger Vital Sign    Worried About Running Out of Food in the Last Year: Never true    Ran Out of Food in the Last Year: Never true  Transportation Needs: No Transportation Needs (03/30/2023)   PRAPARE - Administrator, Civil Service (Medical): No    Lack of Transportation (Non-Medical): No  Physical Activity: Inactive (03/30/2023)   Exercise Vital Sign    Days of Exercise per Week: 0 days    Minutes of Exercise per  Session: 0 min  Stress: No Stress Concern Present (03/30/2023)   [employer] of Occupational Health - Occupational Stress Questionnaire    Feeling of Stress : Only a little  Social Connections: Moderately Integrated (03/30/2023)   Social Connection and Isolation Panel [NHANES]    Frequency of Communication with Friends and Family: More than three times a week    Frequency of Social Gatherings with Friends and Family: More than three times a week    Attends Religious Services: 1 to 4 times per year    Active Member of [employer] or Organizations: Yes    Attends Banker Meetings: Never    Marital Status: Never married  Intimate Partner Violence: Not At Risk (03/30/2023)   Humiliation, Afraid, Rape, and Kick questionnaire    Fear of Current or Ex-Partner: No    Emotionally Abused: No    Physically Abused: No    Sexually Abused: No    Family History  Problem Relation  Age of Onset   Cancer Mother        breast   Breast cancer Mother 70   Pancreatic cancer Mother        neg [employer] MCA genetic testing 2021   Breast cancer Maternal Aunt    Breast cancer Maternal Aunt      Current Outpatient Medications:    anastrozole (ARIMIDEX) 1 MG tablet, Take 1 tablet (1 mg total) by mouth daily., Disp: 30 tablet, Rfl: 3   calcium acetate, Phos Binder, (PHOSLYRA) 667 MG/5ML SOLN, Take by mouth 3 (three) times daily with meals., Disp: , Rfl:    fluticasone (FLONASE) 50 MCG/ACT nasal spray, SHAKE LIQUID AND USE 2 SPRAYS IN EACH NOSTRIL EVERY DAY, Disp: 16 g, Rfl: 3   furosemide (LASIX) 80 MG tablet, Take 25 mg by mouth daily., Disp: , Rfl:    potassium chloride SA (KLOR-CON M) 20 MEQ tablet, Take 20 mEq by mouth 2 (two) times daily., Disp: , Rfl:    rosuvastatin (CRESTOR) 5 MG tablet, Take 1 tablet (5 mg total) by mouth daily., Disp: 90 tablet, Rfl: 3   HYDROcodone-acetaminophen (NORCO/VICODIN) 5-325 MG tablet, 1/2 tablet every 4-6 hours if needed for pain. (Patient not taking: Reported  on 08/02/2023), Disp: 15 tablet, Rfl: 0  Physical exam:  Vitals:   08/02/23 1355  BP: (!) 112/98  Pulse: 94  Resp: 18  Temp: (!) 96.8 F (36 C)  TempSrc: Tympanic  SpO2: 99%  Weight: 127 lb 11.2 oz (57.9 kg)  Height: 5\' 8"  (1.727 m)   Physical Exam Cardiovascular:     Rate and Rhythm: Normal rate and regular rhythm.     Heart sounds: Normal heart sounds.  Pulmonary:     Effort: Pulmonary effort is normal.     Breath sounds: Normal breath sounds.  Abdominal:     General: Bowel sounds are normal.     Palpations: Abdomen is soft.     Comments: Peritoneal dialysis catheter in place  Skin:    General: Skin is warm and dry.  Neurological:     Mental Status: She is alert and oriented to person, place, and time.         Latest Ref Rng & Units 07/31/2023    2:57 PM  CMP  Glucose 70 - 99 mg/dL 94   BUN 6 - 20 mg/dL 35   Creatinine 4.09 - 1.00 mg/dL 8.11   Sodium 914 - 782 mmol/L 135   Potassium 3.5 - 5.1 mmol/L 3.3   Chloride 98 - 111 mmol/L 99   CO2 22 - 32 mmol/L 27   Calcium 8.9 - 10.3 mg/dL 8.7   Total Protein 6.5 - 8.1 g/dL 6.4   Total Bilirubin 0.3 - 1.2 mg/dL 0.7   Alkaline Phos 38 - 126 U/L 83   AST 15 - 41 U/L 16   ALT 0 - 44 U/L 13       Latest Ref Rng & Units 07/31/2023    2:57 PM  CBC  WBC 4.0 - 10.5 K/uL 7.2   Hemoglobin 12.0 - 15.0 g/dL 95.6   Hematocrit 21.3 - 46.0 % 41.4   Platelets 150 - 400 K/uL 258     Assessment and plan- Patient is a 57 y.o. female with history of right breast cancer stage I ER/PR positive HER2 negative s/p right mastectomy and currently on Arimidex here for routine follow-up  Clinically patient is doing well with no concerning signs and symptoms of recurrence based on today's exam.  She would be due for a left breast mammogram in September 2024 which I will schedule.  Patient needs to take Arimidex for 5 years ending in November 2028.  I will see her back in 6 months no labs.  Hormone levels were checked today and were  consistent with postmenopausal range.    Visit Diagnosis 1. Encounter for follow-up surveillance of breast cancer   2. S/P right mastectomy   3. Visit for monitoring Arimidex therapy      Dr. Owens Shark, MD, MPH [facility] [phone] 08/03/2023 2:45 PM

## 2023-08-08 ENCOUNTER — Telehealth: Payer: Self-pay

## 2023-08-08 NOTE — Telephone Encounter (Signed)
Transition Care Management Unsuccessful Follow-up Telephone Call  Date of discharge and from where:  [place] /12  Attempts:  2nd Attempt  Reason for unsuccessful TCM follow-up call:  No answer/busy   Lenard Forth [facility] Guide, [facility] [phone] [address] [place], [place], [place] [address] Phone: [phone] Email: [email or web address]

## 2023-08-08 NOTE — Telephone Encounter (Signed)
Transition Care Management Unsuccessful Follow-up Telephone Call  Date of discharge and from where:  [place] 8/12  Attempts:  1st Attempt  Reason for unsuccessful TCM follow-up call:  No answer/busy   Lenard Forth [facility] Guide, [facility] [phone] [address] [place], [place], [place] [address] Phone: [phone] Email: [email or web address]

## 2023-08-19 DIAGNOSIS — Z992 Dependence on renal dialysis: Secondary | ICD-10-CM | POA: Diagnosis not present

## 2023-08-19 DIAGNOSIS — N186 End stage renal disease: Secondary | ICD-10-CM | POA: Diagnosis not present

## 2023-08-24 DIAGNOSIS — H524 Presbyopia: Secondary | ICD-10-CM | POA: Diagnosis not present

## 2023-08-24 DIAGNOSIS — Z0100 Encounter for examination of eyes and vision without abnormal findings: Secondary | ICD-10-CM | POA: Diagnosis not present

## 2023-08-29 ENCOUNTER — Other Ambulatory Visit: Payer: Self-pay | Admitting: Oncology

## 2023-08-29 ENCOUNTER — Encounter: Payer: Self-pay | Admitting: Radiology

## 2023-08-29 ENCOUNTER — Ambulatory Visit
Admission: RE | Admit: 2023-08-29 | Discharge: 2023-08-29 | Disposition: A | Discharge status: Home / Self Care | Payer: 59 | Source: Ambulatory Visit | Attending: Oncology | Admitting: Oncology

## 2023-08-29 DIAGNOSIS — Z1231 Encounter for screening mammogram for malignant neoplasm of breast: Secondary | ICD-10-CM | POA: Diagnosis not present

## 2023-08-29 DIAGNOSIS — Z08 Encounter for follow-up examination after completed treatment for malignant neoplasm: Secondary | ICD-10-CM | POA: Insufficient documentation

## 2023-08-29 DIAGNOSIS — Z9011 Acquired absence of right breast and nipple: Secondary | ICD-10-CM | POA: Diagnosis not present

## 2023-08-29 DIAGNOSIS — Z853 Personal history of malignant neoplasm of breast: Secondary | ICD-10-CM | POA: Diagnosis not present

## 2023-08-29 DIAGNOSIS — Z5181 Encounter for therapeutic drug level monitoring: Secondary | ICD-10-CM

## 2023-09-18 DIAGNOSIS — Z992 Dependence on renal dialysis: Secondary | ICD-10-CM | POA: Diagnosis not present

## 2023-09-18 DIAGNOSIS — N186 End stage renal disease: Secondary | ICD-10-CM | POA: Diagnosis not present

## 2023-09-20 ENCOUNTER — Other Ambulatory Visit: Payer: Self-pay | Admitting: *Deleted

## 2023-09-20 MED ORDER — ANASTROZOLE 1 MG PO TABS: 1.0000 mg | ORAL_TABLET | Freq: Every day | ORAL | 4 refills | Status: DC

## 2023-09-20 NOTE — Progress Notes (Signed)
[facility]

## 2023-10-16 ENCOUNTER — Other Ambulatory Visit: Payer: Self-pay | Admitting: *Deleted

## 2023-10-16 MED ORDER — ANASTROZOLE 1 MG PO TABS: 1.0000 mg | ORAL_TABLET | Freq: Every day | ORAL | 0 refills | Status: DC

## 2023-10-19 DIAGNOSIS — N186 End stage renal disease: Secondary | ICD-10-CM | POA: Diagnosis not present

## 2023-10-19 DIAGNOSIS — Z992 Dependence on renal dialysis: Secondary | ICD-10-CM | POA: Diagnosis not present

## 2023-10-24 ENCOUNTER — Inpatient Hospital Stay: Payer: 59 | Attending: Oncology

## 2023-10-24 ENCOUNTER — Encounter: Payer: Self-pay | Admitting: Oncology

## 2023-10-24 ENCOUNTER — Telehealth: Payer: Self-pay | Admitting: *Deleted

## 2023-10-24 ENCOUNTER — Inpatient Hospital Stay: Payer: 59 | Attending: Oncology | Admitting: Oncology

## 2023-10-24 VITALS — BP 107/86 | HR 73 | Temp 98.0°F | Resp 18 | Wt 119.1 lb

## 2023-10-24 DIAGNOSIS — Z992 Dependence on renal dialysis: Secondary | ICD-10-CM | POA: Insufficient documentation

## 2023-10-24 DIAGNOSIS — F17210 Nicotine dependence, cigarettes, uncomplicated: Secondary | ICD-10-CM | POA: Diagnosis not present

## 2023-10-24 DIAGNOSIS — Z170 Estrogen receptor positive status [ER+]: Secondary | ICD-10-CM | POA: Diagnosis not present

## 2023-10-24 DIAGNOSIS — D751 Secondary polycythemia: Secondary | ICD-10-CM | POA: Diagnosis not present

## 2023-10-24 DIAGNOSIS — Z79811 Long term (current) use of aromatase inhibitors: Secondary | ICD-10-CM | POA: Insufficient documentation

## 2023-10-24 DIAGNOSIS — D582 Other hemoglobinopathies: Secondary | ICD-10-CM

## 2023-10-24 DIAGNOSIS — Z9011 Acquired absence of right breast and nipple: Secondary | ICD-10-CM | POA: Diagnosis not present

## 2023-10-24 DIAGNOSIS — Z79899 Other long term (current) drug therapy: Secondary | ICD-10-CM | POA: Diagnosis not present

## 2023-10-24 DIAGNOSIS — Z08 Encounter for follow-up examination after completed treatment for malignant neoplasm: Secondary | ICD-10-CM

## 2023-10-24 DIAGNOSIS — Z853 Personal history of malignant neoplasm of breast: Secondary | ICD-10-CM | POA: Diagnosis not present

## 2023-10-24 DIAGNOSIS — C50911 Malignant neoplasm of unspecified site of right female breast: Secondary | ICD-10-CM | POA: Diagnosis present

## 2023-10-24 LAB — CBC WITH DIFFERENTIAL/PLATELET
Abs Immature Granulocytes: 0 10*3/uL (ref 0.00–0.07)
Basophils Absolute: 0 10*3/uL (ref 0.0–0.1)
Basophils Relative: 0 %
Eosinophils Absolute: 0 10*3/uL (ref 0.0–0.5)
Eosinophils Relative: 0 %
HCT: 42.9 % (ref 36.0–46.0)
Hemoglobin: 14.4 g/dL (ref 12.0–15.0)
Lymphocytes Relative: 39 %
Lymphs Abs: 2.9 10*3/uL (ref 0.7–4.0)
MCH: 31 pg (ref 26.0–34.0)
MCHC: 33.6 g/dL (ref 30.0–36.0)
MCV: 92.5 fL (ref 80.0–100.0)
Monocytes Absolute: 0.2 10*3/uL (ref 0.1–1.0)
Monocytes Relative: 3 %
Neutro Abs: 4.4 10*3/uL (ref 1.7–7.7)
Neutrophils Relative %: 58 %
Platelets: 270 10*3/uL (ref 150–400)
RBC: 4.64 MIL/uL (ref 3.87–5.11)
RDW: 13.5 % (ref 11.5–15.5)
Smear Review: NORMAL
WBC: 7.5 10*3/uL (ref 4.0–10.5)
nRBC: 0 % (ref 0.0–0.2)

## 2023-10-24 NOTE — Progress Notes (Signed)
Hematology/Oncology Consult note [facility]  [phone] [phone]  Patient Care Team: [facility], [place] as PCP - General (Family Medicine) Hulen Luster, RN as Oncology Nurse Navigator Creig Hines, MD as Consulting Physician (Oncology) Sung Amabile, DO as Consulting Physician (General Surgery)   Name of the patient: Joy Ray  [id number]  1966-06-12   Date of visit: 10/24/23  Diagnosis-  Pathologic prognostic stage Ia invasive mammary carcinoma of the right breast pT1 aN0 M0 ER/PR positive HER2 negative s/p right mastectomy    Chief complaint/ Reason for visit-routine follow-up of breast cancer and follow-up of polycythemia  Heme/Onc history: patient is a 57 year old female who underwent a bilateral screening mammogram in August 2023 which showed a possible distortion in the right breast.  This was followed by a diagnostic mammogram and ultrasound which showed a 3 x 3 x 3 mm irregular hypoechoic mass in the right breast.  No suspicious right axillary adenopathy.  This was biopsied and was consistent with a 5 mm invasive mammary carcinoma grade 1 ER greater than 90% positive PR greater than 90% positive and HER2 negative.   Patient is currently on home peritoneal dialysis 7 days a week.  Her mother was also diagnosed with breast cancer and had a negative genetic testing.  She was on progesterone injections in the past and has not had any return of cycles after stopping those injections.    Patient underwent a right mastectomy with Dr. Lemar Livings in October 2023 as she did not want to take any adjuvant radiation.  Final pathology showed 5 mm grade 1 invasive mammary carcinoma with negative margins.  5 sentinel lymph nodes negative for malignancy she did not require any adjuvant chemotherapy.  She was started on Arimidex in November 2023.  Baseline bone density scan normal.      Interval history-tolerating Arimidex well  without any significant side effects.  She was noted to have a high hemoglobin when hemoglobin was checked with nephrology.  She is doing well with home dialysis  ECOG PS- 0 Pain scale- 0 She has  Review of systems- Review of Systems  Constitutional:  Negative for chills, fever, malaise/fatigue and weight loss.  HENT:  Negative for congestion, ear discharge and nosebleeds.   Eyes:  Negative for blurred vision.  Respiratory:  Negative for cough, hemoptysis, sputum production, shortness of breath and wheezing.   Cardiovascular:  Negative for chest pain, palpitations, orthopnea and claudication.  Gastrointestinal:  Negative for abdominal pain, blood in stool, constipation, diarrhea, heartburn, melena, nausea and vomiting.  Genitourinary:  Negative for dysuria, flank pain, frequency, hematuria and urgency.  Musculoskeletal:  Negative for back pain, joint pain and myalgias.  Skin:  Negative for rash.  Neurological:  Negative for dizziness, tingling, focal weakness, seizures, weakness and headaches.  Endo/Heme/Allergies:  Does not bruise/bleed easily.  Psychiatric/Behavioral:  Negative for depression and suicidal ideas. The patient does not have insomnia.       Allergies  Allergen Reactions   Ciprofloxacin     Pt denies     Past Medical History:  Diagnosis Date   Acute kidney injury (HCC)    Allergy    Cigarette smoker    HTN, goal below 140/90    Malignant neoplasm of upper-outer quadrant of right breast in female, estrogen receptor positive (HCC) 09/07/2022   a.) CNB 09/07/2022 --> pathology (+) for IMC (G1, ER/PR +, HER2/neu -)   Second degree hemorrhoids      Past  Surgical History:  Procedure Laterality Date   BREAST BIOPSY Right 09/07/2022   u/s bx coil clip --> pathology (+) for IMC (G1, ER/PR +, HER2/neu -)   CAPD INSERTION N/A 11/02/2021   Procedure: LAPAROSCOPIC INSERTION CONTINUOUS AMBULATORY PERITONEAL DIALYSIS  (CAPD) CATHETER;  Surgeon: Leafy Ro, MD;   Location: [facility] ORS;  Service: General;  Laterality: N/A;   COLONOSCOPY WITH PROPOFOL N/A 04/21/2016   Procedure: COLONOSCOPY WITH PROPOFOL;  Surgeon: Midge Minium, MD;  Location: [facility];  Service: Endoscopy;  Laterality: N/A;   INSERTION OF MESH  11/02/2021   Procedure: INSERTION OF MESH;  Surgeon: Leafy Ro, MD;  Location: [facility] ORS;  Service: General;;   MASTECTOMY     SIMPLE MASTECTOMY WITH AXILLARY SENTINEL NODE BIOPSY Right 10/10/2022   Procedure: SIMPLE MASTECTOMY WITH AXILLARY SENTINEL NODE BIOPSY;  Surgeon: Earline Mayotte, MD;  Location: [facility] ORS;  Service: General;  Laterality: Right;   TUBAL LIGATION  12/19/1992   After G1P1001    Social History   Socioeconomic History   Marital status: Single    Spouse name: Not on file   Number of children: 1   Years of education: Not on file   Highest education level: Not on file  Occupational History   Not on file  Tobacco Use   Smoking status: Light Smoker    Current packs/day: 0.25    Average packs/day: 0.3 packs/day for 20.0 years (5.0 ttl pk-yrs)    Types: Cigarettes   Smokeless tobacco: Never   Tobacco comments:    pt has cut back to 1 or 2 cigs/day  Vaping Use   Vaping status: Never Used  Substance and Sexual Activity   Alcohol use: Yes    Comment: Rare   Drug use: No   Sexual activity: Not Currently  Other Topics Concern   Not on file  Social History Narrative   Not on file   Social Determinants of Health   Financial Resource Strain: Low Risk  (03/30/2023)   Overall Financial Resource Strain (CARDIA)    Difficulty of Paying Living Expenses: Not hard at all  Food Insecurity: No Food Insecurity (03/30/2023)   Hunger Vital Sign    Worried About Running Out of Food in the Last Year: Never true    Ran Out of Food in the Last Year: Never true  Transportation Needs: No Transportation Needs (03/30/2023)   PRAPARE - Administrator, Civil Service (Medical): No    Lack of Transportation  (Non-Medical): No  Physical Activity: Inactive (03/30/2023)   Exercise Vital Sign    Days of Exercise per Week: 0 days    Minutes of Exercise per Session: 0 min  Stress: No Stress Concern Present (03/30/2023)   [employer] of Occupational Health - Occupational Stress Questionnaire    Feeling of Stress : Only a little  Social Connections: Moderately Integrated (03/30/2023)   Social Connection and Isolation Panel [NHANES]    Frequency of Communication with Friends and Family: More than three times a week    Frequency of Social Gatherings with Friends and Family: More than three times a week    Attends Religious Services: 1 to 4 times per year    Active Member of [employer] or Organizations: Yes    Attends Banker Meetings: Never    Marital Status: Never married  Intimate Partner Violence: Not At Risk (03/30/2023)   Humiliation, Afraid, Rape, and Kick questionnaire    Fear of Current or Ex-Partner: No  Emotionally Abused: No    Physically Abused: No    Sexually Abused: No    Family History  Problem Relation Age of Onset   Cancer Mother        breast   Breast cancer Mother 39   Pancreatic cancer Mother        neg [employer] MCA genetic testing 2021   Breast cancer Maternal Aunt    Breast cancer Maternal Aunt      Current Outpatient Medications:    gentamicin cream (GARAMYCIN) 0.1 %, Apply topically daily., Disp: , Rfl:    anastrozole (ARIMIDEX) 1 MG tablet, Take 1 tablet (1 mg total) by mouth daily., Disp: 90 tablet, Rfl: 0   calcium acetate, Phos Binder, (PHOSLYRA) 667 MG/5ML SOLN, Take by mouth 3 (three) times daily with meals., Disp: , Rfl:    fluticasone (FLONASE) 50 MCG/ACT nasal spray, SHAKE LIQUID AND USE 2 SPRAYS IN EACH NOSTRIL EVERY DAY, Disp: 16 g, Rfl: 3   furosemide (LASIX) 80 MG tablet, Take 25 mg by mouth daily., Disp: , Rfl:    HYDROcodone-acetaminophen (NORCO/VICODIN) 5-325 MG tablet, 1/2 tablet every 4-6 hours if needed for pain. (Patient not taking:  Reported on 08/02/2023), Disp: 15 tablet, Rfl: 0   potassium chloride SA (KLOR-CON M) 20 MEQ tablet, Take 20 mEq by mouth 2 (two) times daily., Disp: , Rfl:    rosuvastatin (CRESTOR) 5 MG tablet, Take 1 tablet (5 mg total) by mouth daily., Disp: 90 tablet, Rfl: 3  Physical exam:  Vitals:   10/24/23 1524  BP: 107/86  Pulse: 73  Resp: 18  Temp: 98 F (36.7 C)  TempSrc: Tympanic  SpO2: 98%  Weight: 119 lb 1.6 oz (54 kg)   Physical Exam Cardiovascular:     Rate and Rhythm: Normal rate and regular rhythm.     Heart sounds: Normal heart sounds.  Pulmonary:     Effort: Pulmonary effort is normal.     Breath sounds: Normal breath sounds.  Abdominal:     General: Bowel sounds are normal.     Palpations: Abdomen is soft.     Comments: Peritoneal dialysis catheter in place  Skin:    General: Skin is warm and dry.  Neurological:     Mental Status: She is alert and oriented to person, place, and time.   Breast exam: Patient is s/p left mastectomy without reconstruction and no evidence of chest wall recurrence.  No palpable masses in the left breast.  No palpable bilateral axillary adenopathy.     Latest Ref Rng & Units 07/31/2023    2:57 PM  CMP  Glucose 70 - 99 mg/dL 94   BUN 6 - 20 mg/dL 35   Creatinine 1.61 - 1.00 mg/dL 0.96   Sodium 045 - 409 mmol/L 135   Potassium 3.5 - 5.1 mmol/L 3.3   Chloride 98 - 111 mmol/L 99   CO2 22 - 32 mmol/L 27   Calcium 8.9 - 10.3 mg/dL 8.7   Total Protein 6.5 - 8.1 g/dL 6.4   Total Bilirubin 0.3 - 1.2 mg/dL 0.7   Alkaline Phos 38 - 126 U/L 83   AST 15 - 41 U/L 16   ALT 0 - 44 U/L 13       Latest Ref Rng & Units 10/24/2023    4:09 PM  CBC  WBC 4.0 - 10.5 K/uL 7.5   Hemoglobin 12.0 - 15.0 g/dL 81.1   Hematocrit 91.4 - 46.0 % 42.9   Platelets 150 - 400  K/uL 270     No images are attached to the encounter.  No results found.   Assessment and plan- Patient is a 57 y.o. female with history of right breast cancer stage I ER/PR positive  HER2 negative s/p right mastectomy and currently on Arimidex.  She is here for following issues:  Surveillance breast cancer: Doing well on Arimidex which she will continue with calcium and vitamin D and I will see her back in 6 months.  She would be due for a mammogram in January 2025 which I will schedule.  Polycythemia: Patient was notedTo have a high hemoglobin in the nephrology office although I do not have that value with me today.  I am repeating her CBC today and if it is not elevated she does not require any further workup for the same   Visit Diagnosis 1. High blood hemoglobin F (HCC)      Dr. Owens Shark, MD, MPH [facility] at [facility] [phone] 10/24/2023 4:36 PM

## 2023-10-24 NOTE — Telephone Encounter (Addendum)
Hemoglobin was 14.4. that is a normal number for hemoglobin and Dr, Smith Robert says that you do not  need any work up for the hemoglobin. Will need 6 months appt with no labs for the breast cancer that she sees your for. I also told her that scheduler will make the 6 month appt tom. Pt ok with this.

## 2023-11-18 DIAGNOSIS — Z992 Dependence on renal dialysis: Secondary | ICD-10-CM | POA: Diagnosis not present

## 2023-11-18 DIAGNOSIS — N186 End stage renal disease: Secondary | ICD-10-CM | POA: Diagnosis not present

## 2023-12-20 DIAGNOSIS — N186 End stage renal disease: Secondary | ICD-10-CM | POA: Diagnosis not present

## 2023-12-20 DIAGNOSIS — Z992 Dependence on renal dialysis: Secondary | ICD-10-CM | POA: Diagnosis not present

## 2023-12-21 DIAGNOSIS — Z992 Dependence on renal dialysis: Secondary | ICD-10-CM | POA: Diagnosis not present

## 2023-12-21 DIAGNOSIS — N186 End stage renal disease: Secondary | ICD-10-CM | POA: Diagnosis not present

## 2023-12-22 DIAGNOSIS — N186 End stage renal disease: Secondary | ICD-10-CM | POA: Diagnosis not present

## 2023-12-22 DIAGNOSIS — Z992 Dependence on renal dialysis: Secondary | ICD-10-CM | POA: Diagnosis not present

## 2023-12-23 DIAGNOSIS — Z992 Dependence on renal dialysis: Secondary | ICD-10-CM | POA: Diagnosis not present

## 2023-12-23 DIAGNOSIS — N186 End stage renal disease: Secondary | ICD-10-CM | POA: Diagnosis not present

## 2023-12-25 DIAGNOSIS — N186 End stage renal disease: Secondary | ICD-10-CM | POA: Diagnosis not present

## 2023-12-25 DIAGNOSIS — Z992 Dependence on renal dialysis: Secondary | ICD-10-CM | POA: Diagnosis not present

## 2023-12-26 DIAGNOSIS — N186 End stage renal disease: Secondary | ICD-10-CM | POA: Diagnosis not present

## 2023-12-26 DIAGNOSIS — Z992 Dependence on renal dialysis: Secondary | ICD-10-CM | POA: Diagnosis not present

## 2023-12-27 DIAGNOSIS — Z992 Dependence on renal dialysis: Secondary | ICD-10-CM | POA: Diagnosis not present

## 2023-12-27 DIAGNOSIS — N186 End stage renal disease: Secondary | ICD-10-CM | POA: Diagnosis not present

## 2023-12-28 DIAGNOSIS — Z992 Dependence on renal dialysis: Secondary | ICD-10-CM | POA: Diagnosis not present

## 2023-12-28 DIAGNOSIS — N186 End stage renal disease: Secondary | ICD-10-CM | POA: Diagnosis not present

## 2023-12-29 DIAGNOSIS — Z992 Dependence on renal dialysis: Secondary | ICD-10-CM | POA: Diagnosis not present

## 2023-12-29 DIAGNOSIS — N186 End stage renal disease: Secondary | ICD-10-CM | POA: Diagnosis not present

## 2023-12-30 DIAGNOSIS — N186 End stage renal disease: Secondary | ICD-10-CM | POA: Diagnosis not present

## 2023-12-30 DIAGNOSIS — Z992 Dependence on renal dialysis: Secondary | ICD-10-CM | POA: Diagnosis not present

## 2024-01-01 DIAGNOSIS — Z992 Dependence on renal dialysis: Secondary | ICD-10-CM | POA: Diagnosis not present

## 2024-01-01 DIAGNOSIS — N186 End stage renal disease: Secondary | ICD-10-CM | POA: Diagnosis not present

## 2024-01-02 DIAGNOSIS — Z992 Dependence on renal dialysis: Secondary | ICD-10-CM | POA: Diagnosis not present

## 2024-01-02 DIAGNOSIS — N186 End stage renal disease: Secondary | ICD-10-CM | POA: Diagnosis not present

## 2024-01-03 DIAGNOSIS — Z992 Dependence on renal dialysis: Secondary | ICD-10-CM | POA: Diagnosis not present

## 2024-01-03 DIAGNOSIS — N186 End stage renal disease: Secondary | ICD-10-CM | POA: Diagnosis not present

## 2024-01-04 DIAGNOSIS — N186 End stage renal disease: Secondary | ICD-10-CM | POA: Diagnosis not present

## 2024-01-04 DIAGNOSIS — Z992 Dependence on renal dialysis: Secondary | ICD-10-CM | POA: Diagnosis not present

## 2024-01-05 DIAGNOSIS — Z992 Dependence on renal dialysis: Secondary | ICD-10-CM | POA: Diagnosis not present

## 2024-01-05 DIAGNOSIS — N186 End stage renal disease: Secondary | ICD-10-CM | POA: Diagnosis not present

## 2024-01-06 DIAGNOSIS — N186 End stage renal disease: Secondary | ICD-10-CM | POA: Diagnosis not present

## 2024-01-06 DIAGNOSIS — Z992 Dependence on renal dialysis: Secondary | ICD-10-CM | POA: Diagnosis not present

## 2024-01-08 DIAGNOSIS — Z992 Dependence on renal dialysis: Secondary | ICD-10-CM | POA: Diagnosis not present

## 2024-01-08 DIAGNOSIS — N186 End stage renal disease: Secondary | ICD-10-CM | POA: Diagnosis not present

## 2024-01-09 DIAGNOSIS — Z992 Dependence on renal dialysis: Secondary | ICD-10-CM | POA: Diagnosis not present

## 2024-01-09 DIAGNOSIS — N186 End stage renal disease: Secondary | ICD-10-CM | POA: Diagnosis not present

## 2024-01-10 DIAGNOSIS — N186 End stage renal disease: Secondary | ICD-10-CM | POA: Diagnosis not present

## 2024-01-10 DIAGNOSIS — Z992 Dependence on renal dialysis: Secondary | ICD-10-CM | POA: Diagnosis not present

## 2024-01-11 DIAGNOSIS — Z992 Dependence on renal dialysis: Secondary | ICD-10-CM | POA: Diagnosis not present

## 2024-01-11 DIAGNOSIS — N186 End stage renal disease: Secondary | ICD-10-CM | POA: Diagnosis not present

## 2024-01-12 DIAGNOSIS — Z992 Dependence on renal dialysis: Secondary | ICD-10-CM | POA: Diagnosis not present

## 2024-01-12 DIAGNOSIS — N186 End stage renal disease: Secondary | ICD-10-CM | POA: Diagnosis not present

## 2024-01-13 DIAGNOSIS — Z992 Dependence on renal dialysis: Secondary | ICD-10-CM | POA: Diagnosis not present

## 2024-01-13 DIAGNOSIS — N186 End stage renal disease: Secondary | ICD-10-CM | POA: Diagnosis not present

## 2024-01-15 DIAGNOSIS — Z992 Dependence on renal dialysis: Secondary | ICD-10-CM | POA: Diagnosis not present

## 2024-01-15 DIAGNOSIS — N186 End stage renal disease: Secondary | ICD-10-CM | POA: Diagnosis not present

## 2024-01-16 DIAGNOSIS — N186 End stage renal disease: Secondary | ICD-10-CM | POA: Diagnosis not present

## 2024-01-16 DIAGNOSIS — Z992 Dependence on renal dialysis: Secondary | ICD-10-CM | POA: Diagnosis not present

## 2024-01-17 DIAGNOSIS — N186 End stage renal disease: Secondary | ICD-10-CM | POA: Diagnosis not present

## 2024-01-17 DIAGNOSIS — Z992 Dependence on renal dialysis: Secondary | ICD-10-CM | POA: Diagnosis not present

## 2024-01-18 DIAGNOSIS — Z992 Dependence on renal dialysis: Secondary | ICD-10-CM | POA: Diagnosis not present

## 2024-01-18 DIAGNOSIS — N186 End stage renal disease: Secondary | ICD-10-CM | POA: Diagnosis not present

## 2024-01-19 DIAGNOSIS — Z992 Dependence on renal dialysis: Secondary | ICD-10-CM | POA: Diagnosis not present

## 2024-01-19 DIAGNOSIS — N186 End stage renal disease: Secondary | ICD-10-CM | POA: Diagnosis not present

## 2024-01-20 DIAGNOSIS — N186 End stage renal disease: Secondary | ICD-10-CM | POA: Diagnosis not present

## 2024-01-20 DIAGNOSIS — Z992 Dependence on renal dialysis: Secondary | ICD-10-CM | POA: Diagnosis not present

## 2024-01-22 DIAGNOSIS — N186 End stage renal disease: Secondary | ICD-10-CM | POA: Diagnosis not present

## 2024-01-22 DIAGNOSIS — Z992 Dependence on renal dialysis: Secondary | ICD-10-CM | POA: Diagnosis not present

## 2024-01-23 DIAGNOSIS — Z992 Dependence on renal dialysis: Secondary | ICD-10-CM | POA: Diagnosis not present

## 2024-01-23 DIAGNOSIS — N186 End stage renal disease: Secondary | ICD-10-CM | POA: Diagnosis not present

## 2024-01-24 DIAGNOSIS — Z992 Dependence on renal dialysis: Secondary | ICD-10-CM | POA: Diagnosis not present

## 2024-01-24 DIAGNOSIS — N186 End stage renal disease: Secondary | ICD-10-CM | POA: Diagnosis not present

## 2024-01-25 DIAGNOSIS — N186 End stage renal disease: Secondary | ICD-10-CM | POA: Diagnosis not present

## 2024-01-25 DIAGNOSIS — Z992 Dependence on renal dialysis: Secondary | ICD-10-CM | POA: Diagnosis not present

## 2024-01-26 DIAGNOSIS — N186 End stage renal disease: Secondary | ICD-10-CM | POA: Diagnosis not present

## 2024-01-26 DIAGNOSIS — Z992 Dependence on renal dialysis: Secondary | ICD-10-CM | POA: Diagnosis not present

## 2024-01-27 DIAGNOSIS — Z992 Dependence on renal dialysis: Secondary | ICD-10-CM | POA: Diagnosis not present

## 2024-01-27 DIAGNOSIS — N186 End stage renal disease: Secondary | ICD-10-CM | POA: Diagnosis not present

## 2024-01-29 DIAGNOSIS — Z992 Dependence on renal dialysis: Secondary | ICD-10-CM | POA: Diagnosis not present

## 2024-01-29 DIAGNOSIS — N186 End stage renal disease: Secondary | ICD-10-CM | POA: Diagnosis not present

## 2024-01-30 DIAGNOSIS — Z992 Dependence on renal dialysis: Secondary | ICD-10-CM | POA: Diagnosis not present

## 2024-01-30 DIAGNOSIS — N186 End stage renal disease: Secondary | ICD-10-CM | POA: Diagnosis not present

## 2024-01-31 DIAGNOSIS — Z992 Dependence on renal dialysis: Secondary | ICD-10-CM | POA: Diagnosis not present

## 2024-01-31 DIAGNOSIS — N186 End stage renal disease: Secondary | ICD-10-CM | POA: Diagnosis not present

## 2024-02-01 DIAGNOSIS — N186 End stage renal disease: Secondary | ICD-10-CM | POA: Diagnosis not present

## 2024-02-01 DIAGNOSIS — Z992 Dependence on renal dialysis: Secondary | ICD-10-CM | POA: Diagnosis not present

## 2024-02-02 DIAGNOSIS — N186 End stage renal disease: Secondary | ICD-10-CM | POA: Diagnosis not present

## 2024-02-02 DIAGNOSIS — Z992 Dependence on renal dialysis: Secondary | ICD-10-CM | POA: Diagnosis not present

## 2024-02-05 ENCOUNTER — Ambulatory Visit: Payer: 59 | Admitting: Oncology

## 2024-02-07 ENCOUNTER — Telehealth: Payer: Self-pay | Admitting: *Deleted

## 2024-02-07 MED ORDER — ANASTROZOLE 1 MG PO TABS: 1.0000 mg | ORAL_TABLET | Freq: Every day | ORAL | 0 refills | Status: DC

## 2024-02-07 NOTE — Telephone Encounter (Signed)
She asked for refill and to [facility]  and I sent the order to [facility]

## 2024-02-09 ENCOUNTER — Other Ambulatory Visit: Payer: Self-pay

## 2024-02-09 DIAGNOSIS — N186 End stage renal disease: Secondary | ICD-10-CM

## 2024-02-12 ENCOUNTER — Telehealth: Payer: Self-pay | Admitting: *Deleted

## 2024-02-12 NOTE — Progress Notes (Unsigned)
 Complex Care Management Note Care Guide Note  02/12/2024 Name: Joy Ray MRN: [id number] DOB: 05/19/1966   Complex Care Management Outreach Attempts: An unsuccessful telephone outreach was attempted today to offer the patient information about available complex care management services.  Follow Up Plan:  Additional outreach attempts will be made to offer the patient complex care management information and services.   Encounter Outcome:  No Answer  Burman Nieves, CMA, Care Guide [facility]  [facility], [facility] Guide Direct Dial: [phone]  Fax: [phone] Website: [email or web address]

## 2024-02-13 NOTE — Progress Notes (Signed)
 Complex Care Management Note  Care Guide Note 02/13/2024 Name: Joy Ray MRN: [id number] DOB: [birth year]  Joy Ray is a 58 y.o. year old female who sees [facility], [place] for primary care. I reached out to [employer] by phone today to offer complex care management services.  Ms. Sawaya was given information about Complex Care Management services today including:   The Complex Care Management services include support from the care team which includes your Nurse Care Manager, Clinical Social Worker, or Pharmacist.  The Complex Care Management team is here to help remove barriers to the health concerns and goals most important to you. Complex Care Management services are voluntary, and the patient may decline or stop services at any time by request to their care team member.   Complex Care Management Consent Status: Patient agreed to services and verbal consent obtained.   Follow up plan:  Telephone appointment with complex care management team member scheduled for:  02/20/2024  Encounter Outcome:  Patient Scheduled  Burman Nieves, CMA, Care Guide [facility]  [facility], [facility] Guide Direct Dial: [phone]  Fax: [phone] Website: [email or web address]

## 2024-02-13 NOTE — Progress Notes (Signed)
 Complex Care Management Note Care Guide Note  02/13/2024 Name: Joy Ray MRN: [id number] DOB: 19-Apr-1966   Complex Care Management Outreach Attempts: A second unsuccessful outreach was attempted today to offer the patient with information about available complex care management services.  Follow Up Plan:  Additional outreach attempts will be made to offer the patient complex care management information and services.   Encounter Outcome:  No Answer  Burman Nieves, CMA, Care Guide [facility]  [facility], [facility] Guide Direct Dial: [phone]  Fax: [phone] Website: [email or web address]

## 2024-02-16 DIAGNOSIS — Z992 Dependence on renal dialysis: Secondary | ICD-10-CM | POA: Diagnosis not present

## 2024-02-16 DIAGNOSIS — N186 End stage renal disease: Secondary | ICD-10-CM | POA: Diagnosis not present

## 2024-02-20 ENCOUNTER — Ambulatory Visit: Payer: Self-pay | Admitting: *Deleted

## 2024-02-20 NOTE — Patient Instructions (Signed)
 Visit Information  Thank you for taking time to visit with me today. Please don't hesitate to contact me if I can be of assistance to you before our next scheduled telephone appointment.  Please call the Suicide and Crisis Lifeline: 988 call the [place] National Suicide Prevention Lifeline: 989-435-3553 or TTY: (704)048-4237 TTY 406-495-6922) to talk to a trained counselor call 1-800-273-TALK (toll free, 24 hour hotline) call 911 if you are experiencing a Mental Health or Behavioral Health Crisis or need someone to talk to.  The patient verbalized understanding of instructions, educational materials, and care plan provided today and agreed to receive a mailed copy of patient instructions, educational materials, and care plan.   The patient has been provided with contact information for the care management team and has been advised to call with any health related questions or concerns.   Rodney Langton, RN, MSN, CCM [facility], [facility] RN Care Coordinator Direct Dial: [phone] / Main [phone] Fax [phone] Email: [email or web address]: [email or web address]

## 2024-02-20 NOTE — Patient Outreach (Signed)
 Care Coordination   Initial Visit Note   02/20/2024 Name: Joy Ray MRN: [id number] DOB: 07-01-1966  Joy Ray is a 58 y.o. year old female who sees [facility] [place], [place] for primary care. I spoke with  Joy Ray by phone today.  What matters to the patients health and wellness today?  Patient report she is doing well, compliant with dialysis, does not feel further case management calls are needed, but will contact this RNCM with questions.     Goals Addressed             This Visit's Progress    COMPLETED: Care Coordination Activities - no follow up needed       Interventions Today    Flowsheet Row Most Recent Value  Chronic Disease   Chronic disease during today's visit Hypertension (HTN), Chronic Kidney Disease/End Stage Renal Disease (ESRD)  General Interventions   General Interventions Discussed/Reviewed General Interventions Reviewed, Doctor Visits  [Performs PD at home, but checks in with HD center twice a month]  Doctor Visits Discussed/Reviewed Doctor Visits Reviewed, PCP, Specialist  [Upcoming with nephrology next week and PCP on 4/16]  PCP/Specialist Visits Compliance with follow-up visit  Education Interventions   Education Provided Provided Education  Provided Verbal Education On Nutrition, Medication, When to see the doctor  [Medications reviewed, report compliance.  Encouraged monitoring of fluid levels and blood pressure often]  Nutrition Interventions   Nutrition Discussed/Reviewed Nutrition Reviewed, Fluid intake, Increasing proteins, Adding fruits and vegetables, Decreasing salt  [Denies need for further education on diet]  Advanced Directive Interventions   Advanced Directives Discussed/Reviewed Advanced Directives Reviewed              SDOH assessments and interventions completed:  Yes  SDOH Interventions Today    Flowsheet Row Most Recent Value  SDOH Interventions   Food Insecurity Interventions  Intervention Not Indicated  Housing Interventions Intervention Not Indicated  Transportation Interventions Intervention Not Indicated  Utilities Interventions Intervention Not Indicated        Care Coordination Interventions:  Yes, provided   Follow up plan: No further intervention required.   Encounter Outcome:  No Answer   Joy Langton, RN, MSN, CCM [place]  [facility], [facility] RN Care Coordinator Direct Dial: [phone] / Main [phone] Fax [phone] Email: [email or web address] Website: [email or web address]

## 2024-03-13 ENCOUNTER — Other Ambulatory Visit: Payer: Self-pay | Admitting: Oncology

## 2024-03-21 ENCOUNTER — Other Ambulatory Visit: Payer: Self-pay

## 2024-03-21 MED ORDER — ANASTROZOLE 1 MG PO TABS: 1.0000 mg | ORAL_TABLET | Freq: Every day | ORAL | 0 refills | Status: AC

## 2024-04-01 ENCOUNTER — Encounter: Payer: 59 | Admitting: Family Medicine

## 2024-04-03 ENCOUNTER — Ambulatory Visit (INDEPENDENT_AMBULATORY_CARE_PROVIDER_SITE_OTHER): Payer: Self-pay | Admitting: Family Medicine

## 2024-04-03 ENCOUNTER — Encounter: Payer: Self-pay | Admitting: Family Medicine

## 2024-04-03 NOTE — Progress Notes (Signed)
    Patient: Joy Ray, Female    DOB: 04/15/1966, 58 y.o.   MRN: [id number] [facility], [place] Visit Date: 04/03/2024  Today's Provider: Adeline Hone, PA-C   No chief complaint on file. Pt no show/late cancel/reschedule    Adeline Hone, PA-C 04/03/24 11:50 AM  [facility]

## 2024-04-07 ENCOUNTER — Other Ambulatory Visit: Payer: Self-pay | Admitting: Family Medicine

## 2024-04-08 NOTE — Telephone Encounter (Signed)
 Requested medications are due for refill today.  yes  Requested medications are on the active medications list. yes  Last refill. 04/04/2023 #90 3rf  Future visit scheduled.   yes  Notes to clinic.  Labs are expired.    Requested Prescriptions  Pending Prescriptions Disp Refills   rosuvastatin  (CRESTOR ) 5 MG tablet [Pharmacy Med Name: Rosuvastatin  Calcium  5 MG Oral Tablet] 90 tablet 3    Sig: TAKE 1 TABLET BY MOUTH DAILY     Cardiovascular:  Antilipid - Statins 2 Failed - 04/08/2024  3:44 PM      Failed - Cr in normal range and within 360 days    Creat  Date Value Ref Range Status  03/30/2023 3.59 (H) 0.50 - 1.03 mg/dL Final   Creatinine, Ser  Date Value Ref Range Status  07/31/2023 3.58 (H) 0.44 - 1.00 mg/dL Final         Failed - Valid encounter within last 12 months    Recent Outpatient Visits           5 days ago No-show for appointment   [facility] Adeline Hone, PA-C       Future Appointments             In 2 weeks Tapia, Leisa, PA-C [facility]            Failed - Lipid Panel in normal range within the last 12 months    Cholesterol, Total  Date Value Ref Range Status  10/26/2021 196 100 - 199 mg/dL Final   Cholesterol  Date Value Ref Range Status  03/30/2023 215 (H) <200 mg/dL Final   LDL Cholesterol (Calc)  Date Value Ref Range Status  03/30/2023 129 (H) mg/dL (calc) Final    Comment:    Reference range: <100 . Desirable range <100 mg/dL for primary prevention;   <70 mg/dL for patients with CHD or diabetic patients  with > or = 2 CHD risk factors. Aaron Aas LDL-C is now calculated using the Martin-Hopkins  calculation, which is a validated novel method providing  better accuracy than the Friedewald equation in the  estimation of LDL-C.  Melinda Sprawls et al. Erroll Heard. 1610;960(45): 2061-2068  (http://education.[employer].com/faq/FAQ164)    HDL  Date Value Ref Range Status  03/30/2023 59 >  OR = 50 mg/dL Final  40/98/1191 49 >47 mg/dL Final   Triglycerides  Date Value Ref Range Status  03/30/2023 153 (H) <150 mg/dL Final         Passed - Patient is not pregnant

## 2024-04-23 ENCOUNTER — Other Ambulatory Visit (HOSPITAL_COMMUNITY)
Admission: RE | Admit: 2024-04-23 | Discharge: 2024-04-23 | Disposition: A | Discharge status: Home / Self Care | Source: Ambulatory Visit | Attending: Family Medicine | Admitting: Family Medicine

## 2024-04-23 ENCOUNTER — Ambulatory Visit (INDEPENDENT_AMBULATORY_CARE_PROVIDER_SITE_OTHER): Admitting: Family Medicine

## 2024-04-23 ENCOUNTER — Encounter: Payer: Self-pay | Admitting: Family Medicine

## 2024-04-23 ENCOUNTER — Telehealth: Payer: Self-pay | Admitting: Family Medicine

## 2024-04-23 ENCOUNTER — Inpatient Hospital Stay: Payer: 59 | Attending: Oncology | Admitting: Oncology

## 2024-04-23 ENCOUNTER — Encounter: Payer: Self-pay | Admitting: Oncology

## 2024-04-23 VITALS — BP 129/103 | HR 85 | Temp 97.8°F | Wt 120.0 lb

## 2024-04-23 VITALS — BP 118/70 | HR 81 | Resp 16 | Ht 68.0 in | Wt 120.0 lb

## 2024-04-23 DIAGNOSIS — Z170 Estrogen receptor positive status [ER+]: Secondary | ICD-10-CM | POA: Diagnosis not present

## 2024-04-23 DIAGNOSIS — Z01419 Encounter for gynecological examination (general) (routine) without abnormal findings: Secondary | ICD-10-CM | POA: Insufficient documentation

## 2024-04-23 DIAGNOSIS — Z79899 Other long term (current) drug therapy: Secondary | ICD-10-CM | POA: Diagnosis not present

## 2024-04-23 DIAGNOSIS — Z800 Family history of malignant neoplasm of digestive organs: Secondary | ICD-10-CM | POA: Insufficient documentation

## 2024-04-23 DIAGNOSIS — Z5181 Encounter for therapeutic drug level monitoring: Secondary | ICD-10-CM

## 2024-04-23 DIAGNOSIS — F17210 Nicotine dependence, cigarettes, uncomplicated: Secondary | ICD-10-CM | POA: Diagnosis not present

## 2024-04-23 DIAGNOSIS — L299 Pruritus, unspecified: Secondary | ICD-10-CM

## 2024-04-23 DIAGNOSIS — Z124 Encounter for screening for malignant neoplasm of cervix: Secondary | ICD-10-CM

## 2024-04-23 DIAGNOSIS — Z1151 Encounter for screening for human papillomavirus (HPV): Secondary | ICD-10-CM | POA: Insufficient documentation

## 2024-04-23 DIAGNOSIS — Z853 Personal history of malignant neoplasm of breast: Secondary | ICD-10-CM | POA: Diagnosis not present

## 2024-04-23 DIAGNOSIS — Z9011 Acquired absence of right breast and nipple: Secondary | ICD-10-CM | POA: Diagnosis not present

## 2024-04-23 DIAGNOSIS — E782 Mixed hyperlipidemia: Secondary | ICD-10-CM | POA: Diagnosis not present

## 2024-04-23 DIAGNOSIS — Z780 Asymptomatic menopausal state: Secondary | ICD-10-CM | POA: Insufficient documentation

## 2024-04-23 DIAGNOSIS — C50411 Malignant neoplasm of upper-outer quadrant of right female breast: Secondary | ICD-10-CM | POA: Diagnosis not present

## 2024-04-23 DIAGNOSIS — R5383 Other fatigue: Secondary | ICD-10-CM | POA: Insufficient documentation

## 2024-04-23 DIAGNOSIS — Z79811 Long term (current) use of aromatase inhibitors: Secondary | ICD-10-CM | POA: Diagnosis not present

## 2024-04-23 DIAGNOSIS — Z0001 Encounter for general adult medical examination with abnormal findings: Secondary | ICD-10-CM

## 2024-04-23 DIAGNOSIS — Z992 Dependence on renal dialysis: Secondary | ICD-10-CM | POA: Diagnosis not present

## 2024-04-23 DIAGNOSIS — Z803 Family history of malignant neoplasm of breast: Secondary | ICD-10-CM | POA: Insufficient documentation

## 2024-04-23 DIAGNOSIS — Z0000 Encounter for general adult medical examination without abnormal findings: Secondary | ICD-10-CM

## 2024-04-23 DIAGNOSIS — C50911 Malignant neoplasm of unspecified site of right female breast: Secondary | ICD-10-CM | POA: Diagnosis present

## 2024-04-23 DIAGNOSIS — Z08 Encounter for follow-up examination after completed treatment for malignant neoplasm: Secondary | ICD-10-CM

## 2024-04-23 MED ORDER — NYSTATIN-TRIAMCINOLONE 100000-0.1 UNIT/GM-% EX OINT: 1.0000 | TOPICAL_OINTMENT | Freq: Two times a day (BID) | CUTANEOUS | 2 refills | Status: AC | PRN

## 2024-04-23 MED ORDER — ROSUVASTATIN CALCIUM 5 MG PO TABS: 5.0000 mg | ORAL_TABLET | Freq: Every day | ORAL | 3 refills | Status: AC

## 2024-04-23 NOTE — Telephone Encounter (Signed)
 Please call to scheduled medicare wellness visit

## 2024-04-23 NOTE — Patient Instructions (Addendum)
 Health Maintenance  Topic Date Due   Medicare Annual Wellness Visit  Never done   COVID-19 Vaccine (2 - Janssen risk series) 05/09/2024*   DTaP/Tdap/Td vaccine (2 - Tdap) 04/23/2025*   Flu Shot  07/19/2024   Mammogram  08/28/2024   Pap with HPV screening  11/11/2024   Colon Cancer Screening  04/21/2026   Pneumococcal Vaccination  Completed   Hepatitis C Screening  Completed   HIV Screening  Completed   Zoster (Shingles) Vaccine  Completed   HPV Vaccine  Aged Out   Meningitis B Vaccine  Aged Out  *Topic was postponed. The date shown is not the original due date.   Preventive Care 10-1 Years Old, Female Preventive care refers to lifestyle choices and visits with your health care provider that can promote health and wellness. Preventive care visits are also called wellness exams. What can I expect for my preventive care visit? Counseling Your health care provider may ask you questions about your: Medical history, including: Past medical problems. Family medical history. Pregnancy history. Current health, including: Menstrual cycle. Method of birth control. Emotional well-being. Home life and relationship well-being. Sexual activity and sexual health. Lifestyle, including: Alcohol, nicotine or tobacco, and drug use. Access to firearms. Diet, exercise, and sleep habits. Work and work Astronomer. Sunscreen use. Safety issues such as seatbelt and bike helmet use. Physical exam Your health care provider will check your: Height and weight. These may be used to calculate your BMI (body mass index). BMI is a measurement that tells if you are at a healthy weight. Waist circumference. This measures the distance around your waistline. This measurement also tells if you are at a healthy weight and may help predict your risk of certain diseases, such as type 2 diabetes and high blood pressure. Heart rate and blood pressure. Body temperature. Skin for abnormal spots. What immunizations  do I need?  Vaccines are usually given at various ages, according to a schedule. Your health care provider will recommend vaccines for you based on your age, medical history, and lifestyle or other factors, such as travel or where you work. What tests do I need? Screening Your health care provider may recommend screening tests for certain conditions. This may include: Lipid and cholesterol levels. Diabetes screening. This is done by checking your blood sugar (glucose) after you have not eaten for a while (fasting). Pelvic exam and Pap test. Hepatitis B test. Hepatitis C test. HIV (human immunodeficiency virus) test. STI (sexually transmitted infection) testing, if you are at risk. Lung cancer screening. Colorectal cancer screening. Mammogram. Talk with your health care provider about when you should start having regular mammograms. This may depend on whether you have a family history of breast cancer. BRCA-related cancer screening. This may be done if you have a family history of breast, ovarian, tubal, or peritoneal cancers. Bone density scan. This is done to screen for osteoporosis. Talk with your health care provider about your test results, treatment options, and if necessary, the need for more tests. Follow these instructions at home: Eating and drinking  Eat a diet that includes fresh fruits and vegetables, whole grains, lean protein, and low-fat dairy products. Take vitamin and mineral supplements as recommended by your health care provider. Do not drink alcohol if: Your health care provider tells you not to drink. You are pregnant, may be pregnant, or are planning to become pregnant. If you drink alcohol: Limit how much you have to 0-1 drink a day. Know how much alcohol is in  your drink. In the [place] one drink equals one 12 oz bottle of beer (355 mL), one 5 oz glass of wine (148 mL), or one 1 oz glass of hard liquor (44 mL). Lifestyle Brush your teeth every morning and night  with fluoride toothpaste. Floss one time each day. Exercise for at least 30 minutes 5 or more days each week. Do not use any products that contain nicotine or tobacco. These products include cigarettes, chewing tobacco, and vaping devices, such as e-cigarettes. If you need help quitting, ask your health care provider. Do not use drugs. If you are sexually active, practice safe sex. Use a condom or other form of protection to prevent STIs. If you do not wish to become pregnant, use a form of birth control. If you plan to become pregnant, see your health care provider for a prepregnancy visit. Take aspirin only as told by your health care provider. Make sure that you understand how much to take and what form to take. Work with your health care provider to find out whether it is safe and beneficial for you to take aspirin daily. Find healthy ways to manage stress, such as: Meditation, yoga, or listening to music. Journaling. Talking to a trusted person. Spending time with friends and family. Minimize exposure to UV radiation to reduce your risk of skin cancer. Safety Always wear your seat belt while driving or riding in a vehicle. Do not drive: If you have been drinking alcohol. Do not ride with someone who has been drinking. When you are tired or distracted. While texting. If you have been using any mind-altering substances or drugs. Wear a helmet and other protective equipment during sports activities. If you have firearms in your house, make sure you follow all gun safety procedures. Seek help if you have been physically or sexually abused. What's next? Visit your health care provider once a year for an annual wellness visit. Ask your health care provider how often you should have your eyes and teeth checked. Stay up to date on all vaccines. This information is not intended to replace advice given to you by your health care provider. Make sure you discuss any questions you have with your  health care provider. Document Revised: 06/02/2021 Document Reviewed: 06/02/2021 Elsevier Patient Education  2024 [employer].

## 2024-04-23 NOTE — Progress Notes (Signed)
 Hematology/Oncology Consult note [facility]  Telephone:([phone] [phone]  Patient Care Team: Adeline Hone, PA-C as PCP - General (Family Medicine) Waverly Hageman, RN as Oncology Nurse Navigator Avonne Boettcher, MD as Consulting Physician (Oncology) Conrado Delay, DO as Consulting Physician (General Surgery)   Name of the patient: Joy Ray  [id number]  12/13/1966   Date of visit: 04/23/24  Diagnosis-  Pathologic prognostic stage Ia invasive mammary carcinoma of the right breast pT1 aN0 M0 ER/PR positive HER2 negative s/p right mastectomy    Chief complaint/ Reason for visit-routine follow-up of breast cancer on Arimidex   Heme/Onc history: patient is a 58 year old female who underwent a bilateral screening mammogram in August 2023 which showed a possible distortion in the right breast.  This was followed by a diagnostic mammogram and ultrasound which showed a 3 x 3 x 3 mm irregular hypoechoic mass in the right breast.  No suspicious right axillary adenopathy.  This was biopsied and was consistent with a 5 mm invasive mammary carcinoma grade 1 ER greater than 90% positive PR greater than 90% positive and HER2 negative.   Patient is currently on home peritoneal dialysis 7 days a week.  Her mother was also diagnosed with breast cancer and had a negative genetic testing.  She was on progesterone injections in the past and has not had any return of cycles after stopping those injections.    Patient underwent a right mastectomy with Dr. Marquita Situ in October 2023 as she did not want to take any adjuvant radiation.  Final pathology showed 5 mm grade 1 invasive mammary carcinoma with negative margins.  5 sentinel lymph nodes negative for malignancy she did not require any adjuvant chemotherapy.  She was started on Arimidex  in November 2023.  Baseline bone density scan normal.  Interval history-denies any breast concerns at this time.  She is tolerating  Arimidex  along with calcium  and vitamin D well without any significant side effects.  ECOG PS- 1 Pain scale- 0   Review of systems- Review of Systems  Constitutional:  Positive for malaise/fatigue. Negative for chills, fever and weight loss.  HENT:  Negative for congestion, ear discharge and nosebleeds.   Eyes:  Negative for blurred vision.  Respiratory:  Negative for cough, hemoptysis, sputum production, shortness of breath and wheezing.   Cardiovascular:  Negative for chest pain, palpitations, orthopnea and claudication.  Gastrointestinal:  Negative for abdominal pain, blood in stool, constipation, diarrhea, heartburn, melena, nausea and vomiting.  Genitourinary:  Negative for dysuria, flank pain, frequency, hematuria and urgency.  Musculoskeletal:  Negative for back pain, joint pain and myalgias.  Skin:  Negative for rash.  Neurological:  Negative for dizziness, tingling, focal weakness, seizures, weakness and headaches.  Endo/Heme/Allergies:  Does not bruise/bleed easily.  Psychiatric/Behavioral:  Negative for depression and suicidal ideas. The patient does not have insomnia.       Allergies  Allergen Reactions   Ciprofloxacin     Pt denies     Past Medical History:  Diagnosis Date   Acute kidney injury (HCC)    Allergy    Cigarette smoker    HTN, goal below 140/90    Malignant neoplasm of upper-outer quadrant of right breast in female, estrogen receptor positive (HCC) 09/07/2022   a.) CNB 09/07/2022 --> pathology (+) for IMC (G1, ER/PR +, HER2/neu -)   Second degree hemorrhoids      Past Surgical History:  Procedure Laterality Date   BREAST BIOPSY Right 09/07/2022  u/s bx coil clip --> pathology (+) for IMC (G1, ER/PR +, HER2/neu -)   CAPD INSERTION N/A 11/02/2021   Procedure: LAPAROSCOPIC INSERTION CONTINUOUS AMBULATORY PERITONEAL DIALYSIS  (CAPD) CATHETER;  Surgeon: Alben Alma, MD;  Location: [facility] ORS;  Service: General;  Laterality: N/A;   COLONOSCOPY WITH  PROPOFOL  N/A 04/21/2016   Procedure: COLONOSCOPY WITH PROPOFOL ;  Surgeon: Marnee Sink, MD;  Location: [facility];  Service: Endoscopy;  Laterality: N/A;   INSERTION OF MESH  11/02/2021   Procedure: INSERTION OF MESH;  Surgeon: Alben Alma, MD;  Location: [facility] ORS;  Service: General;;   MASTECTOMY     SIMPLE MASTECTOMY WITH AXILLARY SENTINEL NODE BIOPSY Right 10/10/2022   Procedure: SIMPLE MASTECTOMY WITH AXILLARY SENTINEL NODE BIOPSY;  Surgeon: Marshall Skeeter, MD;  Location: [facility] ORS;  Service: General;  Laterality: Right;   TUBAL LIGATION  12/19/1992   After G1P1001    Social History   Socioeconomic History   Marital status: Single    Spouse name: Not on file   Number of children: 1   Years of education: Not on file   Highest education level: Not on file  Occupational History   Not on file  Tobacco Use   Smoking status: Light Smoker    Current packs/day: 0.25    Average packs/day: 0.3 packs/day for 20.0 years (5.0 ttl pk-yrs)    Types: Cigarettes   Smokeless tobacco: Never   Tobacco comments:    pt has cut back to 1 or 2 cigs/day  Vaping Use   Vaping status: Never Used  Substance and Sexual Activity   Alcohol use: Yes    Comment: Rare   Drug use: No   Sexual activity: Not Currently  Other Topics Concern   Not on file  Social History Narrative   Not on file   Social Drivers of Health   Financial Resource Strain: Low Risk  (03/30/2023)   Overall Financial Resource Strain (CARDIA)    Difficulty of Paying Living Expenses: Not hard at all  Food Insecurity: No Food Insecurity (02/20/2024)   Hunger Vital Sign    Worried About Running Out of Food in the Last Year: Never true    Ran Out of Food in the Last Year: Never true  Transportation Needs: No Transportation Needs (02/20/2024)   PRAPARE - Administrator, Civil Service (Medical): No    Lack of Transportation (Non-Medical): No  Physical Activity: Inactive (03/30/2023)   Exercise Vital Sign     Days of Exercise per Week: 0 days    Minutes of Exercise per Session: 0 min  Stress: No Stress Concern Present (03/30/2023)   [employer] of Occupational Health - Occupational Stress Questionnaire    Feeling of Stress : Only a little  Social Connections: Moderately Integrated (03/30/2023)   Social Connection and Isolation Panel [NHANES]    Frequency of Communication with Friends and Family: More than three times a week    Frequency of Social Gatherings with Friends and Family: More than three times a week    Attends Religious Services: 1 to 4 times per year    Active Member of [employer] or Organizations: Yes    Attends Banker Meetings: Never    Marital Status: Never married  Intimate Partner Violence: Not At Risk (02/20/2024)   Humiliation, Afraid, Rape, and Kick questionnaire    Fear of Current or Ex-Partner: No    Emotionally Abused: No    Physically Abused: No  Sexually Abused: No    Family History  Problem Relation Age of Onset   Cancer Mother        breast   Breast cancer Mother 55   Pancreatic cancer Mother        neg [employer] MCA genetic testing 2021   Breast cancer Maternal Aunt    Breast cancer Maternal Aunt      Current Outpatient Medications:    anastrozole  (ARIMIDEX ) 1 MG tablet, Take 1 tablet (1 mg total) by mouth daily., Disp: 90 tablet, Rfl: 0   calcium  acetate, Phos Binder, (PHOSLYRA) 667 MG/5ML SOLN, Take by mouth 3 (three) times daily with meals., Disp: , Rfl:    fluticasone  (FLONASE ) 50 MCG/ACT nasal spray, SHAKE LIQUID AND USE 2 SPRAYS IN EACH NOSTRIL EVERY DAY, Disp: 16 g, Rfl: 3   furosemide  (LASIX ) 80 MG tablet, Take 25 mg by mouth daily., Disp: , Rfl:    gentamicin cream (GARAMYCIN) 0.1 %, Apply topically daily., Disp: , Rfl:    potassium chloride  SA (KLOR-CON  M) 20 MEQ tablet, Take 20 mEq by mouth 2 (two) times daily., Disp: , Rfl:    rosuvastatin  (CRESTOR ) 5 MG tablet, TAKE 1 TABLET BY MOUTH DAILY, Disp: 90 tablet, Rfl: 1    HYDROcodone -acetaminophen  (NORCO/VICODIN) 5-325 MG tablet, 1/2 tablet every 4-6 hours if needed for pain. (Patient not taking: Reported on 04/23/2024), Disp: 15 tablet, Rfl: 0  Physical exam:  Vitals:   04/23/24 1359  Weight: 120 lb (54.4 kg)   Physical Exam Cardiovascular:     Rate and Rhythm: Normal rate and regular rhythm.     Heart sounds: Normal heart sounds.  Pulmonary:     Effort: Pulmonary effort is normal.     Breath sounds: Normal breath sounds.  Abdominal:     General: Bowel sounds are normal.     Palpations: Abdomen is soft.     Comments: Peritoneal dialysis catheter in place  Skin:    General: Skin is warm and dry.  Neurological:     Mental Status: She is alert and oriented to person, place, and time.   Breast exam: Patient is s/p right mastectomy without reconstruction.  I have personally reviewed labs listed below:    Latest Ref Rng & Units 07/31/2023    2:57 PM  CMP  Glucose 70 - 99 mg/dL 94   BUN 6 - 20 mg/dL 35   Creatinine 1.61 - 1.00 mg/dL 0.96   Sodium 045 - 409 mmol/L 135   Potassium 3.5 - 5.1 mmol/L 3.3   Chloride 98 - 111 mmol/L 99   CO2 22 - 32 mmol/L 27   Calcium  8.9 - 10.3 mg/dL 8.7   Total Protein 6.5 - 8.1 g/dL 6.4   Total Bilirubin 0.3 - 1.2 mg/dL 0.7   Alkaline Phos 38 - 126 U/L 83   AST 15 - 41 U/L 16   ALT 0 - 44 U/L 13       Latest Ref Rng & Units 10/24/2023    4:09 PM  CBC  WBC 4.0 - 10.5 K/uL 7.5   Hemoglobin 12.0 - 15.0 g/dL 81.1   Hematocrit 91.4 - 46.0 % 42.9   Platelets 150 - 400 K/uL 270     Assessment and plan- Patient is a 58 y.o. female with history of stage I right breast cancer ER/PR positive HER2 negative s/p right mastectomy here for routine follow-up  Patient has been on Arimidex  since November 2023 and is tolerating it well without any significant side  effects.  She will continue with arimidex  calcium  and vit D at this time. Clinically she is doing well With no concerning signs and symptoms of recurrence based on  today's exam.  I will schedule her mammogram for September 2025.  Her last bone density scan from January 2024 was normal and we will plan to get one done next year.  I will see her back in 6 months no labs   Visit Diagnosis 1. Encounter for follow-up surveillance of breast cancer   2. Visit for monitoring Arimidex  therapy   3. High risk medication use      Dr. Seretha Dance, MD, MPH [facility] [phone] 04/23/2024 2:01 PM

## 2024-04-23 NOTE — Progress Notes (Signed)
 Patient: Joy Ray, Female    DOB: Jul 19, 1966, 58 y.o.   MRN: [id number] Adeline Hone, PA-C Visit Date: 04/23/2024  Today's Provider: Adeline Hone, PA-C   Chief Complaint  Patient presents with   Annual Exam   Subjective:   Annual physical exam:  Joy Ray is a 58 y.o. female who presents today for complete physical exam:  SDOH Screenings   Food Insecurity: No Food Insecurity (02/20/2024)  Housing: Low Risk  (02/20/2024)  Transportation Needs: No Transportation Needs (02/20/2024)  Utilities: Not At Risk (02/20/2024)  Alcohol Screen: Low Risk  (04/23/2024)  Depression (PHQ2-9): Low Risk  (04/23/2024)  Financial Resource Strain: Low Risk  (04/23/2024)  Physical Activity: Sufficiently Active (04/23/2024)  Social Connections: Moderately Integrated (04/23/2024)  Stress: No Stress Concern Present (04/23/2024)  Tobacco Use: High Risk (04/23/2024)  Health Literacy: Adequate Health Literacy (04/23/2024)    USPSTF grade A and B recommendations - reviewed and addressed today  Depression:  Phq 9 completed today by patient, was reviewed by me with patient in the room PHQ score is negative    04/23/2024    3:24 PM 03/30/2023    2:52 PM 05/27/2022    1:28 PM 03/28/2022    2:45 PM  PHQ 2/9 Scores  PHQ - 2 Score 0 0 0 0  PHQ- 9 Score  0 0 0      04/23/2024    3:24 PM 03/30/2023    2:52 PM 05/27/2022    1:28 PM 03/28/2022    2:45 PM 10/26/2021    1:42 PM  Depression screen PHQ 2/9  Decreased Interest 0 0 0 0 0  Down, Depressed, Hopeless 0 0 0 0 0  PHQ - 2 Score 0 0 0 0 0  Altered sleeping  0 0 0 0  Tired, decreased energy  0 0 0 0  Change in appetite  0 0 0 0  Feeling bad or failure about yourself   0 0 0 0  Trouble concentrating  0 0 0 0  Moving slowly or fidgety/restless  0 0 0 0  Suicidal thoughts  0 0 0 0  PHQ-9 Score  0 0 0 0  Difficult doing work/chores  Not difficult at all Not difficult at all Not difficult at all     Alcohol screening: Flowsheet Row Office Visit  from 04/23/2024 in [facility]  AUDIT-C Score 1       Immunizations and Health Maintenance: Health Maintenance  Topic Date Due   Medicare Annual Wellness (AWV)  Never done   COVID-19 Vaccine (2 - Janssen risk series) 05/09/2024 (Originally 04/27/2020)   DTaP/Tdap/Td (2 - Tdap) 04/23/2025 (Originally 02/02/2003)   INFLUENZA VACCINE  07/19/2024   MAMMOGRAM  08/28/2024   Cervical Cancer Screening (HPV/Pap Cotest)  11/11/2024   Colonoscopy  04/21/2026   Pneumococcal Vaccine 27-37 Years old  Completed   Hepatitis C Screening  Completed   HIV Screening  Completed   Zoster Vaccines- Shingrix  Completed   HPV VACCINES  Aged Out   Meningococcal B Vaccine  Aged Out     Hep C Screening: done  STD testing and prevention (HIV/chl/gon/syphilis): not sexually active, declines/low risk and done in the past  Intimate partner violence:denies  Sexual History/Pain during Intercourse: Single  Menstrual History/LMP/Abnormal Bleeding: none No LMP recorded. Patient is postmenopausal.  Incontinence Symptoms: denies  Breast cancer: per oncology Last Mammogram: *see HM list above  Cervical cancer screening: due today  Osteoporosis:  dexa last year Discussion on osteoporosis per age, including high calcium  and vitamin D supplementation, weight bearing exercises  Skin cancer:  Hx of skin CA -  NO Discussed atypical lesions   Colorectal cancer:   Colonoscopy is UTD Discussed concerning signs and sx of CRC, pt denies  Lung cancer:  may qualify but she did not want to discuss in depth her smoking hx so I could add up pack year hx  Low Dose CT Chest recommended if Age 32-80 years, 20 pack-year currently smoking OR have quit w/in 15years.     Social History   Tobacco Use   Smoking status: Light Smoker    Current packs/day: 0.25    Average packs/day: 0.3 packs/day for 20.0 years (5.0 ttl pk-yrs)    Types: Cigarettes   Smokeless tobacco: Never   Tobacco comments:     pt has cut back to 1 or 2 cigs/day  Vaping Use   Vaping status: Never Used  Substance Use Topics   Alcohol use: Yes    Comment: Rare   Drug use: No     Flowsheet Row Office Visit from 04/23/2024 in [facility]  AUDIT-C Score 1       Family History  Problem Relation Age of Onset   Cancer Mother        breast   Breast cancer Mother 68   Pancreatic cancer Mother        neg [employer] MCA genetic testing 2021   Breast cancer Maternal Aunt    Breast cancer Maternal Aunt      Blood pressure/Hypertension: BP Readings from Last 3 Encounters:  04/23/24 118/70  04/23/24 (!) 129/103  10/24/23 107/86    Weight/Obesity: Wt Readings from Last 3 Encounters:  04/23/24 120 lb (54.4 kg)  04/23/24 120 lb (54.4 kg)  10/24/23 119 lb 1.6 oz (54 kg)   BMI Readings from Last 3 Encounters:  04/23/24 18.25 kg/m  04/23/24 18.25 kg/m  10/24/23 18.11 kg/m     Lipids:  Lab Results  Component Value Date   CHOL 215 (H) 03/30/2023   CHOL 196 10/26/2021   CHOL 165 05/21/2018   Lab Results  Component Value Date   HDL 59 03/30/2023   HDL 49 10/26/2021   HDL 55 05/21/2018   Lab Results  Component Value Date   LDLCALC 129 (H) 03/30/2023   LDLCALC 121 (H) 10/26/2021   LDLCALC 91 05/21/2018   Lab Results  Component Value Date   TRIG 153 (H) 03/30/2023   TRIG 148 10/26/2021   TRIG 93 05/21/2018   Lab Results  Component Value Date   CHOLHDL 3.6 03/30/2023   CHOLHDL 4.0 10/26/2021   CHOLHDL 2.9 04/10/2017   No results found for: "LDLDIRECT" Based on the results of lipid panel his/her cardiovascular risk factor ( using Poole Cohort )  in the next 10 years is: The 10-year ASCVD risk score (Arnett DK, et al., 2019) is: 7.9%   Values used to calculate the score:     Age: 64 years     Sex: Female     Is Non-Hispanic African American: Yes     Diabetic: No     Tobacco smoker: Yes     Systolic Blood Pressure: 118 mmHg     Is BP treated: Yes     HDL  Cholesterol: 59 mg/dL     Total Cholesterol: 215 mg/dL  Glucose:  Glucose, Bld  Date Value Ref Range Status  07/31/2023 94 70 -  99 mg/dL Final    Comment:    Glucose reference range applies only to samples taken after fasting for at least 8 hours.  03/30/2023 77 65 - 99 mg/dL Final    Comment:    .            Fasting reference interval .   01/25/2023 90 70 - 99 mg/dL Final    Comment:    Glucose reference range applies only to samples taken after fasting for at least 8 hours.   Glucose-Capillary  Date Value Ref Range Status  10/27/2021 103 (H) 70 - 99 mg/dL Final    Comment:    Glucose reference range applies only to samples taken after fasting for at least 8 hours.  10/27/2021 94 70 - 99 mg/dL Final    Comment:    Glucose reference range applies only to samples taken after fasting for at least 8 hours.      Social History       Social History   Socioeconomic History   Marital status: Single    Spouse name: Not on file   Number of children: 1   Years of education: Not on file   Highest education level: Not on file  Occupational History   Not on file  Tobacco Use   Smoking status: Light Smoker    Current packs/day: 0.25    Average packs/day: 0.3 packs/day for 20.0 years (5.0 ttl pk-yrs)    Types: Cigarettes   Smokeless tobacco: Never   Tobacco comments:    pt has cut back to 1 or 2 cigs/day  Vaping Use   Vaping status: Never Used  Substance and Sexual Activity   Alcohol use: Yes    Comment: Rare   Drug use: No   Sexual activity: Not Currently  Other Topics Concern   Not on file  Social History Narrative   Not on file   Social Drivers of Health   Financial Resource Strain: Low Risk  (04/23/2024)   Overall Financial Resource Strain (CARDIA)    Difficulty of Paying Living Expenses: Not hard at all  Food Insecurity: No Food Insecurity (02/20/2024)   Hunger Vital Sign    Worried About Running Out of Food in the Last Year: Never true    Ran Out of Food in  the Last Year: Never true  Transportation Needs: No Transportation Needs (02/20/2024)   PRAPARE - Administrator, Civil Service (Medical): No    Lack of Transportation (Non-Medical): No  Physical Activity: Sufficiently Active (04/23/2024)   Exercise Vital Sign    Days of Exercise per Week: 6 days    Minutes of Exercise per Session: 30 min  Stress: No Stress Concern Present (04/23/2024)   [employer] of Occupational Health - Occupational Stress Questionnaire    Feeling of Stress : Not at all  Social Connections: Moderately Integrated (04/23/2024)   Social Connection and Isolation Panel [NHANES]    Frequency of Communication with Friends and Family: More than three times a week    Frequency of Social Gatherings with Friends and Family: More than three times a week    Attends Religious Services: 1 to 4 times per year    Active Member of [employer] or Organizations: Yes    Attends Banker Meetings: Never    Marital Status: Never married    Family History        Family History  Problem Relation Age of Onset   Cancer Mother  breast   Breast cancer Mother 4   Pancreatic cancer Mother        neg [employer] MCA genetic testing 2021   Breast cancer Maternal Aunt    Breast cancer Maternal Aunt     Patient Active Problem List   Diagnosis Date Noted   Malignant neoplasm of upper-outer quadrant of right breast in female, estrogen receptor positive (HCC) 09/14/2022   Goals of care, counseling/discussion 09/14/2022   History of tobacco abuse 06/26/2022   Awaiting organ transplant status 06/26/2022   ESRD on peritoneal dialysis (HCC) 03/29/2022   Dysmenorrhea 03/29/2022   Protein-calorie malnutrition, severe 11/01/2021   Anemia due to folic acid  deficiency    B12 deficiency    Thrombocytopenia (HCC)    Anemia in chronic kidney disease, on chronic dialysis (HCC)    Acute respiratory failure with hypoxia (HCC) 10/26/2021   History of smoking 10-25 pack years  04/18/2018   Allergic rhinitis 10/13/2015   H/O abnormal cervical Papanicolaou smear 10/13/2015   Hypertension goal BP (blood pressure) < 140/90 10/13/2015    Past Surgical History:  Procedure Laterality Date   BREAST BIOPSY Right 09/07/2022   u/s bx coil clip --> pathology (+) for IMC (G1, ER/PR +, HER2/neu -)   CAPD INSERTION N/A 11/02/2021   Procedure: LAPAROSCOPIC INSERTION CONTINUOUS AMBULATORY PERITONEAL DIALYSIS  (CAPD) CATHETER;  Surgeon: Alben Alma, MD;  Location: [facility];  Service: General;  Laterality: N/A;   COLONOSCOPY WITH PROPOFOL  N/A 04/21/2016   Procedure: COLONOSCOPY WITH PROPOFOL ;  Surgeon: Marnee Sink, MD;  Location: [facility];  Service: Endoscopy;  Laterality: N/A;   INSERTION OF MESH  11/02/2021   Procedure: INSERTION OF MESH;  Surgeon: Alben Alma, MD;  Location: [facility];  Service: General;;   MASTECTOMY     SIMPLE MASTECTOMY WITH AXILLARY SENTINEL NODE BIOPSY Right 10/10/2022   Procedure: SIMPLE MASTECTOMY WITH AXILLARY SENTINEL NODE BIOPSY;  Surgeon: Marshall Skeeter, MD;  Location: [facility];  Service: General;  Laterality: Right;   TUBAL LIGATION  12/19/1992   After [id number]     Current Outpatient Medications:    anastrozole  (ARIMIDEX ) 1 MG tablet, Take 1 tablet (1 mg total) by mouth daily., Disp: 90 tablet, Rfl: 0   calcium  acetate, Phos Binder, (PHOSLYRA) 667 MG/5ML SOLN, Take by mouth 3 (three) times daily with meals., Disp: , Rfl:    fluticasone  (FLONASE ) 50 MCG/ACT nasal spray, SHAKE LIQUID AND USE 2 SPRAYS IN EACH NOSTRIL EVERY DAY, Disp: 16 g, Rfl: 3   furosemide  (LASIX ) 80 MG tablet, Take 25 mg by mouth daily., Disp: , Rfl:    gentamicin cream (GARAMYCIN) 0.1 %, Apply topically daily., Disp: , Rfl:    nystatin-triamcinolone ointment (MYCOLOG), Apply 1 Application topically 2 (two) times daily as needed (to affected itchy skin). For up to 2 weeks. Do not apply to genitals or face, Disp: 30 g, Rfl: 2   potassium chloride  SA (KLOR-CON   M) 20 MEQ tablet, Take 20 mEq by mouth 2 (two) times daily., Disp: , Rfl:    HYDROcodone -acetaminophen  (NORCO/VICODIN) 5-325 MG tablet, 1/2 tablet every 4-6 hours if needed for pain. (Patient not taking: Reported on 08/02/2023), Disp: 15 tablet, Rfl: 0   rosuvastatin  (CRESTOR ) 5 MG tablet, Take 1 tablet (5 mg total) by mouth daily., Disp: 90 tablet, Rfl: 3  Allergies  Allergen Reactions   Ciprofloxacin     Pt denies    Patient Care Team: Garnett Rekowski, PA-C as PCP - General (Family Medicine) Waverly Hageman,  RN as Oncology Nurse Navigator Avonne Boettcher, MD as Consulting Physician (Oncology) Conrado Delay, DO as Consulting Physician (General Surgery)   Chart Review: I personally reviewed active problem list, medication list, allergies, family history, social history, health maintenance, notes from last encounter, lab results, imaging with the patient/caregiver today.   Review of Systems  Constitutional: Negative.   HENT: Negative.    Eyes: Negative.   Respiratory: Negative.    Cardiovascular: Negative.   Gastrointestinal: Negative.   Endocrine: Negative.   Genitourinary: Negative.   Musculoskeletal: Negative.   Skin: Negative.   Allergic/Immunologic: Negative.   Neurological: Negative.   Hematological: Negative.   Psychiatric/Behavioral: Negative.    All other systems reviewed and are negative.         Objective:   Vitals:  Vitals:   04/23/24 1524  BP: 118/70  Pulse: 81  Resp: 16  Weight: 120 lb (54.4 kg)  Height: 5\' 8"  (1.727 m)    Body mass index is 18.25 kg/m.  Physical Exam Vitals and nursing note reviewed. Exam conducted with a chaperone present.  Constitutional:      General: She is not in acute distress.    Appearance: Normal appearance. She is well-developed and normal weight. She is not ill-appearing, toxic-appearing or diaphoretic.  HENT:     Head: Normocephalic and atraumatic.     Right Ear: Tympanic membrane, ear canal and external ear normal. There  is no impacted cerumen.     Left Ear: Tympanic membrane, ear canal and external ear normal. There is no impacted cerumen.     Nose: Nose normal. No congestion or rhinorrhea.     Mouth/Throat:     Mouth: Mucous membranes are moist.     Pharynx: No oropharyngeal exudate or posterior oropharyngeal erythema.  Eyes:     General: No scleral icterus.       Right eye: No discharge.        Left eye: No discharge.     Conjunctiva/sclera: Conjunctivae normal.  Neck:     Trachea: No tracheal deviation.  Cardiovascular:     Rate and Rhythm: Normal rate and regular rhythm.     Pulses: Normal pulses.     Heart sounds: Normal heart sounds.  Pulmonary:     Effort: Pulmonary effort is normal. No respiratory distress.     Breath sounds: Normal breath sounds. No stridor. No wheezing, rhonchi or rales.  Abdominal:     General: Bowel sounds are normal.     Palpations: Abdomen is soft.     Comments: PD tube dressing intact and tubing secured with elastic band  Genitourinary:    General: Normal vulva.     Cervix: Normal.     Uterus: Normal.      Adnexa: Right adnexa normal and left adnexa normal.     Comments: PAP/HPV obtained with broom Skin:    General: Skin is warm and dry.     Findings: Erythema and rash present.  Neurological:     Mental Status: She is alert.     Motor: No abnormal muscle tone.     Coordination: Coordination normal.  Psychiatric:        Behavior: Behavior normal.       Fall Risk:    03/30/2023    2:52 PM 05/27/2022    1:28 PM 03/28/2022    2:45 PM 10/26/2021    1:41 PM 03/24/2021    3:21 PM  Fall Risk   Falls in the past year? 0 0 0  0 0  Number falls in past yr: 0 0 0 0 0  Injury with Fall? 0 0 0 0 0  Risk for fall due to : No Fall Risks No Fall Risks No Fall Risks No Fall Risks   Follow up Falls prevention discussed;Education provided;Falls evaluation completed Falls prevention discussed;Education provided Education provided;Falls prevention discussed Falls prevention  discussed Falls evaluation completed    Functional Status Survey:     Assessment & Plan:    CPE completed today  USPSTF grade A and B recommendations reviewed with patient; age-appropriate recommendations, preventive care, screening tests, etc discussed and encouraged; healthy living encouraged; see AVS for patient education given to patient  Discussed importance of 150 minutes of physical activity weekly, AHA exercise recommendations given to pt in AVS/handout  Discussed importance of healthy diet:  eating lean meats and proteins, avoiding trans fats and saturated fats, avoid simple sugars and excessive carbs in diet, eat 6 servings of fruit/vegetables daily and drink plenty of water  and avoid sweet beverages.    Recommended pt to do annual eye exam and routine dental exams/cleanings  Depression, alcohol, fall screening completed as documented above and per flowsheets  Advance Care planning information and packet discussed and offered today, encouraged pt to discuss with family members/spouse/partner/friends and complete Advanced directive packet and bring copy to office   Reviewed Health Maintenance: Health Maintenance  Topic Date Due   Medicare Annual Wellness (AWV)  Never done   COVID-19 Vaccine (2 - Janssen risk series) 05/09/2024 (Originally 04/27/2020)   DTaP/Tdap/Td (2 - Tdap) 04/23/2025 (Originally 02/02/2003)   INFLUENZA VACCINE  07/19/2024   MAMMOGRAM  08/28/2024   Cervical Cancer Screening (HPV/Pap Cotest)  11/11/2024   Colonoscopy  04/21/2026   Pneumococcal Vaccine 51-44 Years old  Completed   Hepatitis C Screening  Completed   HIV Screening  Completed   Zoster Vaccines- Shingrix  Completed   HPV VACCINES  Aged Out   Meningococcal B Vaccine  Aged Out        ICD-10-CM   1. Well adult exam  Z00.00     2. Cervical cancer screening  Z12.4 Cytology - PAP    3. Mixed hyperlipidemia  E78.2 rosuvastatin  (CRESTOR ) 5 MG tablet    Lipid panel    4. Pruritic  condition  L29.9 nystatin-triamcinolone ointment (MYCOLOG)          Adeline Hone, PA-C 04/23/24 4:36 PM  [facility]

## 2024-04-25 ENCOUNTER — Encounter: Payer: Self-pay | Admitting: Family Medicine

## 2024-04-25 LAB — CYTOLOGY - PAP
Comment: NEGATIVE
Diagnosis: NEGATIVE
High risk HPV: NEGATIVE

## 2024-05-03 NOTE — Telephone Encounter (Signed)
 Called patient to schedule AWV. No answer. Left vm with direct dial info. [phone])  Joy Ray

## 2024-05-03 NOTE — Telephone Encounter (Signed)
 Scheduled patient for AWV on June 20 since we keep missing each others phone call.

## 2024-06-07 ENCOUNTER — Ambulatory Visit

## 2024-08-29 ENCOUNTER — Ambulatory Visit
Admission: RE | Admit: 2024-08-29 | Discharge: 2024-08-29 | Disposition: A | Discharge status: Home / Self Care | Source: Ambulatory Visit | Attending: Oncology | Admitting: Oncology

## 2024-08-29 DIAGNOSIS — Z9011 Acquired absence of right breast and nipple: Secondary | ICD-10-CM | POA: Diagnosis present

## 2024-08-29 DIAGNOSIS — Z853 Personal history of malignant neoplasm of breast: Secondary | ICD-10-CM | POA: Insufficient documentation

## 2024-08-29 DIAGNOSIS — Z1231 Encounter for screening mammogram for malignant neoplasm of breast: Secondary | ICD-10-CM | POA: Diagnosis not present

## 2024-08-29 DIAGNOSIS — Z08 Encounter for follow-up examination after completed treatment for malignant neoplasm: Secondary | ICD-10-CM | POA: Diagnosis present

## 2024-10-23 ENCOUNTER — Inpatient Hospital Stay: Admitting: Oncology

## 2024-11-18 ENCOUNTER — Inpatient Hospital Stay: Admitting: Oncology

## 2025-04-28 ENCOUNTER — Encounter: Admitting: Family Medicine
